# Patient Record
Sex: Male | Born: 1940
Health system: Southern US, Community
[De-identification: ages and names within clinical notes are randomized; demographics above are authoritative.]

## PROBLEM LIST (undated history)

## (undated) ENCOUNTER — Emergency Department (HOSPITAL_COMMUNITY)

## (undated) DIAGNOSIS — R42 Dizziness and giddiness: Secondary | ICD-10-CM

## (undated) DIAGNOSIS — N200 Calculus of kidney: Secondary | ICD-10-CM

## (undated) DIAGNOSIS — I442 Atrioventricular block, complete: Principal | ICD-10-CM

## (undated) DIAGNOSIS — E785 Hyperlipidemia, unspecified: Secondary | ICD-10-CM

## (undated) DIAGNOSIS — I10 Essential (primary) hypertension: Secondary | ICD-10-CM

## (undated) DIAGNOSIS — E119 Type 2 diabetes mellitus without complications: Secondary | ICD-10-CM

## (undated) DIAGNOSIS — Z950 Presence of cardiac pacemaker: Secondary | ICD-10-CM

## (undated) HISTORY — DX: Essential (primary) hypertension: I10

## (undated) HISTORY — PX: PACEMAKER PLACEMENT: SHX43

## (undated) HISTORY — DX: Type 2 diabetes mellitus without complications: E11.9

## (undated) HISTORY — DX: Hyperlipidemia, unspecified: E78.5

## (undated) HISTORY — PX: APPENDECTOMY: SHX54

## (undated) HISTORY — DX: Calculus of kidney: N20.0

## (undated) HISTORY — DX: Presence of cardiac pacemaker: Z95.0

## (undated) HISTORY — DX: Dizziness and giddiness: R42

## (undated) HISTORY — DX: Atrioventricular block, complete: I44.2

---

## 2001-04-18 ENCOUNTER — Ambulatory Visit (HOSPITAL_COMMUNITY): Admission: RE | Admit: 2001-04-18 | Discharge: 2001-04-18 | Payer: Self-pay | Admitting: Gastroenterology

## 2007-06-12 ENCOUNTER — Ambulatory Visit (HOSPITAL_BASED_OUTPATIENT_CLINIC_OR_DEPARTMENT_OTHER): Admission: RE | Admit: 2007-06-12 | Discharge: 2007-06-12 | Payer: Self-pay | Admitting: Orthopedic Surgery

## 2008-04-20 ENCOUNTER — Emergency Department (HOSPITAL_COMMUNITY): Admission: EM | Admit: 2008-04-20 | Discharge: 2008-04-20 | Payer: Self-pay | Admitting: Emergency Medicine

## 2008-08-24 ENCOUNTER — Ambulatory Visit: Payer: Self-pay | Admitting: Cardiology

## 2008-08-24 ENCOUNTER — Inpatient Hospital Stay (HOSPITAL_COMMUNITY): Admission: EM | Admit: 2008-08-24 | Discharge: 2008-08-27 | Payer: Self-pay | Admitting: Emergency Medicine

## 2008-08-25 ENCOUNTER — Encounter: Payer: Self-pay | Admitting: Cardiology

## 2008-09-04 ENCOUNTER — Ambulatory Visit: Payer: Self-pay

## 2008-09-10 ENCOUNTER — Ambulatory Visit: Payer: Self-pay

## 2008-09-10 ENCOUNTER — Encounter: Payer: Self-pay | Admitting: Internal Medicine

## 2008-09-16 ENCOUNTER — Ambulatory Visit: Payer: Self-pay | Admitting: Cardiology

## 2008-11-04 ENCOUNTER — Encounter: Payer: Self-pay | Admitting: Internal Medicine

## 2008-11-04 ENCOUNTER — Ambulatory Visit: Payer: Self-pay | Admitting: Internal Medicine

## 2009-02-16 ENCOUNTER — Encounter: Payer: Self-pay | Admitting: Internal Medicine

## 2009-03-30 ENCOUNTER — Ambulatory Visit: Payer: Self-pay | Admitting: Cardiology

## 2009-03-30 DIAGNOSIS — I442 Atrioventricular block, complete: Secondary | ICD-10-CM | POA: Insufficient documentation

## 2009-03-30 DIAGNOSIS — E663 Overweight: Secondary | ICD-10-CM | POA: Insufficient documentation

## 2009-03-30 HISTORY — DX: Atrioventricular block, complete: I44.2

## 2009-05-13 ENCOUNTER — Ambulatory Visit: Payer: Self-pay

## 2009-05-13 ENCOUNTER — Encounter: Payer: Self-pay | Admitting: Internal Medicine

## 2009-11-10 ENCOUNTER — Ambulatory Visit: Payer: Self-pay | Admitting: Internal Medicine

## 2010-02-16 ENCOUNTER — Encounter: Payer: Self-pay | Admitting: Internal Medicine

## 2010-09-02 NOTE — Letter (Signed)
Summary: Device-Delinquent Phone Journalist, newspaper, Main Office  [address]   [place], [place] [address]   Phone: [phone]  Fax: [phone]     February 16, 2010 MRN: [id number]   Nathan [name] [address] [place], [place]  [address]   Dear Nathan [name],  According to our records, you were scheduled for a device phone transmission on 02-11-2010.     We did not receive any results from this check.  If you transmitted on your scheduled day, please call us to help troubleshoot your system.  If you forgot to send your transmission, please send one upon receipt of this letter.  Thank you,   [facility]

## 2010-09-02 NOTE — Cardiovascular Report (Signed)
Summary: Office Visit   Office Visit   Imported By: Roderic Ovens 11/10/2009 15:15:12  _____________________________________________________________________  External Attachment:    Type:   Image     Comment:   External Document

## 2010-09-02 NOTE — Assessment & Plan Note (Signed)
Summary: pacer check/medtronic   Primary Provider:  Dr. Leslie Dales  CC:  pacer check/medtronic.  History of Present Illness:  [name] is seen in followup for pacemaker implantation for complete hear block undertaken in January 2010.   he has no complaints of lightheadedness or palpitations. The patient denies SOB, chest pain, edema or palpitations   He has a history of diabetes and hypertension  Current Medications (verified): 1)  Metformin Hcl 1000 Mg Tabs (Metformin Hcl) .... Two Times A Day 2)  Lipitor 40 Mg Tabs (Atorvastatin Calcium) .Marland Kitchen.. 1 Tab Once Daily 3)  Januvia 100 Mg Tabs (Sitagliptin Phosphate) .Marland Kitchen.. 1 Tab At Bedtime 4)  Adult Aspirin Ec Low Strength 81 Mg Tbec (Aspirin) .Marland Kitchen.. 1 Tablet Once Daily 5)  Benicar 20 Mg Tabs (Olmesartan Medoxomil) .... Take 1/2 By Mouth Once Daily 6)  Zetia 10 Mg Tabs (Ezetimibe) .... Every Pm 7)  Glimepiride 1 Mg Tabs (Glimepiride) .... Take One Tablet Daily  Allergies (verified): 1)  ! Sulfa 2)  ! * Niaspan  Past History:  Past Medical History: Last updated: 10/29/2008 Diabetes.  Hypertension.  Hyperlipidemia.  Remote history of nephrolithiasis.  History of vertigo.  Heart Block Medtronic ADAPTA dual-chamber  Past Surgical History: Last updated: 03/30/2009 Pacemaker Implantation--08/25/2008 Colonoscopy Appendectomy  Family History: Last updated: 10/29/2008 His mother is alive in her late 92s with no history of  heart disease and his father died in his 30s of cancer but also with no heart disease.  He is an only child.   Social History: Last updated: 10/29/2008 Retired --Semi retired/self-employed Married  Tobacco Use - No.  Alcohol Use - no  Vital Signs:  Patient profile:   70 year old male Height:      72 inches Weight:      215 pounds Pulse rate:   95 / minute Pulse rhythm:   regular BP sitting:   126 / 74  (left arm) Cuff size:   regular  Vitals Entered By: Judithe Modest CMA (November 10, 2009 9:54  AM)  Physical Exam  General:  The patient was alert and oriented in no acute distress. HEENT Normal.  Neck veins were flat, carotids were brisk.  Lungs were clear.  Heart sounds were regular without murmurs or gallops.  Abdomen was soft with active bowel sounds. There is no clubbing cyanosis or edema. Skin Warm and dry    PPM Specifications Following MD:  Sherryl Manges, MD     PPM Vendor:  Medtronic     PPM Model Number:  [id number]     PPM Serial Number:  [id number] PPM DOI:  08/25/2008     PPM Implanting MD:  Sherryl Manges, MD  Lead 1    Location: RA     DOI: 08/25/2008     Model #: [id number]     Serial #: [id number]     Status: active Lead 2    Location: RV     DOI: 08/25/2008     Model #: [id number]     Serial #: [id number]     Status: active  Magnet Response Rate:  BOL 85 ERI 65  Indications:  CHB   PPM Follow Up Remote Check?  No Battery Voltage:  2.79 V     Battery Est. Longevity:  14.5 years     Pacer Dependent:  No       PPM Device Measurements Atrium  Amplitude: 1.0 mV, Impedance: 611 ohms, Threshold: 0.5 V at 0.4 msec Right Ventricle  Amplitude: 16 mV, Impedance:  468 ohms, Threshold: 0.5 V at 0.4 msec Auto Capture ER/Polarity: 11  Episodes MS Episodes:  11     Percent Mode Switch:  <0.1%     Coumadin:  No Ventricular High Rate:  0     Atrial Pacing:  0.2%     Ventricular Pacing:  0.2%  Parameters Mode:  DDD     Lower Rate Limit:  60     Upper Rate Limit:  130 Paced AV Delay:  180     Sensed AV Delay:  150 Next Remote Date:  02/11/2010     Next Cardiology Appt Due:  10/31/2010 Tech Comments:  All mode switch episodes < 1 minute.  No parameter changes.  Device function normal.  Carelink transmissions every 3 months.  ROV 1 year with Dr. Graciela Husbands.  Checked by [employer]. Altha Harm, LPN  November 10, 2009 9:59 AM   Impression & Recommendations:  Problem # 1:  PACEMAKER, PERMANENT MDT (ICD-V45.01) Device parameters and data were reviewed and no changes were made  He is pacing  nearly 0 %   Problem # 2:  AV BLOCK, COMPLETE-INTERMITTENT (ICD-426.0) as noted above His updated medication list for this problem includes:    Adult Aspirin Ec Low Strength 81 Mg Tbec (Aspirin) .Marland Kitchen... 1 tablet once daily  Patient Instructions: 1)  Your physician wants you to follow-up in: 12 months with Dr Logan Bores will receive a reminder letter in the mail two months in advance. If you don't receive a letter, please call our office to schedule the follow-up appointment.

## 2010-11-15 LAB — BASIC METABOLIC PANEL
Calcium: 8.9 mg/dL (ref 8.4–10.5)
Creatinine, Ser: 0.77 mg/dL (ref 0.4–1.5)
GFR calc Af Amer: >60 mL/min (ref >60)

## 2010-11-15 LAB — COMPREHENSIVE METABOLIC PANEL
Albumin: 3.6 g/dL (ref 3.5–5.2)
Alkaline Phosphatase: 53 U/L (ref 39–117)
BUN: 42 mg/dL — ABNORMAL HIGH (ref 6–23)
Creatinine, Ser: 1.36 mg/dL (ref 0.4–1.5)
Glucose, Bld: 133 mg/dL — ABNORMAL HIGH (ref 70–99)
Potassium: 4.5 mEq/L (ref 3.5–5.1)
Total Protein: 5.8 g/dL — ABNORMAL LOW (ref 6.0–8.3)

## 2010-11-15 LAB — CARDIAC PANEL(CRET KIN+CKTOT+MB+TROPI)
CK, MB: 2 ng/mL (ref 0.3–4.0)
CK, MB: 2.6 ng/mL (ref 0.3–4.0)
Relative Index: INVALID (ref 0.0–2.5)
Total CK: 34 U/L (ref 7–232)
Total CK: 54 U/L (ref 7–232)
Total CK: 62 U/L (ref 7–232)
Troponin I: 0.05 ng/mL (ref 0.00–0.06)
Troponin I: 0.08 ng/mL — ABNORMAL HIGH (ref 0.00–0.06)
Troponin I: 0.1 ng/mL — ABNORMAL HIGH (ref 0.00–0.06)

## 2010-11-15 LAB — GLUCOSE, CAPILLARY
Glucose-Capillary: 119 mg/dL — ABNORMAL HIGH (ref 70–99)
Glucose-Capillary: 127 mg/dL — ABNORMAL HIGH (ref 70–99)
Glucose-Capillary: 130 mg/dL — ABNORMAL HIGH (ref 70–99)
Glucose-Capillary: 130 mg/dL — ABNORMAL HIGH (ref 70–99)
Glucose-Capillary: 134 mg/dL — ABNORMAL HIGH (ref 70–99)
Glucose-Capillary: 139 mg/dL — ABNORMAL HIGH (ref 70–99)
Glucose-Capillary: 142 mg/dL — ABNORMAL HIGH (ref 70–99)
Glucose-Capillary: 146 mg/dL — ABNORMAL HIGH (ref 70–99)
Glucose-Capillary: 183 mg/dL — ABNORMAL HIGH (ref 70–99)

## 2010-11-15 LAB — MAGNESIUM: Magnesium: 2.1 mg/dL (ref 1.5–2.5)

## 2010-11-15 LAB — HEMOGLOBIN A1C
Hgb A1c MFr Bld: 6.6 % — ABNORMAL HIGH (ref 4.6–6.1)
Mean Plasma Glucose: 143 mg/dL

## 2010-11-15 LAB — POCT I-STAT, CHEM 8
Calcium, Ion: 1 mmol/L — ABNORMAL LOW (ref 1.12–1.32)
Creatinine, Ser: 1.7 mg/dL — ABNORMAL HIGH (ref 0.4–1.5)
Glucose, Bld: 140 mg/dL — ABNORMAL HIGH (ref 70–99)
HCT: 42 % (ref 39.0–52.0)
Hemoglobin: 14.3 g/dL (ref 13.0–17.0)
Potassium: 4.8 mEq/L (ref 3.5–5.1)
TCO2: 18 mmol/L (ref 0–100)

## 2010-11-15 LAB — LIPID PANEL
HDL: 22 mg/dL — ABNORMAL LOW (ref >39)
Triglycerides: 163 mg/dL — ABNORMAL HIGH (ref <150)
VLDL: 33 mg/dL (ref 0–40)

## 2010-11-15 LAB — CBC
MCHC: 34.1 g/dL (ref 30.0–36.0)
RBC: 4.33 MIL/uL (ref 4.22–5.81)
WBC: 8 10*3/uL (ref 4.0–10.5)

## 2010-11-15 LAB — PROTIME-INR: INR: 1.1 (ref 0.00–1.49)

## 2010-12-14 NOTE — Assessment & Plan Note (Signed)
Nathan [name]                            CARDIOLOGY OFFICE NOTE   Nathan [name], Nathan [name]                      MRN:          [id number]  DATE:09/16/2008                            DOB:          [birth year]    PRIMARY:  Nathan [name], Nathan [name]   REASON FOR PRESENTATION:  Evaluate the patient with heart block.   HISTORY OF PRESENT ILLNESS:  The patient was admitted on August 24, 2008, with presyncope, dizziness, and hypotension.  He was found to have  heart block and required temporary venous pacemaker.  He ruled out for  myocardial infarction.  He subsequently had a permanent pacemaker placed  by Dr. Graciela [name].  This was a Medtronic dual-chamber ADAPTA device.  He did  have an echocardiogram, which demonstrated normal left ventricular  function with some mild diastolic dysfunction.  At the time of this  hospitalization, he was hypotensive and so his Benicar has been held.  He was initially going to start 20 mg a day but again, we held this  after a phone call with him.   He has otherwise been doing fine.  He has had no discomfort other than  some left shoulder discomfort related to an old bursitis.  He has had no  further dizziness.  He has had no presyncope or syncope.  He has had no  shortness of breath.  He has had no palpitations or chest pain.   Of note, the patient did have a followup stress perfusion study in our  office that demonstrated an EF that was normal, with no evidence of  ischemia or infarct.   PAST MEDICAL HISTORY:  Diabetes mellitus, hypertension, hyperlipidemia,  nephrolithiasis.   ALLERGIES:  SULFA.   MEDICATIONS:  Starlix 120 mg q.a.c., metformin 1000 mg b.i.d., Lipitor  40 mg daily, Januvia 100 mg nightly, aspirin 81 mg daily.   REVIEW OF SYSTEMS:  As stated in the HPI and otherwise negative for all  other systems.   PHYSICAL EXAMINATION:  GENERAL:  The patient is pleasant and in no  distress.  VITAL SIGNS:  Blood pressure  132/62, heart rate 68 and regular.  HEENT:  Eyelids unremarkable.  Pupils equal, round, and reactive to  light; fundi not visualized.  Oral mucosa unremarkable.  NECK:  No jugular venous distention at 45 degrees, carotid upstroke  brisk and symmetric, no bruits, no thyromegaly.  LYMPHATICS:  No cervical, axillary, or inguinal adenopathy.  LUNGS:  Clear to auscultation bilaterally.  BACK:  No costovertebral angle tenderness.  CHEST:  Well-healed, left-sided pacemaker pocket.  HEART:  PMI not displaced or sustained, S1 and S2 within normal limits,  no S3, no S4, no clicks, no rubs, no murmurs.  ABDOMEN:  Flat, positive bowel sounds, normal in frequency and pitch, no  bruits, no rebound, no guarding, no midline pulsatile mass.  No  hepatomegaly, no splenomegaly.  SKIN:  No rashes, no nodules.  EXTREMITIES:  Pulses 2+, no edema.   EKG:  Sinus rhythm, rate 86, short PR interval, axis within normal  limits, RSR prime in V1, no acute ST-T wave changes, normal  magnet  function.   ASSESSMENT AND PLAN:  1. The patient is doing well.  He has had no further presyncope or      syncope.  At this point, he will continue to follow up in our      Pacemaker Clinic.  He is due to see Dr. Graciela [name] in April for      followup.  Given the fact that he has had no further heart problems      identified, he could follow with Dr. Graciela [name] after this.  2. Hypotension.  His blood pressure is starting to creep back up.  I      have asked him to restart Benicar at 10 mg daily.  This can then be      followed by Dr. Leslie [name].  3. Dyslipidemia.  I did review this with him.  He had some questions      about the Zetia and we reviewed this.  He is not currently taking      the Zetia.  He does have an LDL within the hospital, was at an      acceptable target of 71.  However, he has a very low HDL of 22.      His triglycerides were slightly elevated at 163.  We discussed the      potential benefits of raising his HDL in  combination therapy.      However, he is followed by a good internist and so I will defer the      management.  He will go back and talk to Dr. Leslie [name] about this.  4. Diabetes.  Hemoglobin A1c was 6.6 in the hospital.  He will follow      up with Dr. Leslie [name].  5. Followup.  I have scheduled him for 6 months though would be happy      to defer his management to Dr. Graciela [name].  He will follow with Dr.      Leslie [name].     Nathan [name], Nathan [name], [name]  Electronically Signed    JH/MedQ  DD: 09/16/2008  DT: 09/17/2008  Job #: [id number]   cc:   Nathan [name], M.D.

## 2010-12-14 NOTE — Op Note (Signed)
NAMEGURTEJ, NOYOLA NO.:  [id number]   MEDICAL RECORD NO.:  [id number]          PATIENT TYPE:  INP   LOCATION:  2911                         FACILITY:  [facility]   PHYSICIAN:  Verne Carrow, MDDATE OF BIRTH:  1940-12-25   DATE OF PROCEDURE:  08/24/2008  DATE OF DISCHARGE:                               OPERATIVE REPORT   PRIMARY CARDIOLOGIST:  Rollene Rotunda, MD, [facility]   PROCEDURE PERFORMED:  Placement of a transvenous temporary pacemaker in  the right femoral vein.   OPERATOR:  Verne Carrow, MD   INDICATIONS:  A 70 year old male who presented to the emergency  department with complete heart block.   DETAILS OF PROCEDURE:  The patient was brought to the Inpatient Heart  Catheterization Laboratory after signing informed consent for the  procedure.  The right groin was prepped and draped in the sterile  fashion.  Lidocaine 1% was used for local anesthesia.  A 6-French sheath  was placed in the right femoral vein without difficulty.  A transvenous  temporary pacemaker was inserted through the sheath in the right femoral  vein and advanced into the right ventricle.  Capture was obtained easily  and the ventricular pacing rate was set at 70 beats per minute.  The  patient tolerated the procedure well and was taken to the CCU in stable  condition.   HEMODYNAMIC DATA:  Blood pressure 141/73, heart rate at time of starting  the procedure was 27 beats per minute.  At the end of the procedure, he  was pacing at 75 beats per minute.   IMPRESSION:  Successful placement of a temporary transvenous pacemaker  in the right ventricle.   RECOMMENDATIONS:  The patient will be admitted to the CCU and monitored  closely with continued transvenous pacing.  We will make plans for  placement of a permanent pacemaker tomorrow.  Further admission plans  per Dr. Antoine Poche.      Verne Carrow, MD  Electronically Signed     CM/MEDQ  D:  08/24/2008  T:   08/24/2008  Job:  [id number]

## 2010-12-14 NOTE — H&P (Signed)
NAMECASANOVA, [name] NO.:  [id number]   MEDICAL RECORD NO.:  [id number]          PATIENT TYPE:  INP   LOCATION:  2911                         FACILITY:  [facility]   PHYSICIAN:  Nathan [name], FACCDATE OF BIRTH:  Aug 22, 1940   DATE OF ADMISSION:  08/24/2008  DATE OF DISCHARGE:                              HISTORY & PHYSICAL   PRIMARY CARE PHYSICIAN:  Nathan Fells. [place], [name]   PRIMARY CARDIOLOGIST:  None.   CHIEF COMPLAINT:  Bradycardia.   HISTORY OF PRESENT ILLNESS:  Nathan [name] is a 70 year old male with no  previous history of coronary artery disease.  He has complained of some  dizziness over the last 2-3 days.  Today, he felt very weak and was  orthostatic.  He would get dizzy and short of breath when he tried to  sit up.  He also complained of chest pain he describes as a pressure  that has been intermittent for the last 2-3 days.  When he was unable to  sit up unaided, his wife insisted on calling 911.  His heart rate was  noted to be in the 20s.  He was transported urgently to [facility].  The EKG demonstrates complete heart block with a slow  ventricular response.  Currently, in the emergency room, he is on O2 and  receiving IV fluids.  Other than weakness, he has no complaints at this  time.   PAST MEDICAL HISTORY:  1. Diabetes.  2. Hypertension.  3. Hyperlipidemia.  4. Remote history of nephrolithiasis.  5. History of vertigo.   SURGICAL HISTORY:  He is status post left rotator cuff repair and  removal of adhesions.  He has had a colonoscopy and an appendectomy.   ALLERGIES:  SULFA.   CURRENT MEDICATIONS:  1. Aspirin 81 mg a day.  2. Zetia 10 mg a day.  3. Lipitor 80 mg a day.  4. Metformin 1000 mg b.i.d.  5. Starlix 120 mg t.i.d. a.c.  6. Meclizine p.r.n.  7. Benicar 40 mg a day.  8. Januvia 100 mg a day.   SOCIAL HISTORY:  He lives in [place] with his wife and semi-retired  but currently self-employed.  There is no  history of alcohol, tobacco or  drug abuse.   FAMILY HISTORY:  His mother is alive in her late 36s with no history of  heart disease and his father died in his 38s of cancer but also with no  heart disease.  He is an only child.   REVIEW OF SYSTEMS:  He has weakness.  He has had dyspnea on exertion as  well as chest pain.  There has been some presyncope.  He has not had  fevers, chills or sweats.  He has not had any melena.  Full 14 point  review of systems is otherwise negative.   PHYSICAL EXAMINATION:  VITAL SIGNS:  Blood pressure 109/62, pulse 22,  respiratory rate 18, O2 saturation 100% on a non-rebreather.  GENERAL:  He is well-developed, well-nourished white male in no acute  distress at rest on O2 and IV fluids.  HEENT:  Normal.  NECK:  There is no lymphadenopathy, thyromegaly, bruit or JVD noted.  CV:  His heart is slow but regular in rate and rhythm with an S1-S2 and  no significant murmur, rub or gallop is noted.  Distal pulses are intact  in all four extremities and no femoral bruits are appreciated.  LUNGS:  Clear to auscultation bilaterally.  SKIN:  No rashes or lesions are noted.  ABDOMEN:  Soft and nontender with active bowel sounds.  EXTREMITIES:  There is no cyanosis, clubbing or edema noted.  MUSCULOSKELETAL:  There is no joint deformity or effusions and no spine  or CVA tenderness.  NEURO:  He is alert and oriented with cranial nerves II-XII grossly  intact.   Chest x-ray shows low lung volumes but no acute process.   EKG is complete heart block, rate 22 with no acute ischemic changes.   LABORATORY VALUES:  Hemoglobin is 14.3, hematocrit 42.  Sodium 139,  potassium 4.8, chloride 114, BUN 54, creatinine 1.7, glucose 140.  Initial point-of-care markers are negative.   IMPRESSION:  [name] was seen today by Dr. Antoine Poche.  He has  presyncope with complete heart block.  He has multiple cardiac risk  factors including diabetes, hypertension and hyperlipidemia.   He is  unstable as he became hypotensive in the emergency room.  He needs a  temporary pacer.  Dr. Myrene Galas is aware and will do this urgently  in the Catheterization Laboratory.  He will go to cardiac care unit.  Of  note, his heart rate did not respond to atropine 1 mg.  He will need a  permanent pacer in a.m.  In regards to his diabetes,  he will be  continued on his current medications but we will hold metformin in case  he needs any study containing dye.  His Benicar will be held because of  his hypotension and restarted as his blood pressure will tolerate.  We  will check a lipid profile and an A1c.      Nathan Demark, PA-C      Nathan [name], [facility]  Electronically Signed    RB/MEDQ  D:  08/24/2008  T:  08/24/2008  Job:  [id number]

## 2010-12-14 NOTE — Op Note (Signed)
NAMEVERN, GUERETTE NO.:  [id number]   MEDICAL RECORD NO.:  [id number]          PATIENT TYPE:  AMB   LOCATION:  DSC                          FACILITY:  [facility]   PHYSICIAN:  Robert A. Thurston Hole, M.D. DATE OF BIRTH:  30-Apr-1941   DATE OF PROCEDURE:  06/12/2007  DATE OF DISCHARGE:  06/12/2007                               OPERATIVE REPORT   PREOPERATIVE DIAGNOSIS:  Left shoulder adhesive capsulitis five months  status post rotator cuff repair.   POSTOPERATIVE DIAGNOSIS:  Left shoulder adhesive capsulitis five months  status post rotator cuff repair.   PROCEDURE:  Left shoulder examination under anesthesia followed by  manipulation with arthroscopic lysis of adhesions.   SURGEON:  Elana Alm. Thurston Hole, M.D.   ASSISTANT:  Julien Girt, P.A.-C.   ANESTHESIA:  General.   OPERATIVE TIME:  45 minutes.   COMPLICATIONS:  None.   INDICATIONS FOR PROCEDURE:  Mr. Sibert is a 70 year old gentleman who  had undergone a previous rotator cuff repair approximately six months  previously.  He has had persistent pain with loss of motion despite  aggressive physical therapy and is now to undergo left shoulder EUA  manipulation and arthroscopic lysis of adhesions.   DESCRIPTION:  Mr. Bouillon was brought to the operating room on June 12, 2007, and placed on the operating table in a supine position.  After  being placed under general anesthesia, his left shoulder was examined.  Initial range of motion showed forward flexion to 150, abduction 140,  internal and external rotation of 50 degrees.  A gentle manipulation was  carried out breaking up the adhesions and improving forward flexion to  170, abduction to 170, internal and external rotation of 80 degrees.  The shoulder remained stable to ligamentous exam.  He was then placed in  a beach chair position and his shoulder and arm was prepped using  sterile DuraPrep and draped using sterile technique.  He received Ancef  1 gram IV preoperatively for prophylaxis.  Initially, the arthroscopy  was performed through a posterior arthroscopic portal and the  arthroscope with a pump attached was placed into an anterior portal and  an arthroscopic probe was placed.  On initial inspection, the articular  cartilage in the glenohumeral joint showed 25-30% grade 3 chondromalacia  which was debrided.  The anterior and superior labrum was intact.  There  was erythematous synovitis and adhesions intra-articularly which were  partially debrided and cauterized.  The anterior inferior labrum and  anterior inferior glenohumeral ligament complex was intact.  The biceps  tendon anchor and biceps tendon was intact.  The posterior labrum showed  moderate fraying which was debrided but it was, otherwise, intact.  The  previous rotator cuff repair was found to be satisfactorily repaired and  healed and no signs of a re-tear, the sutures were well visualized.  The  inferior capsular recess showed erythematous synovitis which was  partially debrided and cauterized, but no other pathology.  The  subacromial space was entered and a lateral arthroscopic portal was  made.  There was significant subacromial adhesions which was thoroughly  debrided  but the rotator cuff repair was intact and well visualized.  The previous subacromial decompression was found to be satisfactory as  was his previous distal clavicle excision.  After this was done, the  shoulder was brought through a full range of motion with no impingement  noted.  At this point, it was felt that all pathology had been  satisfactorily addressed.  The instruments were removed.  The portals  were closed with 3-0 nylon suture.  Sterile dressings and a sling were  applied.  The patient was awakened and taken to recovery in stable  condition.   FOLLOW UP CARE:  Mr. Krider will be followed as an outpatient on  Percocet and Robaxin.  He will be seen back in the office in a week  for  sutures out and follow up.      Robert A. Thurston Hole, M.D.  Electronically Signed     RAW/MEDQ  D:  10/11/2007  T:  10/11/2007  Job:  [id number]

## 2010-12-14 NOTE — Op Note (Signed)
NAME:  [name], [name] NO.:  [id number]   MEDICAL RECORD NO.:  [id number]          PATIENT TYPE:  INP   LOCATION:  2911                         FACILITY:  [facility]   PHYSICIAN:  Duke Salvia, MD, FACCDATE OF BIRTH:  17-Aug-1940   DATE OF PROCEDURE:  08/25/2008  DATE OF DISCHARGE:                               OPERATIVE REPORT   [name] is a 70 year old gentleman with diabetes and hypertension  who presented to the hospital with weakness and shortness of breath and  he was found to have a heart rate in the 30s and complete heart block.  He was admitted for temporary transvenous pacing and he is now brought  to the Electrophysiology Laboratory for pacemaker implantation.   PREOPERATIVE DIAGNOSIS:  Complete heart block with intermittent two to  one high-grade block.   POSTOPERATIVE DIAGNOSIS:  Complete heart block with intermittent two to  one high-grade block.   PROCEDURE:  Dual-chamber pacemaker implantation.   Following obtaining informed consent, the patient was brought to  Electrophysiologic Laboratory and placed on the fluoroscopic table in a  supine position.  After routine prep and drape of the left upper chest,  lidocaine was infiltrated into the prepectoral subclavicular region.  An  incision was made and carried down to the layer of the prepectoral  fascia with electrocautery.  With sharp dissection, a pocket was formed  similarly.  Hemostasis was obtained.   Thereafter, attention was turned gaining access to the extrathoracic  left subclavian vein which was accomplished without difficulty without  the aspiration of air or puncture of the artery.  Two separate  venipunctures were accomplished.  Guidewires were placed and retained.   Sequentially, 7-French sheaths were placed through which were passed a  Medtronic 5076 58-cm active fixation ventricular lead serial number  [id number] and the 5076 52-cm active fixation atrial lead serial number  [id number].  The ventricular lead was marked with a tie.  The leads were  then manipulated to the right ventricular septum and the right atrial  appendage respectively where the bipolar R-wave was 12 with a pace  impedance of 924 ohms, a threshold of 0.8 volts at 0.5 milliseconds.  Current threshold was 0.8 mA.  There was no diaphragmatic pacing at 10  volts and the current of injury was brisk.   The bipolar P-wave was 4 with the pace impedance of 1314 ohms, the  threshold of 0.6 volts at 0.5 milliseconds.  Shortly after this lead  deployment, current threshold is 1.5 mA.  Again there was no  diaphragmatic pacing at 10 volts and the current of injury was brisk.  These leads were then secured to the prepectoral fascia and attached to  a Medtronic Adapta ADDRL pulse generator, serial number [id number].  Ventricular pacing and a P-synchronous pacing were identified.  The  pocket was copiously irrigated with antibiotic containing saline  solution.  Hemostasis was assured.  The leads and the pulse generator  were placed in the pocket and secured to the prepectoral fascia.  The  wound was closed in three layers in a normal fashion.  The wound was  washed, dried and a benzoin and Steri-Strip dressing was applied.  Needle counts, sponge counts and instrument counts were correct at the  end of the procedure according to the staff.  The patient tolerated the  procedure without apparent complication.      Duke Salvia, MD, [facility]  Electronically Signed     SCK/MEDQ  D:  08/25/2008  T:  08/25/2008  Job:  [id number]

## 2010-12-14 NOTE — Assessment & Plan Note (Signed)
[facility]                         ELECTROPHYSIOLOGY OFFICE NOTE   MARLEE, TRENTMAN                      MRN:          [id number]  DATE:11/04/2008                            DOB:          1940/12/09    [name] is seen in followup for pacemaker implanted for intermittent  complete heart block in January.  He has had no significant symptoms.  His dizziness and weakness that he was having with that was improved  with down titration of his Benicar.   MEDICATIONS:  1. Starlix 20.  2. Metformin 1000 twice a day.  3. Lipitor 40.  4. Januvia 100.  5. Aspirin.  6. Benicar 10.   PHYSICAL EXAMINATION:  VITAL SIGNS:  His blood pressure is 112/72 with a  pulse of 88 and his weight was 203.  LUNGS:  Clear.  NECK:  Veins were flat.  Device pocket was well-healed.  HEART:  Sounds were regular.  ABDOMEN:  Soft.  EXTREMITIES:  Without edema.   Interrogation of his Medtronic pulse generator demonstrates a P-wave of  1.4, the impedance was 6.4, and the threshold was 0.25 at 0.4.  The R-  wave was 22.4, the impedance was 511, and the threshold 0.5 at 0.4.  The  device was reprogrammed to maximize longevity.   IMPRESSION:  1. Intermittent complete heart block.  2. Status post pacer for the above.  3. Diabetes.   [name] is doing fine at this point from arrhythmia point-of-view.  We will plan to see him again in 9 months' time at which time, he will  begin remote followup.     Duke Salvia, MD, [facility]  Electronically Signed    SCK/MedQ  DD: 11/04/2008  DT: 11/04/2008  Job #: [id number]   cc:   Veverly Fells. Altheimer, M.D.

## 2010-12-14 NOTE — Discharge Summary (Signed)
Nathan [name], Nathan [name] NO.:  [id number]   MEDICAL RECORD NO.:  [id number]          PATIENT TYPE:  INP   LOCATION:  2011                         FACILITY:  [facility]   PHYSICIAN:  Duke Salvia, MD, FACCDATE OF BIRTH:  Mar 09, 1941   DATE OF ADMISSION:  08/24/2008  DATE OF DISCHARGE:  08/27/2008                               DISCHARGE SUMMARY   ALLERGIES:  This patient has allergy to SULFA.   Time for this dictation is greater than 45 minutes including examination  as well.   FINAL DIAGNOSES:  1. Discharged on day #2, status post implant of a Medtronic dual      chamber pacemaker.  2. Admitted with 2-3 days history of weakness/dizziness/chest      pressure.      a.     Complete heart block on admission status post implant of       temporary pacemaker right femoral vein access, Dr. Clifton Bernhardt.      b.     The patient is now in sinus rhythm with native conduction.  3. Further workup includes a CT of the chest, which shows no evidence      of lymphadenopathy and no other acute findings.  Also, antinuclear      antibodies were negative.  ACE converting enzyme study is pending      at the time of discharge.   SECONDARY DIAGNOSES:  1. Diabetes.  2. Hypertension.  3. Dyslipidemia.  4. History of vertigo.  5. History of nephrolithiasis.   PROCEDURES THIS ADMISSION:  1. Temporary pacemaker with implantation, right femoral vein access      Dr. Clifton Darral for complete heart block.  This was done on August 24, 2008.  2. A 2-D echocardiogram on August 25, 2008, left ventricular function      is normal, moderate diastolic dysfunction, no mitral regurgitation,      no tricuspid regurgitation, right ventricular systolic function      normal.  3. On August 25, 2008, implant of a Medtronic ADAPTA dual-chamber      pacemaker Dr. Duke Salvia.   BRIEF HISTORY:  Nathan [name] is a 70 year old male.  He has no previous  history of coronary artery disease.  He has  complained of dizziness over  the last 2-3 days.  On the day of admission on August 24, 2008, he felt  weak and was orthostatic.  He would get dizzy and short of breath just  trying to sit up from the bed.  He also complained of chest pain which  he describes as a pressure.  It has been intermittent for the last 2-3  days.   When the patient was unable to sit up unaided, his wife insisted on  calling 911.  When emergency medical service arrived at the house, his  heart rate was in the 20s.  He was transported urgently to [facility].  Electrocardiogram shows complete heart block with slow  ventricular response.  In the emergency room up until the time of this  examination, he is receiving oxygen and  IV fluids.  He has no complaints  of weakness at this time.   He needs a temporary pacer.  Dr. Clifton Danyl will do this in the cath  laboratory and he will then go to the cardiac intensive care unit.  Of  note, his heart did not respond to atropine 1 mg.  The patient will need  a permanent pacemaker in 24 hours.   As regard to his diabetes, he will be continued on current medications,  although his metformin will be held in case he needs any study  containing dye.  Benicar will also be held because of hypotension.   HOSPITAL COURSE:  The patient presents by way of EMS on August 24, 2008, to the emergency room.  He was found to be weak, dizzy with chest  pressure.  His heart rate is in the 20s.  It is a ventricular escape.  The patient is in complete heart block.  At the time of his admission,  he received a temporary pacemaker from the right femoral vein access by  Dr. Clifton Sartaj the same day as his admission.  The next day he was  scheduled for permanent pacemaker.  This was implanted by Dr. Duke Salvia.  A Medtronic dual chamber ADAPTA device was implanted without  complication.  The patient has had no postprocedural hematoma.  His  chest x-ray shows that the leads are in  appropriate position.  Interrogational device shows that the valves are within normal limits.  In addition, the patient has had an echocardiogram which showed that his  left ventricular function was normal.  This was done on August 25, 2008.  He has moderate diastolic dysfunction.  No mitral regurgitation  and no tricuspid regurgitation.  He also had a CT of the chest to rule  out lymphadenopathy.  The CT study was negative for lymphadenopathy and  no other acute findings were discovered.  The patient's TSH was 1.065,  antinuclear antibody study came back negative.  His ACE converting  enzyme study is pending.  Hemoglobin A1c was 6.6.  His troponin I study  was 0.05, then 0.10, then 0.08.  The patient reports that his weakness,  fatigue, as well as dizziness have all abated since implantation of the  pacemaker.  In fact on the afternoon of pacemaker implantation on  August 25, 2008, at about 1848 hours, the patient did no longer require  A sensing and V pacing but actually reverted to sinus rhythm and he has  maintained sinus rhythm for the 36 hours that have preceded this  discharge through August 25, 2008, August 26, 2008, and now the  morning of August 27, 2008, all sinus rhythm.  The patient will have a  follow up stress test at [facility] as well as other  appointments pertinent to pacemaker implantation.  He was asked to keep  his incision dry for the next 6 days and to sponge bathe until Monday,  September 01, 2008.   MEDICATIONS:  1. Enteric-coated aspirin 81 mg daily.  2. Zetia 10 mg daily.  3. Lipitor 80 mg daily at bedtime.  4. Januvia 100 mg daily.  5. Starlix 120 mg 3 times daily.  6. Benicar 20 mg daily.  This is a new dose.  The patient presented      with hypotension.  7. Metformin 1000 mg twice daily.  He was asked to restart metformin      only on Thursday, August 28, 2008.  8.  For pain, Vicodin 5/500 1-2 times every 4-6 hours as needed.   He follows  up at the [facility], [address]  [place].  1. Stress test, Thursday, September 04, 2008, at 9 o'clock.  He was      asked to eat nothing after midnight Wednesday, September 03, 2008.  2. Pacer Clinic on Wednesday, September 10, 2008, at 9:20 to check the      incision.  3. To see Dr. Antoine Poche to follow up the stress test Tuesday, September 16, 2008, 3 o'clock.  4. To see Dr. Graciela Husbands.  His office will call with appointment in April.   LABORATORY STUDIES THIS ADMISSION:  Complete blood count on the day of  discharge, August 27, 2008, hemoglobin 13.8, hematocrit 40.5, white  cell 8, platelets of 169.  Serum electrolytes, on the day of discharge,  sodium 141, potassium 4.1, chloride 105, carbonate 25, BUN is 10,  creatinine 0.77, glucose 96.  Protime this  admission is 14.6, INR 1.1, alkaline phosphatase 53, SGOT 19, SGPT is  32.  Once again, troponin I study is 0.05, then 0.10, 0.08.  Magnesium  at this admission was 2.1.  Antinuclear antibody negative.  TSH 1.065,  hemoglobin A1c 6.6.      Maple Mirza, [place]      Duke Salvia, MD, [facility]  Electronically Signed    GM/MEDQ  D:  08/27/2008  T:  08/28/2008  Job:  [id number]   cc:   Veverly Fells. Altheimer, M.D.

## 2010-12-17 NOTE — Procedures (Signed)
[facility]  Patient:    Nathan [name], Nathan [name] Visit Number: [id number] MRN: [id number]          Service Type: END Location: ENDO Attending Physician:  Rich Brave Dictated by:   Florencia Reasons, M.D. Proc. Date: 04/18/01 Admit Date:  04/18/2001   CC:         Veverly Fells. Altheimer, M.D.   Procedure Report  PROCEDURE:  Colonoscopy.  ENDOSCOPIST:  Florencia Reasons, M.D.  INDICATION:  Family history of colon cancer in a 70 year old diabetic.  FINDINGS:  Normal exam to the terminal ileum.  DESCRIPTION OF PROCEDURE:  The nature, purpose and risks of the procedure were familiar to the patient from prior examination and he provided written consent.  Sedation was Fentanyl 62.5 mcg and Versed 8 mg IV, without arrhythmias or desaturation.  The Olympus standard pediatric video colonoscope was advanced to the terminal ileum.  There was quite a bit of angulation in the right colon and actually we thought we had reached the cecum initially, when indeed we had not, but since I did not see the appendiceal orifice or ileocecal valve and I kept looking behind a fold, we were able to find additional lumen and advance then into the cecum and, in fact, into the terminal ileum, which had a normal appearance.  Pullback was then performed. The quality of the prep was quite good and it was felt that all areas were adequately seen.  This was a normal examination.  No polyps, cancer, colitis, vascular malformations or diverticulosis were observed and retroflexion in the rectum was grossly normal.  Digital exam of the prostate at the start of the exam was normal although, as previously noted, the patient had some degree of anal stenosis, or at least hypertrophy, of the pelvic muscles such that it was a tight squeeze getting the finger or the scope inserted.  The patient tolerated this procedure well and there were no apparent complications.  No biopsies were  obtained.  IMPRESSION:  Normal colonoscopy.    PLAN:  Followup exam in five years because of the family history of colon cancer. Dictated by:   Florencia Reasons, M.D. Attending Physician:  Rich Brave DD:  04/18/01 TD:  04/18/01 Job: [id number]

## 2011-03-24 ENCOUNTER — Encounter: Payer: Self-pay | Admitting: Internal Medicine

## 2011-05-02 LAB — GLUCOSE, CAPILLARY

## 2011-05-09 ENCOUNTER — Encounter: Payer: Self-pay | Admitting: Internal Medicine

## 2011-05-09 ENCOUNTER — Encounter: Payer: Self-pay | Admitting: *Deleted

## 2011-05-10 ENCOUNTER — Ambulatory Visit (INDEPENDENT_AMBULATORY_CARE_PROVIDER_SITE_OTHER): Payer: Medicare Other | Admitting: Internal Medicine

## 2011-05-10 ENCOUNTER — Encounter: Payer: Self-pay | Admitting: Internal Medicine

## 2011-05-10 DIAGNOSIS — Z950 Presence of cardiac pacemaker: Secondary | ICD-10-CM

## 2011-05-10 DIAGNOSIS — I442 Atrioventricular block, complete: Secondary | ICD-10-CM

## 2011-05-10 HISTORY — DX: Presence of cardiac pacemaker: Z95.0

## 2011-05-10 LAB — PACEMAKER DEVICE OBSERVATION
AL IMPEDENCE PM: 573 Ohm
BATTERY VOLTAGE: 2.79 V
RV LEAD AMPLITUDE: 22.4 mv
VENTRICULAR PACING PM: 0

## 2011-05-10 LAB — BASIC METABOLIC PANEL
BUN: 12
Chloride: 103
Potassium: 4.1
Sodium: 136

## 2011-05-10 LAB — POCT HEMOGLOBIN-HEMACUE
Hemoglobin: 14.2
Operator id: 128471

## 2011-05-10 NOTE — Assessment & Plan Note (Signed)
Stable post pacing; he is paced he almost never

## 2011-05-10 NOTE — Assessment & Plan Note (Signed)
The patient's device was interrogated.  The information was reviewed. No changes were made in the programming.    

## 2011-05-10 NOTE — Progress Notes (Signed)
  HPI  Nathan [name] is a 70 y.o. male seen in followup for pacemaker implantation for intermittent complete hear block undertaken in January 2010. he has no complaints of lightheadedness or palpitations. The patient denies SOB, chest pain, edema or palpitations   Hemoglobin A1c is down to 5.8. (hooray  Past Medical History  Diagnosis Date  . DM (diabetes mellitus)   . HTN (hypertension)   . Hyperlipidemia   . Nephrolithiasis   . Vertigo   . Heart block     Past Surgical History  Procedure Date  . Pacemaker placement   . Appendectomy     Current Outpatient Prescriptions  Medication Sig Dispense Refill  . aspirin 81 MG tablet Take 81 mg by mouth daily.        Marland Kitchen atorvastatin (LIPITOR) 80 MG tablet Take 40 mg by mouth daily.       Marland Kitchen DIOVAN 160 MG tablet Take 160 mg by mouth daily.       Marland Kitchen glimepiride (AMARYL) 1 MG tablet Take 1 mg by mouth daily before breakfast.        . HYDROcodone-acetaminophen (NORCO) 5-325 MG per tablet Take 1 tablet by mouth every 8 (eight) hours as needed.       Marland Kitchen ibuprofen (ADVIL,MOTRIN) 400 MG tablet       . JANUVIA 100 MG tablet Take 100 mg by mouth daily.       . metFORMIN (GLUCOPHAGE) 1000 MG tablet Take 1,000 mg by mouth 2 (two) times daily with a meal.        . triamcinolone (KENALOG) 0.1 % cream Apply 1 application topically as needed.       Marland Kitchen ZETIA 10 MG tablet Take 10 mg by mouth daily.         Allergies  Allergen Reactions  . Niacin     REACTION: flushing  . Sulfonamide Derivatives     Review of Systems negative except from HPI and PMH  Physical Exam Well developed and well nourished in no acute distress HENT normal E scleral and icterus clear Neck Supplel Clear to ausculation Regular rate and rhythm, no murmurs gallops or rub Soft with active bowel sounds No clubbing cyanosis and edema Alert and oriented, grossly normal motor and sensory function Skin Warm and Dry    Assessment and  Plan

## 2011-05-10 NOTE — Patient Instructions (Signed)
Your physician wants you to follow-up in: 1 year with Dr. Klein. You will receive a reminder letter in the mail two months in advance. If you don't receive a letter, please call our office to schedule the follow-up appointment.  Your physician recommends that you continue on your current medications as directed. Please refer to the Current Medication list given to you today.  

## 2011-08-11 ENCOUNTER — Encounter: Payer: Medicare Other | Admitting: *Deleted

## 2011-08-12 DIAGNOSIS — M25559 Pain in unspecified hip: Secondary | ICD-10-CM | POA: Diagnosis not present

## 2011-08-12 DIAGNOSIS — S335XXA Sprain of ligaments of lumbar spine, initial encounter: Secondary | ICD-10-CM | POA: Diagnosis not present

## 2011-08-15 ENCOUNTER — Encounter: Payer: Self-pay | Admitting: *Deleted

## 2011-11-21 DIAGNOSIS — E1149 Type 2 diabetes mellitus with other diabetic neurological complication: Secondary | ICD-10-CM | POA: Diagnosis not present

## 2011-11-21 DIAGNOSIS — E785 Hyperlipidemia, unspecified: Secondary | ICD-10-CM | POA: Diagnosis not present

## 2011-11-21 DIAGNOSIS — H3500 Unspecified background retinopathy: Secondary | ICD-10-CM | POA: Diagnosis not present

## 2011-11-21 DIAGNOSIS — E1139 Type 2 diabetes mellitus with other diabetic ophthalmic complication: Secondary | ICD-10-CM | POA: Diagnosis not present

## 2011-11-21 DIAGNOSIS — E1142 Type 2 diabetes mellitus with diabetic polyneuropathy: Secondary | ICD-10-CM | POA: Diagnosis not present

## 2011-11-22 DIAGNOSIS — E1169 Type 2 diabetes mellitus with other specified complication: Secondary | ICD-10-CM | POA: Diagnosis not present

## 2011-11-22 DIAGNOSIS — E1149 Type 2 diabetes mellitus with other diabetic neurological complication: Secondary | ICD-10-CM | POA: Diagnosis not present

## 2011-11-22 DIAGNOSIS — I10 Essential (primary) hypertension: Secondary | ICD-10-CM | POA: Diagnosis not present

## 2011-11-22 DIAGNOSIS — E785 Hyperlipidemia, unspecified: Secondary | ICD-10-CM | POA: Diagnosis not present

## 2011-12-05 ENCOUNTER — Telehealth: Payer: Self-pay | Admitting: Internal Medicine

## 2011-12-05 NOTE — Telephone Encounter (Signed)
12-05-11 lmm for pt to send in remote pacer, missed appt 08-11-11/mt

## 2011-12-26 ENCOUNTER — Encounter: Payer: Self-pay | Admitting: *Deleted

## 2012-01-09 DIAGNOSIS — J029 Acute pharyngitis, unspecified: Secondary | ICD-10-CM | POA: Diagnosis not present

## 2012-03-27 ENCOUNTER — Encounter: Payer: Self-pay | Admitting: *Deleted

## 2012-04-11 DIAGNOSIS — R1310 Dysphagia, unspecified: Secondary | ICD-10-CM | POA: Diagnosis not present

## 2012-04-12 DIAGNOSIS — E1149 Type 2 diabetes mellitus with other diabetic neurological complication: Secondary | ICD-10-CM | POA: Diagnosis not present

## 2012-04-12 DIAGNOSIS — E785 Hyperlipidemia, unspecified: Secondary | ICD-10-CM | POA: Diagnosis not present

## 2012-04-16 DIAGNOSIS — E785 Hyperlipidemia, unspecified: Secondary | ICD-10-CM | POA: Diagnosis not present

## 2012-04-16 DIAGNOSIS — E1149 Type 2 diabetes mellitus with other diabetic neurological complication: Secondary | ICD-10-CM | POA: Diagnosis not present

## 2012-04-16 DIAGNOSIS — I10 Essential (primary) hypertension: Secondary | ICD-10-CM | POA: Diagnosis not present

## 2012-04-16 DIAGNOSIS — E1142 Type 2 diabetes mellitus with diabetic polyneuropathy: Secondary | ICD-10-CM | POA: Diagnosis not present

## 2012-04-16 DIAGNOSIS — Z23 Encounter for immunization: Secondary | ICD-10-CM | POA: Diagnosis not present

## 2012-05-24 ENCOUNTER — Encounter: Payer: Self-pay | Admitting: *Deleted

## 2012-05-31 DIAGNOSIS — E119 Type 2 diabetes mellitus without complications: Secondary | ICD-10-CM | POA: Diagnosis not present

## 2012-06-01 ENCOUNTER — Ambulatory Visit (INDEPENDENT_AMBULATORY_CARE_PROVIDER_SITE_OTHER): Payer: Medicare Other | Admitting: Internal Medicine

## 2012-06-01 ENCOUNTER — Encounter: Payer: Self-pay | Admitting: Internal Medicine

## 2012-06-01 VITALS — BP 126/78 | HR 80 | Ht 72.0 in | Wt 205.0 lb

## 2012-06-01 DIAGNOSIS — I442 Atrioventricular block, complete: Secondary | ICD-10-CM

## 2012-06-01 DIAGNOSIS — Z950 Presence of cardiac pacemaker: Secondary | ICD-10-CM

## 2012-06-01 LAB — PACEMAKER DEVICE OBSERVATION
AL AMPLITUDE: 2.8 mv
AL THRESHOLD: 0.5 V
BAMS-0001: 175 {beats}/min
RV LEAD AMPLITUDE: 22.4 mv
RV LEAD IMPEDENCE PM: 431 Ohm
RV LEAD THRESHOLD: 0.75 V
VENTRICULAR PACING PM: 0

## 2012-06-01 NOTE — Progress Notes (Signed)
  HPI  Nathan [name] is a 71 y.o. male seen in followup for pacemaker implantation for intermittent complete hear block undertaken in January 2010. he has no complaints of lightheadedness or palpitations. The patient denies SOB, chest pain, edema     He has just taken custody of 55 yo grandson     Past Medical History  Diagnosis Date  . DM (diabetes mellitus)   . HTN (hypertension)   . Hyperlipidemia   . Nephrolithiasis   . Vertigo   . Heart block     Past Surgical History  Procedure Date  . Pacemaker placement   . Appendectomy     Current Outpatient Prescriptions  Medication Sig Dispense Refill  . aspirin 81 MG tablet Take 81 mg by mouth daily.        Marland Kitchen DIOVAN 160 MG tablet Take 160 mg by mouth daily.       Marland Kitchen glimepiride (AMARYL) 1 MG tablet Take 1 mg by mouth 2 (two) times daily.       Marland Kitchen HYDROcodone-acetaminophen (NORCO) 5-325 MG per tablet Take 1 tablet by mouth every 8 (eight) hours as needed.       Marland Kitchen ibuprofen (ADVIL,MOTRIN) 400 MG tablet       . metFORMIN (GLUCOPHAGE) 1000 MG tablet Take 1,000 mg by mouth 2 (two) times daily with a meal.        . triamcinolone (KENALOG) 0.1 % cream Apply 1 application topically as needed.       Marland Kitchen ZETIA 10 MG tablet Take 10 mg by mouth daily.       Marland Kitchen atorvastatin (LIPITOR) 80 MG tablet Take 40 mg by mouth daily.         Allergies  Allergen Reactions  . Niacin     REACTION: flushing  . Sulfonamide Derivatives     Review of Systems negative except from HPI and PMH  Physical Exam BP 126/78  Pulse 80  Ht 6' (1.829 m)  Wt 205 lb (92.987 kg)  BMI 27.80 kg/m2  SpO2 98% Well developed and well nourished in no acute distress HENT normal E scleral and icterus clear Neck Supple Clear to ausculation Regular rate and rhythm, no murmurs gallops or rub Soft with active bowel sounds No clubbing cyanosis and edema Alert and oriented, grossly normal motor and sensory function Skin Warm and Dry  Electrocardiogram demonstrates sinus  rhythm at 80 intervals 07/11/37 axis LXXV  Assessment and  Plan

## 2012-06-01 NOTE — Assessment & Plan Note (Signed)
The patient's device was interrogated.  The information was reviewed. No changes were made in the programming.    

## 2012-06-01 NOTE — Patient Instructions (Signed)
Your physician wants you to follow-up in: 1 year with Dr Klein.  You will receive a reminder letter in the mail two months in advance. If you don't receive a letter, please call our office to schedule the follow-up appointment.  

## 2012-06-01 NOTE — Assessment & Plan Note (Signed)
Stable with infrequent pacing

## 2012-07-16 DIAGNOSIS — M65839 Other synovitis and tenosynovitis, unspecified forearm: Secondary | ICD-10-CM | POA: Diagnosis not present

## 2012-09-20 DIAGNOSIS — E1149 Type 2 diabetes mellitus with other diabetic neurological complication: Secondary | ICD-10-CM | POA: Diagnosis not present

## 2012-09-20 DIAGNOSIS — E785 Hyperlipidemia, unspecified: Secondary | ICD-10-CM | POA: Diagnosis not present

## 2012-09-25 DIAGNOSIS — E1142 Type 2 diabetes mellitus with diabetic polyneuropathy: Secondary | ICD-10-CM | POA: Diagnosis not present

## 2012-09-25 DIAGNOSIS — E785 Hyperlipidemia, unspecified: Secondary | ICD-10-CM | POA: Diagnosis not present

## 2012-09-25 DIAGNOSIS — E1149 Type 2 diabetes mellitus with other diabetic neurological complication: Secondary | ICD-10-CM | POA: Diagnosis not present

## 2012-10-17 DIAGNOSIS — M65849 Other synovitis and tenosynovitis, unspecified hand: Secondary | ICD-10-CM | POA: Diagnosis not present

## 2012-10-17 DIAGNOSIS — M65839 Other synovitis and tenosynovitis, unspecified forearm: Secondary | ICD-10-CM | POA: Diagnosis not present

## 2012-11-26 DIAGNOSIS — H9319 Tinnitus, unspecified ear: Secondary | ICD-10-CM | POA: Diagnosis not present

## 2012-11-26 DIAGNOSIS — H905 Unspecified sensorineural hearing loss: Secondary | ICD-10-CM | POA: Diagnosis not present

## 2012-11-28 ENCOUNTER — Encounter: Payer: Self-pay | Admitting: Internal Medicine

## 2012-11-28 ENCOUNTER — Other Ambulatory Visit: Payer: Self-pay

## 2012-11-28 ENCOUNTER — Ambulatory Visit (INDEPENDENT_AMBULATORY_CARE_PROVIDER_SITE_OTHER): Payer: Medicare Other | Admitting: *Deleted

## 2012-11-28 DIAGNOSIS — I442 Atrioventricular block, complete: Secondary | ICD-10-CM

## 2012-11-28 LAB — PACEMAKER DEVICE OBSERVATION
AL AMPLITUDE: 1.4 mv
ATRIAL PACING PM: 0
BATTERY VOLTAGE: 2.79 V
RV LEAD IMPEDENCE PM: 419 Ohm
VENTRICULAR PACING PM: 0

## 2012-11-28 NOTE — Progress Notes (Signed)
PPM check 

## 2012-12-17 DIAGNOSIS — M25549 Pain in joints of unspecified hand: Secondary | ICD-10-CM | POA: Diagnosis not present

## 2012-12-17 DIAGNOSIS — M545 Low back pain: Secondary | ICD-10-CM | POA: Diagnosis not present

## 2012-12-17 DIAGNOSIS — M6530 Trigger finger, unspecified finger: Secondary | ICD-10-CM | POA: Diagnosis not present

## 2012-12-17 DIAGNOSIS — E119 Type 2 diabetes mellitus without complications: Secondary | ICD-10-CM | POA: Diagnosis not present

## 2012-12-25 DIAGNOSIS — E1149 Type 2 diabetes mellitus with other diabetic neurological complication: Secondary | ICD-10-CM | POA: Diagnosis not present

## 2012-12-25 DIAGNOSIS — E785 Hyperlipidemia, unspecified: Secondary | ICD-10-CM | POA: Diagnosis not present

## 2012-12-26 DIAGNOSIS — M67919 Unspecified disorder of synovium and tendon, unspecified shoulder: Secondary | ICD-10-CM | POA: Diagnosis not present

## 2012-12-26 DIAGNOSIS — M719 Bursopathy, unspecified: Secondary | ICD-10-CM | POA: Diagnosis not present

## 2012-12-27 DIAGNOSIS — E785 Hyperlipidemia, unspecified: Secondary | ICD-10-CM | POA: Diagnosis not present

## 2012-12-27 DIAGNOSIS — I10 Essential (primary) hypertension: Secondary | ICD-10-CM | POA: Diagnosis not present

## 2012-12-27 DIAGNOSIS — E1142 Type 2 diabetes mellitus with diabetic polyneuropathy: Secondary | ICD-10-CM | POA: Diagnosis not present

## 2012-12-27 DIAGNOSIS — E1149 Type 2 diabetes mellitus with other diabetic neurological complication: Secondary | ICD-10-CM | POA: Diagnosis not present

## 2013-01-16 DIAGNOSIS — M949 Disorder of cartilage, unspecified: Secondary | ICD-10-CM | POA: Diagnosis not present

## 2013-01-16 DIAGNOSIS — M546 Pain in thoracic spine: Secondary | ICD-10-CM | POA: Diagnosis not present

## 2013-01-16 DIAGNOSIS — M899 Disorder of bone, unspecified: Secondary | ICD-10-CM | POA: Diagnosis not present

## 2013-01-17 ENCOUNTER — Other Ambulatory Visit (HOSPITAL_COMMUNITY): Payer: Self-pay | Admitting: Sports Medicine

## 2013-01-17 DIAGNOSIS — M545 Low back pain: Secondary | ICD-10-CM | POA: Diagnosis not present

## 2013-01-17 DIAGNOSIS — M810 Age-related osteoporosis without current pathological fracture: Secondary | ICD-10-CM

## 2013-01-17 DIAGNOSIS — M25549 Pain in joints of unspecified hand: Secondary | ICD-10-CM | POA: Diagnosis not present

## 2013-01-17 DIAGNOSIS — E119 Type 2 diabetes mellitus without complications: Secondary | ICD-10-CM | POA: Diagnosis not present

## 2013-01-22 ENCOUNTER — Ambulatory Visit (HOSPITAL_COMMUNITY): Payer: Medicare Other

## 2013-06-03 ENCOUNTER — Encounter: Payer: Self-pay | Admitting: Internal Medicine

## 2013-06-03 ENCOUNTER — Ambulatory Visit (INDEPENDENT_AMBULATORY_CARE_PROVIDER_SITE_OTHER): Payer: Medicare Other | Admitting: Internal Medicine

## 2013-06-03 VITALS — BP 123/58 | HR 84 | Ht 71.0 in | Wt 212.4 lb

## 2013-06-03 DIAGNOSIS — Z950 Presence of cardiac pacemaker: Secondary | ICD-10-CM | POA: Diagnosis not present

## 2013-06-03 DIAGNOSIS — I442 Atrioventricular block, complete: Secondary | ICD-10-CM

## 2013-06-03 LAB — PACEMAKER DEVICE OBSERVATION
AL AMPLITUDE: 1 mv
AL THRESHOLD: 0.5 V
ATRIAL PACING PM: 0
BAMS-0001: 175 {beats}/min
RV LEAD IMPEDENCE PM: 433 Ohm
RV LEAD THRESHOLD: 0.75 V
VENTRICULAR PACING PM: 0

## 2013-06-03 NOTE — Progress Notes (Signed)
      Patient Care Team: Junious Silk, MD as PCP - General (Endocrinology)   HPI  Nathan [name] is a 72 y.o. male  seen in followup for pacemaker implantation for intermittent complete hear block undertaken in January 2010.   The patient denies chest pain, shortness of breath, nocturnal dyspnea, orthopnea or peripheral edema.  There have been no palpitations, lightheadedness or syncope.   Still caring for his 66 yo grandson      Past Medical History  Diagnosis Date  . DM (diabetes mellitus)   . HTN (hypertension)   . Hyperlipidemia   . Nephrolithiasis   . Vertigo   . Atrioventricular block, 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    Qualifier: Diagnosis of  By: Antoine Poche, MD, Gerrit Heck    . Pacemaker-Medtronic 05/10/2011    Medtronic device implanted January 2010     Past Surgical History  Procedure Laterality Date  . Pacemaker placement    . Appendectomy      Current Outpatient Prescriptions  Medication Sig Dispense Refill  . aspirin 81 MG tablet Take 81 mg by mouth daily.        Marland Kitchen atorvastatin (LIPITOR) 80 MG tablet Take 40 mg by mouth daily.       Marland Kitchen DIOVAN 160 MG tablet Take 80 mg by mouth daily.       Marland Kitchen glimepiride (AMARYL) 1 MG tablet Take 1 mg by mouth 2 (two) times daily.       Marland Kitchen HYDROcodone-acetaminophen (NORCO) 5-325 MG per tablet Take 1 tablet by mouth every 8 (eight) hours as needed.       Marland Kitchen ibuprofen (ADVIL,MOTRIN) 400 MG tablet       . metFORMIN (GLUCOPHAGE) 1000 MG tablet Take 1,000 mg by mouth 2 (two) times daily with a meal.        . triamcinolone (KENALOG) 0.1 % cream Apply 1 application topically as needed.       Marland Kitchen ZETIA 10 MG tablet Take 10 mg by mouth daily.        No current facility-administered medications for this visit.    Allergies  Allergen Reactions  . Niacin     REACTION: flushing  . Sulfonamide Derivatives     Review of Systems negative except from HPI and PMH  Physical Exam BP 123/58  Pulse 84  Ht 5\' 11"  (1.803 m)  Wt  212 lb 6.4 oz (96.344 kg)  BMI 29.64 kg/m2 Well developed and nourished in no acute distress HENT normal Neck supple with JVP-flat Clear Device pocket well healed; without hematoma or erythema.  There is no tethering  Regular rate and rhythm, no murmurs or gallops Abd-soft with active BS No Clubbing cyanosis edema Skin-warm and dry A & Oriented  Grossly normal sensory and motor function  ECG SR  .11/.12.38  Assessment and  Plan

## 2013-06-03 NOTE — Assessment & Plan Note (Signed)
The patient's device was interrogated.  The information was reviewed. No changes were made in the programming.    

## 2013-06-03 NOTE — Assessment & Plan Note (Signed)
0.1% pacing

## 2013-06-03 NOTE — Patient Instructions (Signed)
Your physician recommends that you continue on your current medications as directed. Please refer to the Current Medication list given to you today.  Your physician wants you to follow-up in: 6 months with device clinic. You will receive a reminder letter in the mail two months in advance. If you don't receive a letter, please call our office to schedule the follow-up appointment.  Your physician wants you to follow-up in: one year with Rick Duff, PAC.  You will receive a reminder letter in the mail two months in advance. If you don't receive a letter, please call our office to schedule the follow-up appointment.

## 2013-06-04 ENCOUNTER — Encounter: Payer: Self-pay | Admitting: Internal Medicine

## 2013-07-09 DIAGNOSIS — E119 Type 2 diabetes mellitus without complications: Secondary | ICD-10-CM | POA: Diagnosis not present

## 2013-07-09 DIAGNOSIS — H905 Unspecified sensorineural hearing loss: Secondary | ICD-10-CM | POA: Diagnosis not present

## 2013-07-09 DIAGNOSIS — H833X9 Noise effects on inner ear, unspecified ear: Secondary | ICD-10-CM | POA: Diagnosis not present

## 2013-07-09 DIAGNOSIS — H9319 Tinnitus, unspecified ear: Secondary | ICD-10-CM | POA: Diagnosis not present

## 2013-07-31 DIAGNOSIS — M25549 Pain in joints of unspecified hand: Secondary | ICD-10-CM | POA: Diagnosis not present

## 2013-10-22 DIAGNOSIS — S8010XA Contusion of unspecified lower leg, initial encounter: Secondary | ICD-10-CM | POA: Diagnosis not present

## 2013-12-11 ENCOUNTER — Encounter: Payer: Self-pay | Admitting: *Deleted

## 2014-01-01 DIAGNOSIS — R1310 Dysphagia, unspecified: Secondary | ICD-10-CM | POA: Diagnosis not present

## 2014-01-01 DIAGNOSIS — J029 Acute pharyngitis, unspecified: Secondary | ICD-10-CM | POA: Diagnosis not present

## 2014-01-21 DIAGNOSIS — I10 Essential (primary) hypertension: Secondary | ICD-10-CM | POA: Diagnosis not present

## 2014-01-21 DIAGNOSIS — E1149 Type 2 diabetes mellitus with other diabetic neurological complication: Secondary | ICD-10-CM | POA: Diagnosis not present

## 2014-01-21 DIAGNOSIS — E1142 Type 2 diabetes mellitus with diabetic polyneuropathy: Secondary | ICD-10-CM | POA: Diagnosis not present

## 2014-01-21 DIAGNOSIS — E782 Mixed hyperlipidemia: Secondary | ICD-10-CM | POA: Diagnosis not present

## 2014-02-18 DIAGNOSIS — D216 Benign neoplasm of connective and other soft tissue of trunk, unspecified: Secondary | ICD-10-CM | POA: Diagnosis not present

## 2014-02-19 ENCOUNTER — Encounter: Payer: Self-pay | Admitting: *Deleted

## 2014-03-27 DIAGNOSIS — H2510 Age-related nuclear cataract, unspecified eye: Secondary | ICD-10-CM | POA: Diagnosis not present

## 2014-03-27 DIAGNOSIS — H35379 Puckering of macula, unspecified eye: Secondary | ICD-10-CM | POA: Diagnosis not present

## 2014-03-27 DIAGNOSIS — H01029 Squamous blepharitis unspecified eye, unspecified eyelid: Secondary | ICD-10-CM | POA: Diagnosis not present

## 2014-03-27 DIAGNOSIS — E119 Type 2 diabetes mellitus without complications: Secondary | ICD-10-CM | POA: Diagnosis not present

## 2014-03-27 DIAGNOSIS — H353190 Nonexudative age-related macular degeneration, unspecified eye, stage unspecified: Secondary | ICD-10-CM | POA: Diagnosis not present

## 2014-04-22 DIAGNOSIS — E1149 Type 2 diabetes mellitus with other diabetic neurological complication: Secondary | ICD-10-CM | POA: Diagnosis not present

## 2014-04-22 DIAGNOSIS — Z125 Encounter for screening for malignant neoplasm of prostate: Secondary | ICD-10-CM | POA: Diagnosis not present

## 2014-04-22 DIAGNOSIS — E782 Mixed hyperlipidemia: Secondary | ICD-10-CM | POA: Diagnosis not present

## 2014-04-25 DIAGNOSIS — E1149 Type 2 diabetes mellitus with other diabetic neurological complication: Secondary | ICD-10-CM | POA: Diagnosis not present

## 2014-04-25 DIAGNOSIS — E1142 Type 2 diabetes mellitus with diabetic polyneuropathy: Secondary | ICD-10-CM | POA: Diagnosis not present

## 2014-04-25 DIAGNOSIS — E782 Mixed hyperlipidemia: Secondary | ICD-10-CM | POA: Diagnosis not present

## 2014-04-25 DIAGNOSIS — I10 Essential (primary) hypertension: Secondary | ICD-10-CM | POA: Diagnosis not present

## 2014-05-07 DIAGNOSIS — Z23 Encounter for immunization: Secondary | ICD-10-CM | POA: Diagnosis not present

## 2014-06-03 ENCOUNTER — Encounter: Payer: Medicare Other | Admitting: Internal Medicine

## 2014-06-18 ENCOUNTER — Encounter: Payer: Self-pay | Admitting: Internal Medicine

## 2014-06-18 ENCOUNTER — Ambulatory Visit (INDEPENDENT_AMBULATORY_CARE_PROVIDER_SITE_OTHER): Payer: Medicare Other | Admitting: Internal Medicine

## 2014-06-18 VITALS — BP 120/68 | HR 65 | Ht 72.0 in | Wt 201.4 lb

## 2014-06-18 DIAGNOSIS — I442 Atrioventricular block, complete: Secondary | ICD-10-CM

## 2014-06-18 DIAGNOSIS — Z45018 Encounter for adjustment and management of other part of cardiac pacemaker: Secondary | ICD-10-CM | POA: Diagnosis not present

## 2014-06-18 LAB — MDC_IDC_ENUM_SESS_TYPE_INCLINIC
Battery Impedance: 271 Ohm
Battery Remaining Longevity: 110 mo
Battery Voltage: 2.79 V
Date Time Interrogation Session: 20151118093659
Lead Channel Impedance Value: 432 Ohm
Lead Channel Impedance Value: 591 Ohm
Lead Channel Pacing Threshold Amplitude: 0.5 V
Lead Channel Pacing Threshold Pulse Width: 0.4 ms
Lead Channel Setting Pacing Amplitude: 2.5 V
Lead Channel Setting Pacing Pulse Width: 0.4 ms
Lead Channel Setting Sensing Sensitivity: 5.6 mV
MDC IDC MSMT LEADCHNL RA PACING THRESHOLD AMPLITUDE: 0.5 V
MDC IDC MSMT LEADCHNL RA PACING THRESHOLD PULSEWIDTH: 0.4 ms
MDC IDC MSMT LEADCHNL RA SENSING INTR AMPL: 1.4 mV
MDC IDC MSMT LEADCHNL RV SENSING INTR AMPL: 15.67 mV
MDC IDC SET LEADCHNL RA PACING AMPLITUDE: 2 V
MDC IDC STAT BRADY AP VP PERCENT: 0 %
MDC IDC STAT BRADY AP VS PERCENT: 0 %
MDC IDC STAT BRADY AS VP PERCENT: 49 %
MDC IDC STAT BRADY AS VS PERCENT: 50 %

## 2014-06-18 NOTE — Patient Instructions (Signed)
Your physician recommends that you continue on your current medications as directed. Please refer to the Current Medication list given to you today.  Your physician wants you to follow-up in: 1 year with Dr. Klein.  You will receive a reminder letter in the mail two months in advance. If you don't receive a letter, please call our office to schedule the follow-up appointment.  

## 2014-06-18 NOTE — Progress Notes (Signed)
      Patient Care Team: Limmie Patricia, MD as PCP - General (Endocrinology)   HPI  Nathan [name] is a 73 y.o. male  seen in followup for pacemaker implantation for intermittent complete hear block undertaken in January 2010.   The patient denies chest pain, shortness of breath, nocturnal dyspnea, orthopnea or peripheral edema.  There have been no palpitations, lightheadedness or syncope.   Still caring for his 45 yo grandson who has been diagnosed with oppositional defiant disorder      Past Medical History  Diagnosis Date  . DM (diabetes mellitus)   . HTN (hypertension)   . Hyperlipidemia   . Nephrolithiasis   . Vertigo   . Atrioventricular block, 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    Qualifier: Diagnosis of  By: [name], MD, [name]    . Pacemaker-Medtronic 05/10/2011    Medtronic device implanted January 2010     Past Surgical History  Procedure Laterality Date  . Pacemaker placement    . Appendectomy      Current Outpatient Prescriptions  Medication Sig Dispense Refill  . aspirin 81 MG tablet Take 81 mg by mouth daily.      Marland Kitchen atorvastatin (LIPITOR) 40 MG tablet Take 40 mg by mouth 2 (two) times daily.  4  . carvedilol (COREG) 25 MG tablet Take 25 mg by mouth 2 (two) times daily with a meal.    . glimepiride (AMARYL) 1 MG tablet Take 1 mg by mouth 2 (two) times daily.     Marland Kitchen HYDROcodone-acetaminophen (NORCO) 5-325 MG per tablet Take 1 tablet by mouth every 8 (eight) hours as needed.     Marland Kitchen ibuprofen (ADVIL,MOTRIN) 400 MG tablet     . metFORMIN (GLUCOPHAGE) 1000 MG tablet Take 1,000 mg by mouth 2 (two) times daily with a meal.      . ZETIA 10 MG tablet Take 10 mg by mouth daily.      No current facility-administered medications for this visit.    Allergies  Allergen Reactions  . Niacin     REACTION: flushing  . Sulfonamide Derivatives     Review of Systems negative except from HPI and PMH  Physical Exam BP 120/68 mmHg  Pulse 65  Ht 6' (1.829  m)  Wt 91.354 kg (201 lb 6.4 oz)  BMI 27.31 kg/m2 Well developed and nourished in no acute distress HENT normal Neck supple with JVP-flat Clear Device pocket well healed; without hematoma or erythema.  There is no tethering  Regular rate and rhythm, no murmurs or gallops Abd-soft with active BS No Clubbing cyanosis edema Skin-warm and dry A & Oriented  Grossly normal sensory and motor function  ECG SR  .11/.12.38  Assessment and  Plan  Intermittent complete heart block  Pacemaker-Medtronic The patient's device was interrogated.  The information was reviewed. No changes were made in the programming.

## 2014-06-20 DIAGNOSIS — H35373 Puckering of macula, bilateral: Secondary | ICD-10-CM | POA: Diagnosis not present

## 2014-06-24 ENCOUNTER — Encounter: Payer: Self-pay | Admitting: Internal Medicine

## 2014-08-04 DIAGNOSIS — E1140 Type 2 diabetes mellitus with diabetic neuropathy, unspecified: Secondary | ICD-10-CM | POA: Diagnosis not present

## 2014-08-04 DIAGNOSIS — E782 Mixed hyperlipidemia: Secondary | ICD-10-CM | POA: Diagnosis not present

## 2014-08-11 DIAGNOSIS — I10 Essential (primary) hypertension: Secondary | ICD-10-CM | POA: Diagnosis not present

## 2014-08-11 DIAGNOSIS — E1142 Type 2 diabetes mellitus with diabetic polyneuropathy: Secondary | ICD-10-CM | POA: Diagnosis not present

## 2014-08-11 DIAGNOSIS — Z125 Encounter for screening for malignant neoplasm of prostate: Secondary | ICD-10-CM | POA: Diagnosis not present

## 2014-08-11 DIAGNOSIS — E782 Mixed hyperlipidemia: Secondary | ICD-10-CM | POA: Diagnosis not present

## 2014-09-18 ENCOUNTER — Telehealth: Payer: Self-pay | Admitting: Cardiology

## 2014-09-18 ENCOUNTER — Encounter: Payer: Medicare Other | Admitting: *Deleted

## 2014-09-18 NOTE — Telephone Encounter (Signed)
LMOVM reminding pt to send remote transmission.   

## 2014-09-19 ENCOUNTER — Encounter: Payer: Self-pay | Admitting: Cardiology

## 2014-10-08 DIAGNOSIS — J018 Other acute sinusitis: Secondary | ICD-10-CM | POA: Diagnosis not present

## 2014-11-10 DIAGNOSIS — E1142 Type 2 diabetes mellitus with diabetic polyneuropathy: Secondary | ICD-10-CM | POA: Diagnosis not present

## 2014-11-10 DIAGNOSIS — E782 Mixed hyperlipidemia: Secondary | ICD-10-CM | POA: Diagnosis not present

## 2014-11-14 DIAGNOSIS — E782 Mixed hyperlipidemia: Secondary | ICD-10-CM | POA: Diagnosis not present

## 2014-11-14 DIAGNOSIS — E1142 Type 2 diabetes mellitus with diabetic polyneuropathy: Secondary | ICD-10-CM | POA: Diagnosis not present

## 2014-11-14 DIAGNOSIS — Z125 Encounter for screening for malignant neoplasm of prostate: Secondary | ICD-10-CM | POA: Diagnosis not present

## 2014-11-14 DIAGNOSIS — I10 Essential (primary) hypertension: Secondary | ICD-10-CM | POA: Diagnosis not present

## 2015-03-09 DIAGNOSIS — S83242A Other tear of medial meniscus, current injury, left knee, initial encounter: Secondary | ICD-10-CM | POA: Diagnosis not present

## 2015-03-19 DIAGNOSIS — Z79899 Other long term (current) drug therapy: Secondary | ICD-10-CM | POA: Diagnosis not present

## 2015-03-19 DIAGNOSIS — E1142 Type 2 diabetes mellitus with diabetic polyneuropathy: Secondary | ICD-10-CM | POA: Diagnosis not present

## 2015-03-19 DIAGNOSIS — E782 Mixed hyperlipidemia: Secondary | ICD-10-CM | POA: Diagnosis not present

## 2015-03-24 DIAGNOSIS — I10 Essential (primary) hypertension: Secondary | ICD-10-CM | POA: Diagnosis not present

## 2015-03-24 DIAGNOSIS — E1142 Type 2 diabetes mellitus with diabetic polyneuropathy: Secondary | ICD-10-CM | POA: Diagnosis not present

## 2015-03-24 DIAGNOSIS — E782 Mixed hyperlipidemia: Secondary | ICD-10-CM | POA: Diagnosis not present

## 2015-03-24 DIAGNOSIS — Z79899 Other long term (current) drug therapy: Secondary | ICD-10-CM | POA: Diagnosis not present

## 2015-03-24 DIAGNOSIS — Z125 Encounter for screening for malignant neoplasm of prostate: Secondary | ICD-10-CM | POA: Diagnosis not present

## 2015-04-14 DIAGNOSIS — H35373 Puckering of macula, bilateral: Secondary | ICD-10-CM | POA: Diagnosis not present

## 2015-04-14 DIAGNOSIS — H3531 Nonexudative age-related macular degeneration: Secondary | ICD-10-CM | POA: Diagnosis not present

## 2015-05-19 DIAGNOSIS — R1314 Dysphagia, pharyngoesophageal phase: Secondary | ICD-10-CM | POA: Diagnosis not present

## 2015-06-23 ENCOUNTER — Encounter: Payer: Self-pay | Admitting: Internal Medicine

## 2015-06-23 ENCOUNTER — Ambulatory Visit (INDEPENDENT_AMBULATORY_CARE_PROVIDER_SITE_OTHER): Payer: Medicare Other | Admitting: Internal Medicine

## 2015-06-23 VITALS — BP 136/70 | HR 71 | Ht 72.0 in | Wt 204.0 lb

## 2015-06-23 DIAGNOSIS — I442 Atrioventricular block, complete: Secondary | ICD-10-CM

## 2015-06-23 DIAGNOSIS — Z950 Presence of cardiac pacemaker: Secondary | ICD-10-CM

## 2015-06-23 NOTE — Progress Notes (Signed)
      Patient Care Team: Lorne Skeens, MD as PCP - General (Endocrinology)   HPI  Nathan [name] is a 74 y.o. male seen in followup for pacemaker implantation for intermittent complete hear block undertaken in January 2010.   The patient denies chest pain, shortness of breath, nocturnal dyspnea, orthopnea or peripheral edema.  There have been no palpitations, lightheadedness or syncope.        Past Medical History  Diagnosis Date  . DM (diabetes mellitus)   . HTN (hypertension)   . Hyperlipidemia   . Nephrolithiasis   . Vertigo   . Atrioventricular block, 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    Qualifier: Diagnosis of  By: [name], MD, [name]    . Pacemaker-Medtronic 05/10/2011    Medtronic device implanted January 2010     Past Surgical History  Procedure Laterality Date  . Pacemaker placement    . Appendectomy      Current Outpatient Prescriptions  Medication Sig Dispense Refill  . aspirin 81 MG tablet Take 81 mg by mouth daily.      Marland Kitchen atorvastatin (LIPITOR) 40 MG tablet Take 40 mg by mouth 2 (two) times daily.  4  . carvedilol (COREG) 25 MG tablet Take 25 mg by mouth 2 (two) times daily with a meal.    . glimepiride (AMARYL) 1 MG tablet Take 1 mg by mouth 2 (two) times daily.     . metFORMIN (GLUCOPHAGE) 1000 MG tablet Take 1,000 mg by mouth 2 (two) times daily with a meal.      . ZETIA 10 MG tablet Take 10 mg by mouth daily.      No current facility-administered medications for this visit.    Allergies  Allergen Reactions  . Niacin     REACTION: flushing  . Sulfonamide Derivatives     Review of Systems negative except from HPI and PMH  Physical Exam BP 136/70 mmHg  Pulse 71  Ht 6' (1.829 m)  Wt 204 lb (92.534 kg)  BMI 27.66 kg/m2 Well developed and nourished in no acute distress HENT normal Neck supple with JVP-flat Clear Device pocket well healed; without hematoma or erythema.  There is no tethering  Regular rate and rhythm, no murmurs  or gallops Abd-soft with active BS No Clubbing cyanosis edema Skin-warm and dry A & Oriented  Grossly normal sensory and motor function  ECG SR  .11/.12.38  Assessment and  Plan  Complete heart block now persisting  Pacemaker-Medtronic The patient's device was interrogated.  The information was reviewed. No changes were made in the programming.

## 2015-06-23 NOTE — Patient Instructions (Signed)
Medication Instructions: - no changes  Labwork: - none  Procedures/Testing: - none  Follow-Up: - Remote monitoring is used to monitor your Pacemaker of ICD from home. This monitoring reduces the number of office visits required to check your device to one time per year. It allows [place] to keep an eye on the functioning of your device to ensure it is working properly. You are scheduled for a device check from home on 09/22/15. You may send your transmission at any time that day. If you have a wireless device, the transmission will be sent automatically. After your physician reviews your transmission, you will receive a postcard with your next transmission date.  - Your physician wants you to follow-up in: 1 year with Dr. Caryl Comes. You will receive a reminder letter in the mail two months in advance. If you don't receive a letter, please call our office to schedule the follow-up appointment.  Any Additional Special Instructions Will Be Listed Below (If Applicable).

## 2015-07-02 LAB — CUP PACEART INCLINIC DEVICE CHECK
Battery Remaining Longevity: 102 mo
Battery Voltage: 2.79 V
Brady Statistic AS VP Percent: 37 %
Brady Statistic AS VS Percent: 42 %
Implantable Lead Implant Date: 20100125
Implantable Lead Location: 753859
Implantable Lead Location: 753860
Implantable Lead Model: 5076
Lead Channel Impedance Value: 401 Ohm
Lead Channel Impedance Value: 556 Ohm
Lead Channel Pacing Threshold Amplitude: 0.75 V
Lead Channel Pacing Threshold Pulse Width: 0.4 ms
Lead Channel Setting Pacing Amplitude: 2 V
Lead Channel Setting Pacing Pulse Width: 0.4 ms
Lead Channel Setting Sensing Sensitivity: 5.6 mV
MDC IDC LEAD IMPLANT DT: 20100125
MDC IDC MSMT BATTERY IMPEDANCE: 318 Ohm
MDC IDC MSMT LEADCHNL RA PACING THRESHOLD AMPLITUDE: 0.5 V
MDC IDC MSMT LEADCHNL RA PACING THRESHOLD PULSEWIDTH: 0.4 ms
MDC IDC MSMT LEADCHNL RA SENSING INTR AMPL: 0.7 mV
MDC IDC SESS DTM: 20161122213317
MDC IDC SET LEADCHNL RV PACING AMPLITUDE: 2.5 V
MDC IDC STAT BRADY AP VP PERCENT: 11 %
MDC IDC STAT BRADY AP VS PERCENT: 10 %

## 2015-08-11 DIAGNOSIS — M47814 Spondylosis without myelopathy or radiculopathy, thoracic region: Secondary | ICD-10-CM | POA: Diagnosis not present

## 2015-08-25 DIAGNOSIS — E782 Mixed hyperlipidemia: Secondary | ICD-10-CM | POA: Diagnosis not present

## 2015-08-25 DIAGNOSIS — E1142 Type 2 diabetes mellitus with diabetic polyneuropathy: Secondary | ICD-10-CM | POA: Diagnosis not present

## 2015-08-25 DIAGNOSIS — Z125 Encounter for screening for malignant neoplasm of prostate: Secondary | ICD-10-CM | POA: Diagnosis not present

## 2015-08-28 DIAGNOSIS — Z125 Encounter for screening for malignant neoplasm of prostate: Secondary | ICD-10-CM | POA: Diagnosis not present

## 2015-08-28 DIAGNOSIS — E782 Mixed hyperlipidemia: Secondary | ICD-10-CM | POA: Diagnosis not present

## 2015-08-28 DIAGNOSIS — Z79899 Other long term (current) drug therapy: Secondary | ICD-10-CM | POA: Diagnosis not present

## 2015-08-28 DIAGNOSIS — E1142 Type 2 diabetes mellitus with diabetic polyneuropathy: Secondary | ICD-10-CM | POA: Diagnosis not present

## 2015-08-28 DIAGNOSIS — I10 Essential (primary) hypertension: Secondary | ICD-10-CM | POA: Diagnosis not present

## 2015-09-21 ENCOUNTER — Telehealth: Payer: Self-pay | Admitting: Internal Medicine

## 2015-09-21 NOTE — Telephone Encounter (Signed)
New Message  Pt cancelled remote check- stated he does not have home monitor- Please call back and discuss.

## 2015-09-21 NOTE — Telephone Encounter (Signed)
Spoke w/ pt and he said that he never received his home monitor. When I offered to order pt a new monitor but pt refused and said that he would rather come into the office. States that he has felt different since his 06-23-15 in office check. He did not elaborate on how he felt. Pt agreed to come into the office on 09-30-15 at 2:30 PM.

## 2015-09-30 ENCOUNTER — Ambulatory Visit (INDEPENDENT_AMBULATORY_CARE_PROVIDER_SITE_OTHER): Payer: Medicare Other | Admitting: *Deleted

## 2015-09-30 ENCOUNTER — Encounter: Payer: Self-pay | Admitting: Internal Medicine

## 2015-09-30 DIAGNOSIS — Z950 Presence of cardiac pacemaker: Secondary | ICD-10-CM | POA: Diagnosis not present

## 2015-09-30 DIAGNOSIS — I442 Atrioventricular block, complete: Secondary | ICD-10-CM | POA: Diagnosis not present

## 2015-09-30 DIAGNOSIS — M7582 Other shoulder lesions, left shoulder: Secondary | ICD-10-CM | POA: Diagnosis not present

## 2015-09-30 LAB — CUP PACEART INCLINIC DEVICE CHECK
Battery Impedance: 365 Ohm
Battery Voltage: 2.78 V
Brady Statistic AP VS Percent: 0 %
Brady Statistic AS VP Percent: 79 %
Date Time Interrogation Session: 20170301174107
Implantable Lead Implant Date: 20100125
Implantable Lead Location: 753860
Implantable Lead Model: 5076
Implantable Lead Model: 5076
Lead Channel Impedance Value: 407 Ohm
Lead Channel Impedance Value: 540 Ohm
Lead Channel Pacing Threshold Amplitude: 0.5 V
Lead Channel Pacing Threshold Pulse Width: 0.4 ms
Lead Channel Pacing Threshold Pulse Width: 0.4 ms
Lead Channel Sensing Intrinsic Amplitude: 1.4 mV
Lead Channel Setting Pacing Amplitude: 2 V
Lead Channel Setting Pacing Amplitude: 2.5 V
Lead Channel Setting Sensing Sensitivity: 5.6 mV
MDC IDC LEAD IMPLANT DT: 20100125
MDC IDC LEAD LOCATION: 753859
MDC IDC MSMT BATTERY REMAINING LONGEVITY: 88 mo
MDC IDC MSMT LEADCHNL RV PACING THRESHOLD AMPLITUDE: 0.5 V
MDC IDC SET LEADCHNL RV PACING PULSEWIDTH: 0.4 ms
MDC IDC STAT BRADY AP VP PERCENT: 21 %
MDC IDC STAT BRADY AS VS PERCENT: 0 %

## 2015-09-30 NOTE — Progress Notes (Signed)
Pacemaker check in clinic. Patient reports feeling "different" in recent months, denies SOB, LE edema, or unexplained weight gain. Normal device function. Thresholds, sensing, impedances consistent with previous measurements. Device programmed to maximize longevity. 1 mode switch (<0.1%), no EGM as <30 sec duration. No high ventricular rates noted. Device programmed at appropriate safety margins. Histogram distribution appropriate for patient activity level. Note that patient is now Vp 99.9%, reviewed with AS, reprogrammed mode from MVP to DDDR. AS also recommended obtaining an echo and patient is agreeable. Estimated longevity 7.5 years. Patient education completed. Patient declines remote follow-up. Plan for echo as scheduled and ROV with SK in 06/2016.

## 2015-09-30 NOTE — Patient Instructions (Signed)
Our office will call you to schedule an echocardiogram.

## 2015-10-02 ENCOUNTER — Other Ambulatory Visit: Payer: Self-pay | Admitting: Sports Medicine

## 2015-10-02 DIAGNOSIS — M5412 Radiculopathy, cervical region: Secondary | ICD-10-CM | POA: Diagnosis not present

## 2015-10-02 DIAGNOSIS — M5410 Radiculopathy, site unspecified: Secondary | ICD-10-CM

## 2015-10-02 DIAGNOSIS — R52 Pain, unspecified: Secondary | ICD-10-CM

## 2015-10-05 ENCOUNTER — Other Ambulatory Visit: Payer: Self-pay

## 2015-10-05 ENCOUNTER — Ambulatory Visit (HOSPITAL_COMMUNITY): Payer: Medicare Other | Attending: Cardiovascular Disease

## 2015-10-05 DIAGNOSIS — Z950 Presence of cardiac pacemaker: Secondary | ICD-10-CM | POA: Insufficient documentation

## 2015-10-05 DIAGNOSIS — I351 Nonrheumatic aortic (valve) insufficiency: Secondary | ICD-10-CM | POA: Diagnosis not present

## 2015-10-05 DIAGNOSIS — E785 Hyperlipidemia, unspecified: Secondary | ICD-10-CM | POA: Diagnosis not present

## 2015-10-05 DIAGNOSIS — I119 Hypertensive heart disease without heart failure: Secondary | ICD-10-CM | POA: Diagnosis not present

## 2015-10-05 DIAGNOSIS — E119 Type 2 diabetes mellitus without complications: Secondary | ICD-10-CM | POA: Diagnosis not present

## 2015-10-05 DIAGNOSIS — I071 Rheumatic tricuspid insufficiency: Secondary | ICD-10-CM | POA: Insufficient documentation

## 2015-10-05 DIAGNOSIS — I371 Nonrheumatic pulmonary valve insufficiency: Secondary | ICD-10-CM | POA: Diagnosis not present

## 2015-10-05 DIAGNOSIS — I442 Atrioventricular block, complete: Secondary | ICD-10-CM | POA: Insufficient documentation

## 2015-10-05 DIAGNOSIS — I340 Nonrheumatic mitral (valve) insufficiency: Secondary | ICD-10-CM | POA: Diagnosis not present

## 2015-10-06 ENCOUNTER — Telehealth: Payer: Self-pay | Admitting: Internal Medicine

## 2015-10-06 NOTE — Telephone Encounter (Signed)
New message ° ° ° ° ° °Calling to get echo results °

## 2015-10-07 NOTE — Telephone Encounter (Signed)
Spoke with pt, echo results given to pt

## 2015-10-08 ENCOUNTER — Telehealth: Payer: Self-pay | Admitting: *Deleted

## 2015-10-08 ENCOUNTER — Ambulatory Visit
Admission: RE | Admit: 2015-10-08 | Discharge: 2015-10-08 | Disposition: A | Discharge status: Home / Self Care | Payer: Medicare Other | Source: Ambulatory Visit | Attending: Sports Medicine | Admitting: Sports Medicine

## 2015-10-08 DIAGNOSIS — M5410 Radiculopathy, site unspecified: Secondary | ICD-10-CM

## 2015-10-08 DIAGNOSIS — R52 Pain, unspecified: Secondary | ICD-10-CM

## 2015-10-08 DIAGNOSIS — M4722 Other spondylosis with radiculopathy, cervical region: Secondary | ICD-10-CM | POA: Diagnosis not present

## 2015-10-08 NOTE — Telephone Encounter (Signed)
-----   Message from Patsey Berthold, NP sent at 10/05/2015  7:10 PM EST ----- Please notify patient of normal heart pumping function.

## 2015-10-12 ENCOUNTER — Telehealth: Payer: Self-pay | Admitting: *Deleted

## 2015-10-12 NOTE — Telephone Encounter (Signed)
-----   Message from Nathan Berthold, NP sent at 10/05/2015  7:10 PM EST ----- Please notify patient of normal heart pumping function.

## 2015-11-20 DIAGNOSIS — E782 Mixed hyperlipidemia: Secondary | ICD-10-CM | POA: Diagnosis not present

## 2015-11-20 DIAGNOSIS — E1142 Type 2 diabetes mellitus with diabetic polyneuropathy: Secondary | ICD-10-CM | POA: Diagnosis not present

## 2015-11-24 DIAGNOSIS — I10 Essential (primary) hypertension: Secondary | ICD-10-CM | POA: Diagnosis not present

## 2015-11-24 DIAGNOSIS — Z79899 Other long term (current) drug therapy: Secondary | ICD-10-CM | POA: Diagnosis not present

## 2015-11-24 DIAGNOSIS — Z125 Encounter for screening for malignant neoplasm of prostate: Secondary | ICD-10-CM | POA: Diagnosis not present

## 2015-11-24 DIAGNOSIS — E782 Mixed hyperlipidemia: Secondary | ICD-10-CM | POA: Diagnosis not present

## 2015-11-24 DIAGNOSIS — E1142 Type 2 diabetes mellitus with diabetic polyneuropathy: Secondary | ICD-10-CM | POA: Diagnosis not present

## 2016-02-03 DIAGNOSIS — M25511 Pain in right shoulder: Secondary | ICD-10-CM | POA: Diagnosis not present

## 2016-02-16 DIAGNOSIS — M542 Cervicalgia: Secondary | ICD-10-CM | POA: Diagnosis not present

## 2016-02-19 DIAGNOSIS — E1142 Type 2 diabetes mellitus with diabetic polyneuropathy: Secondary | ICD-10-CM | POA: Diagnosis not present

## 2016-02-19 DIAGNOSIS — E782 Mixed hyperlipidemia: Secondary | ICD-10-CM | POA: Diagnosis not present

## 2016-02-23 DIAGNOSIS — I10 Essential (primary) hypertension: Secondary | ICD-10-CM | POA: Diagnosis not present

## 2016-02-23 DIAGNOSIS — Z79899 Other long term (current) drug therapy: Secondary | ICD-10-CM | POA: Diagnosis not present

## 2016-02-23 DIAGNOSIS — E1142 Type 2 diabetes mellitus with diabetic polyneuropathy: Secondary | ICD-10-CM | POA: Diagnosis not present

## 2016-02-23 DIAGNOSIS — E782 Mixed hyperlipidemia: Secondary | ICD-10-CM | POA: Diagnosis not present

## 2016-02-23 DIAGNOSIS — Z125 Encounter for screening for malignant neoplasm of prostate: Secondary | ICD-10-CM | POA: Diagnosis not present

## 2016-03-28 ENCOUNTER — Ambulatory Visit (INDEPENDENT_AMBULATORY_CARE_PROVIDER_SITE_OTHER): Payer: Medicare Other | Admitting: *Deleted

## 2016-03-28 DIAGNOSIS — I442 Atrioventricular block, complete: Secondary | ICD-10-CM

## 2016-03-28 DIAGNOSIS — Z950 Presence of cardiac pacemaker: Secondary | ICD-10-CM | POA: Diagnosis not present

## 2016-03-28 LAB — CUP PACEART INCLINIC DEVICE CHECK
Battery Impedance: 485 Ohm
Brady Statistic AP VP Percent: 14 %
Brady Statistic AP VS Percent: 0 %
Brady Statistic AS VP Percent: 86 %
Brady Statistic AS VS Percent: 0 %
Implantable Lead Implant Date: 20100125
Implantable Lead Location: 753859
Lead Channel Pacing Threshold Amplitude: 0.375 V
Lead Channel Pacing Threshold Amplitude: 0.75 V
Lead Channel Pacing Threshold Pulse Width: 0.4 ms
Lead Channel Pacing Threshold Pulse Width: 0.4 ms
Lead Channel Setting Pacing Amplitude: 2 V
Lead Channel Setting Pacing Pulse Width: 0.4 ms
MDC IDC LEAD IMPLANT DT: 20100125
MDC IDC LEAD LOCATION: 753860
MDC IDC MSMT BATTERY REMAINING LONGEVITY: 79 mo
MDC IDC MSMT BATTERY VOLTAGE: 2.78 V
MDC IDC MSMT LEADCHNL RA IMPEDANCE VALUE: 556 Ohm
MDC IDC MSMT LEADCHNL RA PACING THRESHOLD AMPLITUDE: 0.5 V
MDC IDC MSMT LEADCHNL RA PACING THRESHOLD PULSEWIDTH: 0.4 ms
MDC IDC MSMT LEADCHNL RA SENSING INTR AMPL: 0.7 mV
MDC IDC MSMT LEADCHNL RV IMPEDANCE VALUE: 403 Ohm
MDC IDC MSMT LEADCHNL RV PACING THRESHOLD AMPLITUDE: 0.625 V
MDC IDC MSMT LEADCHNL RV PACING THRESHOLD PULSEWIDTH: 0.4 ms
MDC IDC SESS DTM: 20170828093954
MDC IDC SET LEADCHNL RV PACING AMPLITUDE: 2.5 V
MDC IDC SET LEADCHNL RV SENSING SENSITIVITY: 5.6 mV

## 2016-03-28 NOTE — Progress Notes (Signed)
Pacemaker check in clinic. Normal device function. Thresholds, sensing, impedances consistent with previous measurements. Device programmed to maximize longevity. 2 mode switch- both < 1 min, no EGMs. No high ventricular rates noted. Device programmed at appropriate safety margins. Histogram distribution appropriate for patient activity level. Device programmed to optimize intrinsic conduction. Estimated longevity 5-8 years. ROV with SK 06/30/16.

## 2016-04-19 DIAGNOSIS — H2513 Age-related nuclear cataract, bilateral: Secondary | ICD-10-CM | POA: Diagnosis not present

## 2016-04-19 DIAGNOSIS — E119 Type 2 diabetes mellitus without complications: Secondary | ICD-10-CM | POA: Diagnosis not present

## 2016-04-19 DIAGNOSIS — H353131 Nonexudative age-related macular degeneration, bilateral, early dry stage: Secondary | ICD-10-CM | POA: Diagnosis not present

## 2016-05-23 DIAGNOSIS — E782 Mixed hyperlipidemia: Secondary | ICD-10-CM | POA: Diagnosis not present

## 2016-05-23 DIAGNOSIS — E1142 Type 2 diabetes mellitus with diabetic polyneuropathy: Secondary | ICD-10-CM | POA: Diagnosis not present

## 2016-05-24 DIAGNOSIS — E1142 Type 2 diabetes mellitus with diabetic polyneuropathy: Secondary | ICD-10-CM | POA: Diagnosis not present

## 2016-05-24 DIAGNOSIS — Z125 Encounter for screening for malignant neoplasm of prostate: Secondary | ICD-10-CM | POA: Diagnosis not present

## 2016-05-24 DIAGNOSIS — Z79899 Other long term (current) drug therapy: Secondary | ICD-10-CM | POA: Diagnosis not present

## 2016-05-24 DIAGNOSIS — I10 Essential (primary) hypertension: Secondary | ICD-10-CM | POA: Diagnosis not present

## 2016-05-24 DIAGNOSIS — E782 Mixed hyperlipidemia: Secondary | ICD-10-CM | POA: Diagnosis not present

## 2016-06-30 ENCOUNTER — Ambulatory Visit (INDEPENDENT_AMBULATORY_CARE_PROVIDER_SITE_OTHER): Payer: Medicare Other | Admitting: Internal Medicine

## 2016-06-30 ENCOUNTER — Encounter: Payer: Self-pay | Admitting: Internal Medicine

## 2016-06-30 VITALS — BP 142/68 | HR 69 | Ht 72.0 in | Wt 202.2 lb

## 2016-06-30 DIAGNOSIS — I442 Atrioventricular block, complete: Secondary | ICD-10-CM | POA: Diagnosis not present

## 2016-06-30 DIAGNOSIS — R030 Elevated blood-pressure reading, without diagnosis of hypertension: Secondary | ICD-10-CM | POA: Diagnosis not present

## 2016-06-30 DIAGNOSIS — Z950 Presence of cardiac pacemaker: Secondary | ICD-10-CM

## 2016-06-30 NOTE — Progress Notes (Signed)
      Patient Care Team: Lorne Skeens, MD as PCP - General (Endocrinology)   HPI  Nathan [name] is a 75 y.o. male seen in followup for pacemaker implantation for intermittent complete hear block undertaken in January 2010.   The patient denies chest pain, shortness of breath, nocturnal dyspnea, orthopnea or peripheral edema.  There have been no palpitations, lightheadedness or syncope.   3/17 Echo EF 65%    Past Medical History:  Diagnosis Date  . Atrioventricular block, 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   Qualifier: Diagnosis of  By: [name], MD, [name]    . DM (diabetes mellitus) ([place])   . HTN (hypertension)   . Hyperlipidemia   . Nephrolithiasis   . Pacemaker-Medtronic 05/10/2011   Medtronic device implanted January 2010   . Vertigo     Past Surgical History:  Procedure Laterality Date  . APPENDECTOMY    . PACEMAKER PLACEMENT      Current Outpatient Prescriptions  Medication Sig Dispense Refill  . aspirin 81 MG tablet Take 81 mg by mouth daily.      Marland Kitchen atorvastatin (LIPITOR) 40 MG tablet Take 40 mg by mouth 2 (two) times daily.  4  . carvedilol (COREG) 25 MG tablet Take 25 mg by mouth 2 (two) times daily with a meal.    . glimepiride (AMARYL) 1 MG tablet Take 1 mg by mouth 2 (two) times daily.     . metFORMIN (GLUCOPHAGE) 1000 MG tablet Take 1,000 mg by mouth 2 (two) times daily with a meal.      . ZETIA 10 MG tablet Take 10 mg by mouth daily.      No current facility-administered medications for this visit.     Allergies  Allergen Reactions  . Niacin     REACTION: flushing  . Sulfonamide Derivatives Other (See Comments)    Unknown reaction to medication    Review of Systems negative except from HPI and PMH  Physical Exam BP (!) 142/68   Pulse 69   Ht 6' (1.829 m)   Wt 202 lb 3.2 oz (91.7 kg)   SpO2 97%   BMI 27.42 kg/m  Well developed and nourished in no acute distress HENT normal Neck supple with JVP-flat Clear Device pocket well  healed; without hematoma or erythema.  There is no tethering Regular rate and rhythm, no murmurs or gallops Abd-soft with active BS No Clubbing cyanosis edema Skin-warm and dry A & Oriented  Grossly normal sensory and motor function  ECG  AV pacing   Assessment and  Plan  Complete heart block now persisting  Pacemaker-Medtronic The patient's device was interrogated.  The information was reviewed. No changes were made in the programming.   The lower Elevated blood pressure  Diabetes   Device function is normal.  Device dependent LV function normal 3/17  Blood pressure is elevated today. He is on carvedilol. I'm not sure why he is not on an ACE inhibitor for renal protection in the context of his diabetes. I've asked him to address this with Dr. [name].

## 2016-06-30 NOTE — Patient Instructions (Addendum)
Medication Instructions:  Your physician recommends that you continue on your current medications as directed. Please refer to the Current Medication list given to you today.   Labwork: none  Testing/Procedures: none  Follow-Up: Your physician wants you to follow-up in: 9 months with [facility]. You will receive a reminder letter in the mail two months in advance. If you don't receive a letter, please call our office to schedule the follow-up appointment.  Your physician recommends that you schedule a follow-up appointment in: 3 months in [place] check    Any Other Special Instructions Will Be Listed Below (If Applicable).     If you need a refill on your cardiac medications before your next appointment, please call your pharmacy.

## 2016-07-01 LAB — CUP PACEART INCLINIC DEVICE CHECK
Battery Impedance: 534 Ohm
Battery Remaining Longevity: 76 mo
Brady Statistic AP VP Percent: 15 %
Brady Statistic AS VP Percent: 85 %
Brady Statistic AS VS Percent: 0 %
Implantable Lead Implant Date: 20100125
Implantable Lead Location: 753859
Implantable Lead Location: 753860
Implantable Lead Model: 5076
Implantable Lead Model: 5076
Implantable Pulse Generator Implant Date: 20100125
Lead Channel Impedance Value: 411 Ohm
Lead Channel Impedance Value: 556 Ohm
Lead Channel Pacing Threshold Amplitude: 0.5 V
Lead Channel Pacing Threshold Amplitude: 0.75 V
Lead Channel Sensing Intrinsic Amplitude: 1 mV
MDC IDC LEAD IMPLANT DT: 20100125
MDC IDC MSMT BATTERY VOLTAGE: 2.78 V
MDC IDC MSMT LEADCHNL RA PACING THRESHOLD PULSEWIDTH: 0.4 ms
MDC IDC MSMT LEADCHNL RV PACING THRESHOLD PULSEWIDTH: 0.4 ms
MDC IDC SESS DTM: 20171130205928
MDC IDC SET LEADCHNL RA PACING AMPLITUDE: 2 V
MDC IDC SET LEADCHNL RV PACING AMPLITUDE: 2.5 V
MDC IDC SET LEADCHNL RV PACING PULSEWIDTH: 0.4 ms
MDC IDC SET LEADCHNL RV SENSING SENSITIVITY: 5.6 mV
MDC IDC STAT BRADY AP VS PERCENT: 0 %

## 2016-07-04 ENCOUNTER — Telehealth: Payer: Self-pay | Admitting: Internal Medicine

## 2016-07-04 NOTE — Telephone Encounter (Signed)
Pt gave a message to Dr. [name] from Dr. Caryl Comes asking if he can add an ACI or ARB to pt's medication regimen -pt has problems with the meds in the past and That's why he is not on them now-any questions pls call [phone]

## 2016-07-05 NOTE — Telephone Encounter (Signed)
To Dr. Klein to review. 

## 2016-07-07 NOTE — Telephone Encounter (Signed)
Per Dr. Caryl Comes- information noted.

## 2016-08-10 DIAGNOSIS — M545 Low back pain: Secondary | ICD-10-CM | POA: Diagnosis not present

## 2016-08-26 DIAGNOSIS — Z125 Encounter for screening for malignant neoplasm of prostate: Secondary | ICD-10-CM | POA: Diagnosis not present

## 2016-08-26 DIAGNOSIS — E1142 Type 2 diabetes mellitus with diabetic polyneuropathy: Secondary | ICD-10-CM | POA: Diagnosis not present

## 2016-08-26 DIAGNOSIS — E782 Mixed hyperlipidemia: Secondary | ICD-10-CM | POA: Diagnosis not present

## 2016-08-29 DIAGNOSIS — I10 Essential (primary) hypertension: Secondary | ICD-10-CM | POA: Diagnosis not present

## 2016-08-29 DIAGNOSIS — Z79899 Other long term (current) drug therapy: Secondary | ICD-10-CM | POA: Diagnosis not present

## 2016-08-29 DIAGNOSIS — E1142 Type 2 diabetes mellitus with diabetic polyneuropathy: Secondary | ICD-10-CM | POA: Diagnosis not present

## 2016-08-29 DIAGNOSIS — Z125 Encounter for screening for malignant neoplasm of prostate: Secondary | ICD-10-CM | POA: Diagnosis not present

## 2016-08-29 DIAGNOSIS — E782 Mixed hyperlipidemia: Secondary | ICD-10-CM | POA: Diagnosis not present

## 2016-09-29 ENCOUNTER — Ambulatory Visit (INDEPENDENT_AMBULATORY_CARE_PROVIDER_SITE_OTHER): Payer: Medicare Other | Admitting: *Deleted

## 2016-09-29 DIAGNOSIS — I442 Atrioventricular block, complete: Secondary | ICD-10-CM | POA: Diagnosis not present

## 2016-09-29 LAB — CUP PACEART INCLINIC DEVICE CHECK
Battery Voltage: 2.78 V
Brady Statistic AP VS Percent: 0 %
Brady Statistic AS VP Percent: 87 %
Brady Statistic AS VS Percent: 0 %
Implantable Lead Implant Date: 20100125
Implantable Lead Location: 753860
Implantable Pulse Generator Implant Date: 20100125
Lead Channel Pacing Threshold Amplitude: 0.5 V
Lead Channel Pacing Threshold Amplitude: 0.5 V
Lead Channel Pacing Threshold Pulse Width: 0.4 ms
Lead Channel Pacing Threshold Pulse Width: 0.4 ms
Lead Channel Setting Pacing Amplitude: 2 V
Lead Channel Setting Pacing Amplitude: 2.5 V
Lead Channel Setting Pacing Pulse Width: 0.4 ms
Lead Channel Setting Sensing Sensitivity: 5.6 mV
MDC IDC LEAD IMPLANT DT: 20100125
MDC IDC LEAD LOCATION: 753859
MDC IDC MSMT BATTERY IMPEDANCE: 606 Ohm
MDC IDC MSMT BATTERY REMAINING LONGEVITY: 72 mo
MDC IDC MSMT LEADCHNL RA IMPEDANCE VALUE: 549 Ohm
MDC IDC MSMT LEADCHNL RA SENSING INTR AMPL: 0.5 mV
MDC IDC MSMT LEADCHNL RV IMPEDANCE VALUE: 406 Ohm
MDC IDC SESS DTM: 20180301092148
MDC IDC STAT BRADY AP VP PERCENT: 13 %

## 2016-09-29 NOTE — Progress Notes (Signed)
  Pacemaker check in clinic. Normal device function. Thresholds, sensing, impedances consistent with previous measurements. Device programmed to maximize longevity. No mode switch or high ventricular rates noted. Device programmed at appropriate safety margins. Histogram distribution appropriate for patient activity level. Device programmed to optimize intrinsic conduction. Estimated longevity 6 years. Patient education completed. ROV with AS 03/2017.

## 2017-01-26 DIAGNOSIS — E1142 Type 2 diabetes mellitus with diabetic polyneuropathy: Secondary | ICD-10-CM | POA: Diagnosis not present

## 2017-01-26 DIAGNOSIS — E782 Mixed hyperlipidemia: Secondary | ICD-10-CM | POA: Diagnosis not present

## 2017-01-27 DIAGNOSIS — Z79899 Other long term (current) drug therapy: Secondary | ICD-10-CM | POA: Diagnosis not present

## 2017-01-27 DIAGNOSIS — Z125 Encounter for screening for malignant neoplasm of prostate: Secondary | ICD-10-CM | POA: Diagnosis not present

## 2017-01-27 DIAGNOSIS — I10 Essential (primary) hypertension: Secondary | ICD-10-CM | POA: Diagnosis not present

## 2017-01-27 DIAGNOSIS — E1142 Type 2 diabetes mellitus with diabetic polyneuropathy: Secondary | ICD-10-CM | POA: Diagnosis not present

## 2017-01-27 DIAGNOSIS — E782 Mixed hyperlipidemia: Secondary | ICD-10-CM | POA: Diagnosis not present

## 2017-02-28 NOTE — Progress Notes (Signed)
Electrophysiology Office Note Date: 03/02/2017  ID:  Nathan [name], DOB [birth year], MRN [id number]  PCP: Lorne Skeens, MD Electrophysiologist: Caryl Comes  CC: Pacemaker follow-up  Nathan [name] is a 76 y.o. male seen today for Dr Caryl Comes.  He presents today for routine electrophysiology followup.  Since last being seen in our clinic, the patient reports doing very well.  He denies chest pain, palpitations, dyspnea, PND, orthopnea, nausea, vomiting, dizziness, syncope, edema, weight gain, or early satiety.  Device History: MDT dual chamber PPM implanted 2010 for complete heart block   Past Medical History:  Diagnosis Date  . Atrioventricular block, 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   Qualifier: Diagnosis of  By: [name], MD, [name]    . DM (diabetes mellitus) ([place])   . HTN (hypertension)   . Hyperlipidemia   . Nephrolithiasis   . Pacemaker-Medtronic 05/10/2011   Medtronic device implanted January 2010   . Vertigo    Past Surgical History:  Procedure Laterality Date  . APPENDECTOMY    . PACEMAKER PLACEMENT      Current Outpatient Prescriptions  Medication Sig Dispense Refill  . aspirin 81 MG tablet Take 81 mg by mouth daily.      Marland Kitchen atorvastatin (LIPITOR) 40 MG tablet Take 40 mg by mouth 2 (two) times daily.  4  . carvedilol (COREG) 25 MG tablet Take 25 mg by mouth 2 (two) times daily with a meal.    . metFORMIN (GLUCOPHAGE) 1000 MG tablet Take 1,000 mg by mouth 2 (two) times daily with a meal.      . nateglinide (STARLIX) 120 MG tablet Take 120 mg by mouth 2 (two) times daily as needed.    Marland Kitchen ZETIA 10 MG tablet Take 10 mg by mouth daily.      No current facility-administered medications for this visit.     Allergies:   Niacin and Sulfonamide derivatives   Social History: Social History   Social History  . Marital status: Married    Spouse name: N/A  . Number of children: N/A  . Years of education: N/A   Occupational History  . Not on file.   Social  History Main Topics  . Smoking status: Never Smoker  . Smokeless tobacco: Never Used  . Alcohol use Yes     Comment: occasional  . Drug use: No  . Sexual activity: Not on file   Other Topics Concern  . Not on file   Social History Narrative  . No narrative on file    Family History: Family History  Problem Relation Age of Onset  . Cancer - Colon Father      Review of Systems: All other systems reviewed and are otherwise negative except as noted above.   Physical Exam: VS:  BP 120/62   Pulse 60   Ht 6' (1.829 m)   Wt 200 lb 12.8 oz (91.1 kg)   SpO2 98%   BMI 27.23 kg/m  , BMI Body mass index is 27.23 kg/m.  GEN- The patient is elderly appearing, alert and oriented x 3 today.   HEENT: normocephalic, atraumatic; sclera clear, conjunctiva pink; hearing intact; oropharynx clear; neck supple  Lungs- Clear to ausculation bilaterally, normal work of breathing.  No wheezes, rales, rhonchi Heart- Regular rate and rhythm (paced) GI- soft, non-tender, non-distended, bowel sounds present  Extremities- no clubbing, cyanosis, or edema  MS- no significant deformity or atrophy Skin- warm and dry, no rash or lesion; PPM pocket well healed Psych- euthymic mood, full affect Neuro-  strength and sensation are intact  PPM Interrogation- reviewed in detail today,  See PACEART report  EKG:  EKG is not ordered today.  Recent Labs: No results found for requested labs within last 8760 hours.   Wt Readings from Last 3 Encounters:  03/02/17 200 lb 12.8 oz (91.1 kg)  06/30/16 202 lb 3.2 oz (91.7 kg)  06/23/15 204 lb (92.5 kg)     Other studies Reviewed: Additional studies/ records that were reviewed today include:Dr Klein's office notes  Assessment and Plan:  1.  Complete heart block Normal PPM function - pacemaker dependent  See Pace Art report No changes today No symptoms to suggest RV pacing induced cardiomyopathy He politely declined remote monitoring   2.   HTN Stable No change required today    Current medicines are reviewed at length with the patient today.   The patient does not have concerns regarding his medicines.  The following changes were made today:  none  Labs/ tests ordered today include: none No orders of the defined types were placed in this encounter.    Disposition:   Follow up with device clinic 6 months,  Dr Caryl Comes 1 year    Signed, Chanetta Marshall, NP 03/02/2017 10:59 AM  [facility] [address] [place] [address] [phone] (office) [phone] (fax)

## 2017-03-02 ENCOUNTER — Encounter: Payer: Self-pay | Admitting: Nurse Practitioner

## 2017-03-02 ENCOUNTER — Ambulatory Visit (INDEPENDENT_AMBULATORY_CARE_PROVIDER_SITE_OTHER): Payer: Medicare Other | Admitting: Nurse Practitioner

## 2017-03-02 VITALS — BP 120/62 | HR 60 | Ht 72.0 in | Wt 200.8 lb

## 2017-03-02 DIAGNOSIS — I442 Atrioventricular block, complete: Secondary | ICD-10-CM | POA: Diagnosis not present

## 2017-03-02 DIAGNOSIS — I10 Essential (primary) hypertension: Secondary | ICD-10-CM | POA: Diagnosis not present

## 2017-03-02 LAB — CUP PACEART INCLINIC DEVICE CHECK
Implantable Lead Implant Date: 20100125
Implantable Lead Implant Date: 20100125
Implantable Lead Location: 753860
Implantable Lead Model: 5076
Implantable Lead Model: 5076
Implantable Pulse Generator Implant Date: 20100125
MDC IDC LEAD LOCATION: 753859
MDC IDC SESS DTM: 20180802110114

## 2017-03-02 NOTE — Patient Instructions (Signed)
Medication Instructions:  Your physician recommends that you continue on your current medications as directed. Please refer to the Current Medication list given to you today.   Labwork: None Ordered   Testing/Procedures: None Ordered   Follow-Up: Your physician recommends that you schedule a follow-up appointment in: 6 months with Device clinic  Your physician wants you to follow-up in: 1 year with Dr. Caryl Comes. You will receive a reminder letter in the mail two months in advance. If you don't receive a letter, please call our office to schedule the follow-up appointment.   Any Other Special Instructions Will Be Listed Below (If Applicable).     If you need a refill on your cardiac medications before your next appointment, please call your pharmacy.

## 2017-03-31 DIAGNOSIS — L218 Other seborrheic dermatitis: Secondary | ICD-10-CM | POA: Diagnosis not present

## 2017-04-09 ENCOUNTER — Encounter (HOSPITAL_COMMUNITY): Payer: Self-pay | Admitting: Emergency Medicine

## 2017-04-09 ENCOUNTER — Emergency Department (HOSPITAL_COMMUNITY)
Admission: EM | Admit: 2017-04-09 | Discharge: 2017-04-10 | Disposition: A | Discharge status: Home / Self Care | Payer: Medicare Other | Attending: Emergency Medicine | Admitting: Emergency Medicine

## 2017-04-09 DIAGNOSIS — Z79899 Other long term (current) drug therapy: Secondary | ICD-10-CM | POA: Diagnosis not present

## 2017-04-09 DIAGNOSIS — Z950 Presence of cardiac pacemaker: Secondary | ICD-10-CM | POA: Insufficient documentation

## 2017-04-09 DIAGNOSIS — I10 Essential (primary) hypertension: Secondary | ICD-10-CM | POA: Insufficient documentation

## 2017-04-09 DIAGNOSIS — Z7984 Long term (current) use of oral hypoglycemic drugs: Secondary | ICD-10-CM | POA: Insufficient documentation

## 2017-04-09 DIAGNOSIS — R002 Palpitations: Secondary | ICD-10-CM

## 2017-04-09 DIAGNOSIS — Z7982 Long term (current) use of aspirin: Secondary | ICD-10-CM | POA: Insufficient documentation

## 2017-04-09 DIAGNOSIS — E119 Type 2 diabetes mellitus without complications: Secondary | ICD-10-CM | POA: Diagnosis not present

## 2017-04-09 LAB — BASIC METABOLIC PANEL
ANION GAP: 7 (ref 5–15)
BUN: 15 mg/dL (ref 6–20)
CHLORIDE: 104 mmol/L (ref 101–111)
CO2: 28 mmol/L (ref 22–32)
Calcium: 9.2 mg/dL (ref 8.9–10.3)
Creatinine, Ser: 0.77 mg/dL (ref 0.61–1.24)
GFR calc Af Amer: >60 mL/min (ref >60)
Glucose, Bld: 133 mg/dL — ABNORMAL HIGH (ref 65–99)
POTASSIUM: 4.3 mmol/L (ref 3.5–5.1)
SODIUM: 139 mmol/L (ref 135–145)

## 2017-04-09 LAB — CBC
HEMATOCRIT: 42.1 % (ref 39.0–52.0)
HEMOGLOBIN: 14.6 g/dL (ref 13.0–17.0)
MCH: 32.6 pg (ref 26.0–34.0)
MCHC: 34.7 g/dL (ref 30.0–36.0)
MCV: 94 fL (ref 78.0–100.0)
Platelets: 208 10*3/uL (ref 150–400)
RBC: 4.48 MIL/uL (ref 4.22–5.81)
RDW: 13 % (ref 11.5–15.5)
WBC: 7.5 10*3/uL (ref 4.0–10.5)

## 2017-04-09 LAB — I-STAT TROPONIN, ED: Troponin i, poc: 0.17 ng/mL (ref 0.00–0.08)

## 2017-04-09 LAB — TROPONIN I

## 2017-04-09 NOTE — ED Triage Notes (Signed)
Pt comes in with complaints of HTN. Has hx of HTN, DM, and has a pacemaker.  States this afternoon he didn't feel normal so he took his blood pressure and it was elevated compared to normal. Denies chest pain, dizziness, or N&V.

## 2017-04-09 NOTE — ED Notes (Signed)
Pacemaker interrogation completed 

## 2017-04-10 ENCOUNTER — Telehealth: Payer: Self-pay | Admitting: Internal Medicine

## 2017-04-10 DIAGNOSIS — I10 Essential (primary) hypertension: Secondary | ICD-10-CM | POA: Diagnosis not present

## 2017-04-10 MED ORDER — LISINOPRIL 10 MG PO TABS
10.0000 mg | ORAL_TABLET | Freq: Once | ORAL | Status: AC
  Administered 2017-04-10: 10 mg via ORAL
  Filled 2017-04-10: qty 1

## 2017-04-10 MED ORDER — LISINOPRIL 10 MG PO TABS: 10.0000 mg | ORAL_TABLET | Freq: Every day | ORAL | 0 refills | Status: DC

## 2017-04-10 NOTE — Telephone Encounter (Signed)
Patient calling and states that he was seen in the ER last night. He states that his BP was elevated yesterday at 194/104. Patient states that he was discharged and was told to start lisinopril 10 mg QD and to get an appointment for close follow up with Dr. Caryl Comes. Patient denies having any symptoms today and states that his BP is 121/69. Advised for the patient to continue taking the lisinopril and to let us know if he develops any symptoms or if his BP increases. Made patient aware that a message would be sent to the scheduler to get him an appointment. Patient verbalized understanding and thanked me for the call.

## 2017-04-10 NOTE — Telephone Encounter (Signed)
Pt c/o BP issue: STAT if pt c/o blurred vision, one-sided weakness or slurred speech  1. What are your last 5 BP readings? 194/104  2. Are you having any other symptoms (ex. Dizziness, headache, blurred vision, passed out)? no  3. What is your BP issue? High, pt was at ED for 4 hours, he said that it said on discharge notes pt needs to be rechecked for symptoms

## 2017-04-10 NOTE — ED Provider Notes (Signed)
Nathan [name]   CSN: [id number] Arrival date & time: 04/09/17  2121     History   Chief Complaint Chief Complaint  Patient presents with  . Hypertension    HPI Nathan [name] is a 76 y.o. male.  Patient with a history of T2DM, HTN, HLD, pacemaker secondary to complete heart block (2010) presents with a feeling he was skipping heart beats. He denies pain at any time. No SOB, diaphoresis, nausea or weakness. He reports he recently had a routine office visit for his pacemaker which found no issues.    The history is provided by the patient. No language interpreter was used.  Hypertension  Pertinent negatives include no chest pain and no shortness of breath.    Past Medical History:  Diagnosis Date  . Atrioventricular block, 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   Qualifier: Diagnosis of  By: [name], MD, [name]    . DM (diabetes mellitus) ([place])   . HTN (hypertension)   . Hyperlipidemia   . Nephrolithiasis   . Pacemaker-Medtronic 05/10/2011   Medtronic device implanted January 2010   . Vertigo     Patient Active Problem List   Diagnosis Date Noted  . Pacemaker-Medtronic 05/10/2011  . OVERWEIGHT 03/30/2009  . Atrioventricular block, 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    Past Surgical History:  Procedure Laterality Date  . APPENDECTOMY    . PACEMAKER PLACEMENT         Home Medications    Prior to Admission medications   Medication Sig Start Date End Date Taking? Authorizing Provider  aspirin 81 MG tablet Take 81 mg by mouth daily.      [provider]  atorvastatin (LIPITOR) 40 MG tablet Take 40 mg by mouth 2 (two) times daily. 05/24/14   [provider]  carvedilol (COREG) 25 MG tablet Take 25 mg by mouth 2 (two) times daily with a meal.    [provider]  metFORMIN (GLUCOPHAGE) 1000 MG tablet Take 1,000 mg by mouth 2 (two) times daily with a meal.      [provider]  nateglinide (STARLIX) 120 MG tablet  Take 120 mg by mouth 2 (two) times daily as needed.    [provider]  ZETIA 10 MG tablet Take 10 mg by mouth daily.  05/06/11   [provider]    Family History Family History  Problem Relation Age of Onset  . Cancer - Colon Father     Social History Social History  Substance Use Topics  . Smoking status: Never Smoker  . Smokeless tobacco: Never Used  . Alcohol use Yes     Comment: occasional     Allergies   Niacin and Sulfonamide derivatives   Review of Systems Review of Systems  Constitutional: Negative for chills, diaphoresis and fever.  Respiratory: Negative.  Negative for shortness of breath.   Cardiovascular: Positive for palpitations. Negative for chest pain and leg swelling.  Gastrointestinal: Negative.  Negative for nausea.  Musculoskeletal: Negative.   Neurological: Negative.      Physical Exam Updated Vital Signs BP (!) 170/72 (BP Location: Right Arm)   Pulse 66   Temp 98 F (36.7 C) (Oral)   Resp 13   Ht 6' (1.829 m)   Wt 88.5 kg (195 lb)   SpO2 98%   BMI 26.45 kg/m   Physical Exam  Constitutional: He is oriented to person, place, and time. He appears well-developed and well-nourished.  HENT:  Head: Normocephalic.  Neck: Normal range of motion. Neck supple.  Cardiovascular: Normal rate and regular rhythm.   No murmur heard. Pulmonary/Chest: Effort normal and breath sounds normal. He has no wheezes. He has no rales.  Pacer in upper left chest.   Abdominal: Soft. Bowel sounds are normal. There is no tenderness. There is no rebound and no guarding.  Musculoskeletal: Normal range of motion. He exhibits no edema.  Neurological: He is alert and oriented to person, place, and time.  Skin: Skin is warm and dry. No rash noted.  Psychiatric: He has a normal mood and affect.     ED Treatments / Results  Labs (all labs ordered are listed, but only abnormal results are displayed) Labs Reviewed  BASIC METABOLIC PANEL - Abnormal;  Notable for the following:       Result Value   Glucose, Bld 133 (*)    All other components within normal limits  I-STAT TROPONIN, ED - Abnormal; Notable for the following:    Troponin i, poc 0.17 (*)    All other components within normal limits  CBC  TROPONIN I   Results for orders placed or performed during the hospital encounter of 76/16/07  Basic metabolic panel  Result Value Ref Range   Sodium 139 135 - 145 mmol/L   Potassium 4.3 3.5 - 5.1 mmol/L   Chloride 104 101 - 111 mmol/L   CO2 28 22 - 32 mmol/L   Glucose, Bld 133 (H) 65 - 99 mg/dL   BUN 15 6 - 20 mg/dL   Creatinine, Ser 0.77 0.61 - 1.24 mg/dL   Calcium 9.2 8.9 - 10.3 mg/dL   GFR calc non Af Amer >60 >60 mL/min   GFR calc Af Amer >60 >60 mL/min   Anion gap 7 5 - 15  CBC  Result Value Ref Range   WBC 7.5 4.0 - 10.5 K/uL   RBC 4.48 4.22 - 5.81 MIL/uL   Hemoglobin 14.6 13.0 - 17.0 g/dL   HCT 42.1 39.0 - 52.0 %   MCV 94.0 78.0 - 100.0 fL   MCH 32.6 26.0 - 34.0 pg   MCHC 34.7 30.0 - 36.0 g/dL   RDW 13.0 11.5 - 15.5 %   Platelets 208 150 - 400 K/uL  Troponin I  Result Value Ref Range   Troponin I <0.03 <0.03 ng/mL  I-stat troponin, ED  Result Value Ref Range   Troponin i, poc 0.17 (HH) 0.00 - 0.08 ng/mL   Comment NOTIFIED PHYSICIAN    Comment 3            EKG  EKG Interpretation  Date/Time:  Sunday April 09 2017 22:21:32 EDT Ventricular Rate:  61 PR Interval:    QRS Duration: 122 QT Interval:  415 QTC Calculation: 418 R Axis:   0 Text Interpretation:  A-V dual-paced rhythm with some inhibition No further analysis attempted due to paced rhythm Baseline wander in lead(s) V3 Confirmed by Nat Christen [phone]) on 04/09/2017 10:35:00 PM       Radiology No results found.  Procedures Procedures (including critical care time)  Medications Ordered in ED Medications - No data to display   Initial Impression / Assessment and Plan / ED Course  I have reviewed the triage vital signs and the nursing  notes.  Pertinent labs & imaging results that were available during my care of the patient were reviewed by me and considered in my medical decision making (see chart for details).     Patient presents with c/o palpitations with h/o pacer. No pain, SOB, diaphoresis, nausea.  No history of ischemic heart disease.   Initial troponin (I-stat) was elevated to 0.19. Lab troponin performed and found to be normal (0.03). EKG shows paced rhythm only. The patient has been asymptomatic while here.  Discussed with cardiology, Dr. Eula Fried, who advises he is stable for discharge home and close office follow up with Dr. Caryl Comes. He also advises starting on 10 mg Lisinopril - blood pressure 170/72.   Final Clinical Impressions(s) / ED Diagnoses   Final diagnoses:  None   1. Palpitations 2. Hypertension  New Prescriptions New Prescriptions   No medications on file     Charlann Lange, Hershal Coria 04/10/17 1103    Nat Christen, MD 04/15/17 1102

## 2017-04-11 NOTE — Telephone Encounter (Signed)
New Message   Pt is very concerned with bp, would like call back asap, how is he suppose to be taking medication , together or separate ?  Pt c/o BP issue: STAT if pt c/o blurred vision, one-sided weakness or slurred speech  1. What are your last 5 BP readings?  196/110 last night  110/60 this morning (these spikes and drops in bp have him concerned)   2. Are you having any other symptoms (ex. Dizziness, headache, blurred vision, passed out)?  Lightheaded when it is low , feels anxious when it is high  3. What is your BP issue? Not controlled   carvedilol (COREG) 25 MG tablet Take 25 mg by mouth 2 (two) times daily with a meal.    lisinopril (PRINIVIL,ZESTRIL) 10 MG tablet Take 1 tablet (10 mg total) by mouth daily.

## 2017-04-11 NOTE — Telephone Encounter (Signed)
I left a message for the patient to call.  Tentatively scheduled follow up with Dr. Caryl Comes for 04/12/17 at 1:45 pm.  Will await patient call back to confirm.

## 2017-04-11 NOTE — Telephone Encounter (Signed)
Melissa,  Would get him in on Amber/ Renee's schedule next available. - they can decide if BP follow up can be managed by the patient's PCP.   Thanks!

## 2017-04-11 NOTE — Telephone Encounter (Signed)
Pt called back to confirm appt

## 2017-04-11 NOTE — Telephone Encounter (Signed)
Noted  

## 2017-04-12 ENCOUNTER — Ambulatory Visit (INDEPENDENT_AMBULATORY_CARE_PROVIDER_SITE_OTHER): Payer: Medicare Other | Admitting: Internal Medicine

## 2017-04-12 ENCOUNTER — Encounter: Payer: Self-pay | Admitting: Internal Medicine

## 2017-04-12 VITALS — BP 152/68 | HR 81 | Ht 72.0 in | Wt 195.0 lb

## 2017-04-12 DIAGNOSIS — Z950 Presence of cardiac pacemaker: Secondary | ICD-10-CM | POA: Diagnosis not present

## 2017-04-12 DIAGNOSIS — I442 Atrioventricular block, complete: Secondary | ICD-10-CM | POA: Diagnosis not present

## 2017-04-12 DIAGNOSIS — I10 Essential (primary) hypertension: Secondary | ICD-10-CM

## 2017-04-12 MED ORDER — LISINOPRIL 5 MG PO TABS: 5.0000 mg | ORAL_TABLET | Freq: Every day | ORAL | 3 refills | Status: DC

## 2017-04-12 MED ORDER — CARVEDILOL 12.5 MG PO TABS: ORAL_TABLET | ORAL | 6 refills | Status: DC

## 2017-04-12 NOTE — Patient Instructions (Addendum)
Medication Instructions: - Your physician has recommended you make the following change in your medication:  1) Decrease lisinopril to 5 mg- take 1 tablet by mouth at bedtime 2) Coreg (carvedilol) 25 mg - if morning blood pressure is >140 - take 25 mg in the morning - if morning blood pressure is 110-140 - take 1/2 tablet (12.5 mg) in the morning - if morning blood pressure is < 110 - take none in the morning - take 1 tablet (25 mg) at night  Labwork: - none ordered  Procedures/Testing: - none ordered  Follow-Up: - Your physician recommends that you follow-up as scheduled with the [facility] on: Monday 09/04/17  - Your physician wants you to follow-up in: August 2019 with Dr. Caryl Comes. You will receive a reminder letter in the mail two months in advance. If you don't receive a letter, please call our office to schedule the follow-up appointment.   Any Additional Special Instructions Will Be Listed Below (If Applicable).     If you need a refill on your cardiac medications before your next appointment, please call your pharmacy.

## 2017-04-12 NOTE — Progress Notes (Signed)
Patient Care Team: Altheimer, Legrand Como, MD as PCP - General (Endocrinology)   HPI  Nathan [name] is a 76 y.o. male seen in followup for pacemaker implantation for intermittent complete hear block undertaken in January 2010.   Was seen in the ER w palps, and noted to have BP sys 194- He comes in with records from home blood pressures varying from 90-170. He is also noted prior to his going to the emergency room that he is having palpitations i.e. a skipped beat about once a minute or so.  At the emergency room they gave him lisinopril to add to his carvedilol. The next morning his blood pressures were in the 80s. He has been trying to adjust his medications since.  He has not had lightheadedness or syncope.  He has been extremely anxious about having it for a coronary pick UP WITH all this.  3/17 Echo EF 65%      Past Medical History:  Diagnosis Date  . Atrioventricular block, 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   Qualifier: Diagnosis of  By: [name], MD, [name]    . DM (diabetes mellitus) ([place])   . HTN (hypertension)   . Hyperlipidemia   . Nephrolithiasis   . Pacemaker-Medtronic 05/10/2011   Medtronic device implanted January 2010   . Vertigo     Past Surgical History:  Procedure Laterality Date  . APPENDECTOMY    . PACEMAKER PLACEMENT      Current Outpatient Prescriptions  Medication Sig Dispense Refill  . aspirin 81 MG tablet Take 81 mg by mouth daily.      Marland Kitchen atorvastatin (LIPITOR) 40 MG tablet Take 40 mg by mouth 2 (two) times daily.  4  . carvedilol (COREG) 25 MG tablet Take 1 tablet (25 mg) by mouth twice daily as directed    . metFORMIN (GLUCOPHAGE) 1000 MG tablet Take 1,000 mg by mouth 2 (two) times daily with a meal.      . nateglinide (STARLIX) 120 MG tablet Take 120 mg by mouth 2 (two) times daily as needed.    Marland Kitchen ZETIA 10 MG tablet Take 10 mg by mouth daily.     . carvedilol (COREG) 12.5 MG tablet Take 1 tablet (12.5 mg) by mouth in the  morning for a blood pressure of 110-140 30 tablet 6  . lisinopril (PRINIVIL,ZESTRIL) 5 MG tablet Take 1 tablet (5 mg total) by mouth daily. 90 tablet 3   No current facility-administered medications for this visit.     Allergies  Allergen Reactions  . Niacin     REACTION: flushing  . Sulfonamide Derivatives Other (See Comments)    Unknown reaction to medication    Review of Systems negative except from HPI and PMH  Physical Exam BP (!) 152/68   Pulse 81   Ht 6' (1.829 m)   Wt 195 lb (88.5 kg)   SpO2 98%   BMI 26.45 kg/m  Well developed and nourished in no acute distress HENT normal Neck supple with JVP-flat Clear Device pocket well healed; without hematoma or erythema.  There is no tethering Regular rate and rhythm, no murmurs or gallops Abd-soft with active BS No Clubbing cyanosis edema Skin-warm and dry A & Oriented  Grossly normal sensory and motor function  ECG  AV pacing   Assessment and  Plan  Complete heart block now persisting  Pacemaker-Medtronic The patient's device was interrogated.  The information was reviewed. No changes were made in the programming.    Blood pressure -elevated  and labile  Diabetes   Device function is normal. He is having labile blood pressure  he was started on an ACE inhibitor in the emergency room. This is something I asked him to consider with Dr. Eden Emms for renal protection. The resulted in hypotension.  We have come up with the following plan. We will decrease his lisinopril and have him take 5 mg at night. This is at least consistent with a Hope trial.  We will have the measures and blood pressure is above 140 he will take 25 mg of carvedilol. If it is 110--140 he will take 12.5  less than 110 he will not take any. He'll take 25 mg at night.  More than 50% of 40 min was spent in counseling related to the above

## 2017-04-17 DIAGNOSIS — M545 Low back pain: Secondary | ICD-10-CM | POA: Diagnosis not present

## 2017-04-17 DIAGNOSIS — M542 Cervicalgia: Secondary | ICD-10-CM | POA: Diagnosis not present

## 2017-04-18 ENCOUNTER — Ambulatory Visit: Payer: Medicare Other | Admitting: Physician Assistant

## 2017-04-19 DIAGNOSIS — H353131 Nonexudative age-related macular degeneration, bilateral, early dry stage: Secondary | ICD-10-CM | POA: Diagnosis not present

## 2017-04-19 DIAGNOSIS — H2513 Age-related nuclear cataract, bilateral: Secondary | ICD-10-CM | POA: Diagnosis not present

## 2017-04-19 DIAGNOSIS — H43393 Other vitreous opacities, bilateral: Secondary | ICD-10-CM | POA: Diagnosis not present

## 2017-04-19 DIAGNOSIS — E119 Type 2 diabetes mellitus without complications: Secondary | ICD-10-CM | POA: Diagnosis not present

## 2017-04-20 DIAGNOSIS — M5033 Other cervical disc degeneration, cervicothoracic region: Secondary | ICD-10-CM | POA: Diagnosis not present

## 2017-04-20 DIAGNOSIS — M47896 Other spondylosis, lumbar region: Secondary | ICD-10-CM | POA: Diagnosis not present

## 2017-04-20 DIAGNOSIS — M542 Cervicalgia: Secondary | ICD-10-CM | POA: Diagnosis not present

## 2017-04-20 DIAGNOSIS — M545 Low back pain: Secondary | ICD-10-CM | POA: Diagnosis not present

## 2017-04-24 DIAGNOSIS — M47896 Other spondylosis, lumbar region: Secondary | ICD-10-CM | POA: Diagnosis not present

## 2017-04-24 DIAGNOSIS — M5033 Other cervical disc degeneration, cervicothoracic region: Secondary | ICD-10-CM | POA: Diagnosis not present

## 2017-04-24 DIAGNOSIS — M542 Cervicalgia: Secondary | ICD-10-CM | POA: Diagnosis not present

## 2017-04-24 DIAGNOSIS — M545 Low back pain: Secondary | ICD-10-CM | POA: Diagnosis not present

## 2017-04-25 LAB — CUP PACEART INCLINIC DEVICE CHECK
Brady Statistic AP VP Percent: 20 %
Brady Statistic AP VS Percent: 0 %
Brady Statistic AS VP Percent: 80 %
Brady Statistic AS VS Percent: 0 %
Date Time Interrogation Session: 20180912180246
Implantable Lead Implant Date: 20100125
Implantable Lead Location: 753859
Implantable Lead Model: 5076
Implantable Pulse Generator Implant Date: 20100125
Lead Channel Impedance Value: 424 Ohm
Lead Channel Pacing Threshold Amplitude: 0.5 V
Lead Channel Pacing Threshold Amplitude: 0.625 V
Lead Channel Pacing Threshold Pulse Width: 0.4 ms
Lead Channel Pacing Threshold Pulse Width: 0.4 ms
Lead Channel Setting Pacing Amplitude: 2 V
Lead Channel Setting Pacing Amplitude: 2.5 V
Lead Channel Setting Pacing Pulse Width: 0.4 ms
Lead Channel Setting Sensing Sensitivity: 5.6 mV
MDC IDC LEAD IMPLANT DT: 20100125
MDC IDC LEAD LOCATION: 753860
MDC IDC MSMT BATTERY IMPEDANCE: 732 Ohm
MDC IDC MSMT BATTERY REMAINING LONGEVITY: 65 mo
MDC IDC MSMT BATTERY VOLTAGE: 2.78 V
MDC IDC MSMT LEADCHNL RA IMPEDANCE VALUE: 602 Ohm
MDC IDC MSMT LEADCHNL RA PACING THRESHOLD AMPLITUDE: 0.375 V
MDC IDC MSMT LEADCHNL RA PACING THRESHOLD AMPLITUDE: 0.5 V
MDC IDC MSMT LEADCHNL RA PACING THRESHOLD PULSEWIDTH: 0.4 ms
MDC IDC MSMT LEADCHNL RA SENSING INTR AMPL: 1.4 mV
MDC IDC MSMT LEADCHNL RV PACING THRESHOLD PULSEWIDTH: 0.4 ms

## 2017-04-27 DIAGNOSIS — E782 Mixed hyperlipidemia: Secondary | ICD-10-CM | POA: Diagnosis not present

## 2017-04-27 DIAGNOSIS — E1142 Type 2 diabetes mellitus with diabetic polyneuropathy: Secondary | ICD-10-CM | POA: Diagnosis not present

## 2017-04-28 ENCOUNTER — Other Ambulatory Visit: Payer: Self-pay | Admitting: Sports Medicine

## 2017-04-28 DIAGNOSIS — M5030 Other cervical disc degeneration, unspecified cervical region: Secondary | ICD-10-CM | POA: Diagnosis not present

## 2017-04-28 DIAGNOSIS — M542 Cervicalgia: Secondary | ICD-10-CM

## 2017-05-01 DIAGNOSIS — E1142 Type 2 diabetes mellitus with diabetic polyneuropathy: Secondary | ICD-10-CM | POA: Diagnosis not present

## 2017-05-01 DIAGNOSIS — Z79899 Other long term (current) drug therapy: Secondary | ICD-10-CM | POA: Diagnosis not present

## 2017-05-01 DIAGNOSIS — I442 Atrioventricular block, complete: Secondary | ICD-10-CM | POA: Diagnosis not present

## 2017-05-01 DIAGNOSIS — E782 Mixed hyperlipidemia: Secondary | ICD-10-CM | POA: Diagnosis not present

## 2017-05-01 DIAGNOSIS — Z125 Encounter for screening for malignant neoplasm of prostate: Secondary | ICD-10-CM | POA: Diagnosis not present

## 2017-05-01 DIAGNOSIS — I10 Essential (primary) hypertension: Secondary | ICD-10-CM | POA: Diagnosis not present

## 2017-05-01 DIAGNOSIS — Z950 Presence of cardiac pacemaker: Secondary | ICD-10-CM | POA: Diagnosis not present

## 2017-05-02 ENCOUNTER — Ambulatory Visit
Admission: RE | Admit: 2017-05-02 | Discharge: 2017-05-02 | Disposition: A | Discharge status: Home / Self Care | Payer: Medicare Other | Source: Ambulatory Visit | Attending: Sports Medicine | Admitting: Sports Medicine

## 2017-05-02 DIAGNOSIS — M542 Cervicalgia: Secondary | ICD-10-CM

## 2017-05-02 DIAGNOSIS — M5030 Other cervical disc degeneration, unspecified cervical region: Secondary | ICD-10-CM

## 2017-05-02 DIAGNOSIS — M47812 Spondylosis without myelopathy or radiculopathy, cervical region: Secondary | ICD-10-CM | POA: Diagnosis not present

## 2017-05-08 DIAGNOSIS — M542 Cervicalgia: Secondary | ICD-10-CM | POA: Diagnosis not present

## 2017-05-08 DIAGNOSIS — M5030 Other cervical disc degeneration, unspecified cervical region: Secondary | ICD-10-CM | POA: Diagnosis not present

## 2017-05-08 DIAGNOSIS — M47812 Spondylosis without myelopathy or radiculopathy, cervical region: Secondary | ICD-10-CM | POA: Diagnosis not present

## 2017-05-09 DIAGNOSIS — M47896 Other spondylosis, lumbar region: Secondary | ICD-10-CM | POA: Diagnosis not present

## 2017-05-09 DIAGNOSIS — M542 Cervicalgia: Secondary | ICD-10-CM | POA: Diagnosis not present

## 2017-05-09 DIAGNOSIS — M5033 Other cervical disc degeneration, cervicothoracic region: Secondary | ICD-10-CM | POA: Diagnosis not present

## 2017-05-09 DIAGNOSIS — M545 Low back pain: Secondary | ICD-10-CM | POA: Diagnosis not present

## 2017-05-16 DIAGNOSIS — M47896 Other spondylosis, lumbar region: Secondary | ICD-10-CM | POA: Diagnosis not present

## 2017-05-16 DIAGNOSIS — M545 Low back pain: Secondary | ICD-10-CM | POA: Diagnosis not present

## 2017-05-16 DIAGNOSIS — M542 Cervicalgia: Secondary | ICD-10-CM | POA: Diagnosis not present

## 2017-05-16 DIAGNOSIS — M5033 Other cervical disc degeneration, cervicothoracic region: Secondary | ICD-10-CM | POA: Diagnosis not present

## 2017-05-18 ENCOUNTER — Emergency Department (HOSPITAL_COMMUNITY): Payer: Medicare Other

## 2017-05-18 ENCOUNTER — Emergency Department (HOSPITAL_COMMUNITY)
Admission: EM | Admit: 2017-05-18 | Discharge: 2017-05-18 | Disposition: A | Discharge status: Home / Self Care | Payer: Medicare Other | Attending: Emergency Medicine | Admitting: Emergency Medicine

## 2017-05-18 DIAGNOSIS — R42 Dizziness and giddiness: Secondary | ICD-10-CM | POA: Insufficient documentation

## 2017-05-18 DIAGNOSIS — I10 Essential (primary) hypertension: Secondary | ICD-10-CM | POA: Diagnosis not present

## 2017-05-18 DIAGNOSIS — E785 Hyperlipidemia, unspecified: Secondary | ICD-10-CM | POA: Insufficient documentation

## 2017-05-18 DIAGNOSIS — Z950 Presence of cardiac pacemaker: Secondary | ICD-10-CM | POA: Insufficient documentation

## 2017-05-18 DIAGNOSIS — I951 Orthostatic hypotension: Secondary | ICD-10-CM | POA: Diagnosis not present

## 2017-05-18 DIAGNOSIS — R404 Transient alteration of awareness: Secondary | ICD-10-CM | POA: Diagnosis not present

## 2017-05-18 DIAGNOSIS — Z7982 Long term (current) use of aspirin: Secondary | ICD-10-CM | POA: Insufficient documentation

## 2017-05-18 DIAGNOSIS — E119 Type 2 diabetes mellitus without complications: Secondary | ICD-10-CM | POA: Insufficient documentation

## 2017-05-18 DIAGNOSIS — Z7984 Long term (current) use of oral hypoglycemic drugs: Secondary | ICD-10-CM | POA: Diagnosis not present

## 2017-05-18 LAB — BASIC METABOLIC PANEL
Anion gap: 11 (ref 5–15)
BUN: 13 mg/dL (ref 6–20)
CO2: 25 mmol/L (ref 22–32)
Calcium: 9.2 mg/dL (ref 8.9–10.3)
Chloride: 102 mmol/L (ref 101–111)
Creatinine, Ser: 0.7 mg/dL (ref 0.61–1.24)
GFR calc Af Amer: >60 mL/min (ref >60)
GFR calc non Af Amer: >60 mL/min (ref >60)
Glucose, Bld: 173 mg/dL — ABNORMAL HIGH (ref 65–99)
Potassium: 4.1 mmol/L (ref 3.5–5.1)
Sodium: 138 mmol/L (ref 135–145)

## 2017-05-18 LAB — CBC WITH DIFFERENTIAL/PLATELET
Basophils Absolute: 0 10*3/uL (ref 0.0–0.1)
Basophils Relative: 0 %
Eosinophils Absolute: 0.1 10*3/uL (ref 0.0–0.7)
Eosinophils Relative: 1 %
HCT: 40.7 % (ref 39.0–52.0)
Hemoglobin: 14.2 g/dL (ref 13.0–17.0)
Lymphocytes Relative: 14 %
Lymphs Abs: 1.4 10*3/uL (ref 0.7–4.0)
MCH: 32.3 pg (ref 26.0–34.0)
MCHC: 34.9 g/dL (ref 30.0–36.0)
MCV: 92.7 fL (ref 78.0–100.0)
Monocytes Absolute: 0.8 10*3/uL (ref 0.1–1.0)
Monocytes Relative: 8 %
Neutro Abs: 7.8 10*3/uL — ABNORMAL HIGH (ref 1.7–7.7)
Neutrophils Relative %: 77 %
Platelets: 199 10*3/uL (ref 150–400)
RBC: 4.39 MIL/uL (ref 4.22–5.81)
RDW: 12.4 % (ref 11.5–15.5)
WBC: 10 10*3/uL (ref 4.0–10.5)

## 2017-05-18 MED ORDER — SODIUM CHLORIDE 0.9 % IV BOLUS (SEPSIS)
1000.0000 mL | Freq: Once | INTRAVENOUS | Status: AC
  Administered 2017-05-18: 1000 mL via INTRAVENOUS

## 2017-05-18 NOTE — ED Notes (Signed)
Patient transported to CT 

## 2017-05-18 NOTE — ED Provider Notes (Signed)
[facility] DEPT Provider Note   CSN: [id number] Arrival date & time: 05/18/17  1338     History   Chief Complaint Chief Complaint  Patient presents with  . Dizziness  . Hypertension    HPI Nathan [name] is a 76 y.o. male with history of hypertension, diabetes, complete AV block with pacemaker times 8 years who presents with a 2-day history of dizziness and reported hypertension.  Patient reports he does not have room spinning dizziness, but is feeling off balance and like he is on a boat.  He describes his symptoms as only present when he stands up from a lying down position.  He denies any headache or vision changes.  He also denies any numbness or tingling.  Denies any chest pain, shortness of breath, abdominal pain.  He has had some associated nausea, but no vomiting.  He reports a new medication, lisinopril, for the past 1 month.  He has been taking as prescribed.  He reports he took 10 mg earlier today without reduction in his blood pressure.  He reports checking 4 minutes after he took the medication.  He does have tinnitus, however this is chronic for him.  HPI  Past Medical History:  Diagnosis Date  . Atrioventricular block, 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   Qualifier: Diagnosis of  By: [name], MD, [name]    . DM (diabetes mellitus) ([place])   . HTN (hypertension)   . Hyperlipidemia   . Nephrolithiasis   . Pacemaker-Medtronic 05/10/2011   Medtronic device implanted January 2010   . Vertigo     Patient Active Problem List   Diagnosis Date Noted  . Pacemaker-Medtronic 05/10/2011  . OVERWEIGHT 03/30/2009  . Atrioventricular block, 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    Past Surgical History:  Procedure Laterality Date  . APPENDECTOMY    . PACEMAKER PLACEMENT         Home Medications    Prior to Admission medications   Medication Sig Start Date End Date Taking? Authorizing Provider  aspirin 81 MG tablet Take 81 mg by mouth  daily.     Yes [provider]  atorvastatin (LIPITOR) 40 MG tablet Take 40 mg by mouth 2 (two) times daily. 05/24/14  Yes [provider]  carvedilol (COREG) 12.5 MG tablet Take 1 tablet (12.5 mg) by mouth in the morning for a blood pressure of 110-140 04/12/17  Yes Deboraha Sprang, MD  carvedilol (COREG) 25 MG tablet Take 1 tablet (25 mg) by mouth twice daily as directed   Yes [provider]  HYDROcodone-acetaminophen (NORCO/VICODIN) 5-325 MG tablet Take 1 tablet by mouth daily as needed for moderate pain.  05/17/17  Yes [provider]  lisinopril (PRINIVIL,ZESTRIL) 10 MG tablet Take 10 mg by mouth daily. 04/10/17  Yes [provider]  lisinopril (PRINIVIL,ZESTRIL) 5 MG tablet Take 1 tablet (5 mg total) by mouth daily. 04/12/17 07/11/17 Yes Deboraha Sprang, MD  metFORMIN (GLUCOPHAGE) 1000 MG tablet Take 1,000 mg by mouth 2 (two) times daily with a meal.     Yes [provider]  nateglinide (STARLIX) 120 MG tablet Take 120 mg by mouth 2 (two) times daily as needed.   Yes [provider]  ZETIA 10 MG tablet Take 10 mg by mouth daily.  05/06/11  Yes [provider]    Family History Family History  Problem Relation Age of Onset  . Cancer - Colon Father     Social History Social History  Substance Use Topics  .  Smoking status: Never Smoker  . Smokeless tobacco: Never Used  . Alcohol use Yes     Comment: occasional     Allergies   Niacin and Sulfonamide derivatives   Review of Systems Review of Systems  Constitutional: Negative for chills and fever.  HENT: Negative for facial swelling and sore throat.   Respiratory: Negative for shortness of breath.   Cardiovascular: Negative for chest pain.  Gastrointestinal: Positive for nausea. Negative for abdominal pain and vomiting.  Genitourinary: Negative for dysuria.  Musculoskeletal: Negative for back pain.  Skin: Negative for rash and wound.  Neurological: Positive  for dizziness ("off balance"). Negative for headaches.  Psychiatric/Behavioral: The patient is not nervous/anxious.      Physical Exam Updated Vital Signs BP (!) 147/79   Pulse 71   Temp 98.2 F (36.8 C) (Oral)   Resp 15   Ht 6' (1.829 m)   Wt 85.3 kg (188 lb)   SpO2 96%   BMI 25.50 kg/m   Physical Exam  Constitutional: He appears well-developed and well-nourished. No distress.  HENT:  Head: Normocephalic and atraumatic.  Mouth/Throat: Oropharynx is clear and moist. No oropharyngeal exudate.  Eyes: Pupils are equal, round, and reactive to light. Conjunctivae are normal. Right eye exhibits no discharge. Left eye exhibits no discharge. No scleral icterus.  Neck: Normal range of motion. Neck supple. No thyromegaly present.  Cardiovascular: Normal rate, regular rhythm, normal heart sounds and intact distal pulses.  Exam reveals no gallop and no friction rub.   No murmur heard. Pulmonary/Chest: Effort normal and breath sounds normal. No stridor. No respiratory distress. He has no wheezes. He has no rales.  Abdominal: Soft. Bowel sounds are normal. He exhibits no distension. There is no tenderness. There is no rebound and no guarding.  Musculoskeletal: He exhibits no edema.  Lymphadenopathy:    He has no cervical adenopathy.  Neurological: He is alert. Coordination normal.  CN 3-12 intact; normal sensation throughout; 5/5 strength in all 4 extremities; equal bilateral grip strength; no ataxia on finger to nose; normal heel to shin test  Skin: Skin is warm and dry. No rash noted. He is not diaphoretic. No pallor.  Psychiatric: He has a normal mood and affect.  Nursing note and vitals reviewed.    ED Treatments / Results  Labs (all labs ordered are listed, but only abnormal results are displayed) Labs Reviewed  BASIC METABOLIC PANEL - Abnormal; Notable for the following:       Result Value   Glucose, Bld 173 (*)    All other components within normal limits  CBC WITH  DIFFERENTIAL/PLATELET - Abnormal; Notable for the following:    Neutro Abs 7.8 (*)    All other components within normal limits    EKG  EKG Interpretation  Date/Time:  Thursday May 18 2017 15:20:45 EDT Ventricular Rate:  72 PR Interval:    QRS Duration: 126 QT Interval:  368 QTC Calculation: 403 R Axis:   -24 Text Interpretation:  Atrial-sensed ventricular-paced rhythm No further analysis attempted due to paced rhythm No significant change since last tracing Confirmed by Orlie Dakin [phone]) on 05/18/2017 3:48:31 PM       Radiology Ct Head Wo Contrast  Result Date: 05/18/2017 CLINICAL DATA:  Dizziness EXAM: CT HEAD WITHOUT CONTRAST TECHNIQUE: Contiguous axial images were obtained from the base of the skull through the vertex without intravenous contrast. COMPARISON:  None. FINDINGS: Brain: No evidence of acute infarction, hemorrhage, hydrocephalus, extra-axial collection or mass lesion/mass effect. Mild atrophy  and minimal small vessel ischemic changes of the white matter Vascular: No hyperdense vessels.  Carotid artery calcification. Skull: Normal. Negative for fracture or focal lesion. Sinuses/Orbits: No acute finding. Other: None IMPRESSION: No CT evidence for acute intracranial abnormality. Electronically Signed   By: Donavan Foil M.D.   On: 05/18/2017 15:45    Procedures Procedures (including critical care time)  Medications Ordered in ED Medications  sodium chloride 0.9 % bolus 1,000 mL (0 mLs Intravenous Stopped 05/18/17 1809)     Initial Impression / Assessment and Plan / ED Course  I have reviewed the triage vital signs and the nursing notes.  Pertinent labs & imaging results that were available during my care of the patient were reviewed by me and considered in my medical decision making (see chart for details).     Patient with orthostatic hypotension in the ED.  He is asymptomatic in the ED.  He has a normal  neuro exam without focal deficits.  He was  ambulated without symptoms.  1 L of fluids given.  CT head and labs unremarkable.,  Except glucose 173.  Patient's blood pressure may be elevated lately due to increase in pain in neck.  He admits to not drinking very much fluid (mostly coffee and not very much water), advised increasing oral fluid intake.  Patient also advised to follow-up with cardiology for further evaluation and treatment of blood pressure and management of medications.  Advised no change at this time to control the prior spikes of blood pressure.  I also advised patient to get up from a lying position little more slowly.  Strict return precautions given.  Patient understands and agrees with plan.  Patient vitals stable and discharged in satisfactory condition. I discussed patient case with Dr. Billy Fischer who guided the patient's management and agrees with plan.    Final Clinical Impressions(s) / ED Diagnoses   Final diagnoses:  Dizziness  Orthostatic hypotension    New Prescriptions New Prescriptions   No medications on file     Caryl Ada 05/18/17 Lesly Rubenstein, MD 05/18/17 2115

## 2017-05-18 NOTE — Discharge Instructions (Signed)
Make sure to drink plenty of water.  Please see your cardiologist as soon as possible for further evaluation and treatment of your symptoms.  You were having orthostatic hypotension, which we believe is causing you to have symptoms of feeling off balance when you stand up.  Please return to the emergency department if you develop any new or worsening symptoms.

## 2017-05-18 NOTE — ED Notes (Signed)
Stephan with [employer] called stating that readings at NSR at 73 ventricular pacing rate, with nothing abnormal.

## 2017-05-18 NOTE — ED Notes (Signed)
Spoke with Nicki Reaper at [employer] who confirmed did receive interigation of patient's Psychologist, forensic.

## 2017-05-18 NOTE — ED Notes (Signed)
Bed: GB84 Expected date:  Expected time:  Means of arrival:  Comments: EMS- dizziness/hypertension/pacemaker

## 2017-05-18 NOTE — ED Triage Notes (Addendum)
Pt arrived via [facility] c/o dizziness. Pt stood up from lying down at around 1100 and felt very dizzy and checked blood pressure. Pt has been on lisinopril since the beginning of October. Pt given 10mg  of lisinopril before calling EMS. Pt reports nausea but no vomiting or chest pain. Pt has had pacemaker for 8 years.    Pt quit taking NSAIDS yesterday and reports he is scheduled for a spinal injection next week.

## 2017-05-19 ENCOUNTER — Telehealth: Payer: Self-pay | Admitting: Internal Medicine

## 2017-05-19 NOTE — Telephone Encounter (Signed)
Patient is having an injection next week and had to stop his Celebrex for a week. Patient stated after stopping his Celebrex, his BP went up and he was having dizzy spells. Offer patient an appointment to evaluate his BP, patient declined, stating that he would be back on his Celebrex by the time he would get an appointment. Patient wanted to know if he could get something for anxiety. Informed patient he would need to call his PCP. Referred to instructions from the ED doctor.   "Patient with orthostatic hypotension in the ED.  He is asymptomatic in the ED.  He has a normal  neuro exam without focal deficits.  He was ambulated without symptoms.  1 L of fluids given.  CT head and labs unremarkable.,  Except glucose 173.  Patient's blood pressure may be elevated lately due to increase in pain in neck.  He admits to not drinking very much fluid (mostly coffee and not very much water), advised increasing oral fluid intake.  Patient also advised to follow-up with cardiology for further evaluation and treatment of blood pressure and management of medications.  Advised no change at this time to control the prior spikes of blood pressure.  I also advised patient to get up from a lying position little more slowly.  Strict return precautions given.  Patient understands and agrees with plan.  Patient vitals stable and discharged in satisfactory condition. I discussed patient case with Dr. Billy Fischer who guided the patient's management and agrees with plan."  Patient will call his PCP about anxiety medication, and if he changes his mind about f/u with our office please call.

## 2017-05-19 NOTE — Telephone Encounter (Signed)
Nathan [name] is calling because he is not responding to the medication regiment that he was put on 04/18/17 and he ended  up in the ER last night . Please call

## 2017-05-25 DIAGNOSIS — M542 Cervicalgia: Secondary | ICD-10-CM | POA: Diagnosis not present

## 2017-05-25 DIAGNOSIS — M5033 Other cervical disc degeneration, cervicothoracic region: Secondary | ICD-10-CM | POA: Diagnosis not present

## 2017-06-09 DIAGNOSIS — M47812 Spondylosis without myelopathy or radiculopathy, cervical region: Secondary | ICD-10-CM | POA: Diagnosis not present

## 2017-06-20 DIAGNOSIS — M542 Cervicalgia: Secondary | ICD-10-CM | POA: Diagnosis not present

## 2017-06-20 DIAGNOSIS — M47812 Spondylosis without myelopathy or radiculopathy, cervical region: Secondary | ICD-10-CM | POA: Diagnosis not present

## 2017-07-06 DIAGNOSIS — M542 Cervicalgia: Secondary | ICD-10-CM | POA: Diagnosis not present

## 2017-07-06 DIAGNOSIS — M47812 Spondylosis without myelopathy or radiculopathy, cervical region: Secondary | ICD-10-CM | POA: Diagnosis not present

## 2017-07-13 DIAGNOSIS — M542 Cervicalgia: Secondary | ICD-10-CM | POA: Diagnosis not present

## 2017-07-13 DIAGNOSIS — M4802 Spinal stenosis, cervical region: Secondary | ICD-10-CM | POA: Diagnosis not present

## 2017-07-13 DIAGNOSIS — I10 Essential (primary) hypertension: Secondary | ICD-10-CM | POA: Diagnosis not present

## 2017-07-13 DIAGNOSIS — M47812 Spondylosis without myelopathy or radiculopathy, cervical region: Secondary | ICD-10-CM | POA: Diagnosis not present

## 2017-07-13 DIAGNOSIS — Z6827 Body mass index (BMI) 27.0-27.9, adult: Secondary | ICD-10-CM | POA: Diagnosis not present

## 2017-08-03 DIAGNOSIS — M47812 Spondylosis without myelopathy or radiculopathy, cervical region: Secondary | ICD-10-CM | POA: Diagnosis not present

## 2017-08-14 DIAGNOSIS — M47812 Spondylosis without myelopathy or radiculopathy, cervical region: Secondary | ICD-10-CM | POA: Diagnosis not present

## 2017-08-28 DIAGNOSIS — M47812 Spondylosis without myelopathy or radiculopathy, cervical region: Secondary | ICD-10-CM | POA: Diagnosis not present

## 2017-08-28 DIAGNOSIS — I10 Essential (primary) hypertension: Secondary | ICD-10-CM | POA: Diagnosis not present

## 2017-08-28 DIAGNOSIS — Z6826 Body mass index (BMI) 26.0-26.9, adult: Secondary | ICD-10-CM | POA: Diagnosis not present

## 2017-08-28 DIAGNOSIS — M542 Cervicalgia: Secondary | ICD-10-CM | POA: Diagnosis not present

## 2017-08-28 DIAGNOSIS — M4802 Spinal stenosis, cervical region: Secondary | ICD-10-CM | POA: Diagnosis not present

## 2017-09-04 DIAGNOSIS — M47812 Spondylosis without myelopathy or radiculopathy, cervical region: Secondary | ICD-10-CM | POA: Diagnosis not present

## 2017-09-12 DIAGNOSIS — R293 Abnormal posture: Secondary | ICD-10-CM | POA: Diagnosis not present

## 2017-09-12 DIAGNOSIS — M2560 Stiffness of unspecified joint, not elsewhere classified: Secondary | ICD-10-CM | POA: Diagnosis not present

## 2017-09-12 DIAGNOSIS — M542 Cervicalgia: Secondary | ICD-10-CM | POA: Diagnosis not present

## 2017-09-12 DIAGNOSIS — M6281 Muscle weakness (generalized): Secondary | ICD-10-CM | POA: Diagnosis not present

## 2017-09-13 ENCOUNTER — Ambulatory Visit (INDEPENDENT_AMBULATORY_CARE_PROVIDER_SITE_OTHER): Payer: Medicare Other | Admitting: *Deleted

## 2017-09-13 DIAGNOSIS — I442 Atrioventricular block, complete: Secondary | ICD-10-CM | POA: Diagnosis not present

## 2017-09-14 LAB — CUP PACEART INCLINIC DEVICE CHECK
Battery Impedance: 829 Ohm
Battery Voltage: 2.77 V
Brady Statistic AP VS Percent: 0 %
Implantable Lead Implant Date: 20100125
Implantable Lead Implant Date: 20100125
Implantable Lead Location: 753860
Implantable Lead Model: 5076
Lead Channel Impedance Value: 612 Ohm
Lead Channel Pacing Threshold Amplitude: 0.375 V
Lead Channel Pacing Threshold Amplitude: 0.5 V
Lead Channel Pacing Threshold Amplitude: 0.5 V
Lead Channel Pacing Threshold Amplitude: 0.5 V
Lead Channel Pacing Threshold Pulse Width: 0.4 ms
Lead Channel Pacing Threshold Pulse Width: 0.4 ms
Lead Channel Sensing Intrinsic Amplitude: 1.4 mV
Lead Channel Setting Pacing Amplitude: 2 V
Lead Channel Setting Pacing Amplitude: 2.5 V
Lead Channel Setting Sensing Sensitivity: 5.6 mV
MDC IDC LEAD LOCATION: 753859
MDC IDC MSMT BATTERY REMAINING LONGEVITY: 62 mo
MDC IDC MSMT LEADCHNL RA PACING THRESHOLD PULSEWIDTH: 0.4 ms
MDC IDC MSMT LEADCHNL RV IMPEDANCE VALUE: 431 Ohm
MDC IDC MSMT LEADCHNL RV PACING THRESHOLD PULSEWIDTH: 0.4 ms
MDC IDC PG IMPLANT DT: 20100125
MDC IDC SESS DTM: 20190213190350
MDC IDC SET LEADCHNL RV PACING PULSEWIDTH: 0.4 ms
MDC IDC STAT BRADY AP VP PERCENT: 16 %
MDC IDC STAT BRADY AS VP PERCENT: 84 %
MDC IDC STAT BRADY AS VS PERCENT: 0 %

## 2017-09-14 NOTE — Progress Notes (Signed)
Pacemaker check in clinic. Normal device function. Thresholds, sensing, impedances consistent with previous measurements. Device programmed to maximize longevity. No mode switch or high ventricular rates noted. Device programmed at appropriate safety margins. Histogram distribution appropriate for patient activity level. Device programmed to optimize intrinsic conduction. Estimated longevity 3.5-6.86yrs. Patient declines remotes. ROV with SK recall for 13mo.

## 2017-09-22 DIAGNOSIS — M6281 Muscle weakness (generalized): Secondary | ICD-10-CM | POA: Diagnosis not present

## 2017-09-22 DIAGNOSIS — M2560 Stiffness of unspecified joint, not elsewhere classified: Secondary | ICD-10-CM | POA: Diagnosis not present

## 2017-09-22 DIAGNOSIS — M542 Cervicalgia: Secondary | ICD-10-CM | POA: Diagnosis not present

## 2017-09-22 DIAGNOSIS — R293 Abnormal posture: Secondary | ICD-10-CM | POA: Diagnosis not present

## 2017-10-05 DIAGNOSIS — M47812 Spondylosis without myelopathy or radiculopathy, cervical region: Secondary | ICD-10-CM | POA: Diagnosis not present

## 2017-10-25 ENCOUNTER — Other Ambulatory Visit: Payer: Self-pay | Admitting: Sports Medicine

## 2017-10-25 DIAGNOSIS — M47812 Spondylosis without myelopathy or radiculopathy, cervical region: Secondary | ICD-10-CM | POA: Diagnosis not present

## 2017-10-25 DIAGNOSIS — M542 Cervicalgia: Secondary | ICD-10-CM

## 2017-10-25 DIAGNOSIS — M7582 Other shoulder lesions, left shoulder: Secondary | ICD-10-CM | POA: Diagnosis not present

## 2017-10-27 ENCOUNTER — Other Ambulatory Visit: Payer: Medicare Other

## 2017-10-27 ENCOUNTER — Ambulatory Visit
Admission: RE | Admit: 2017-10-27 | Discharge: 2017-10-27 | Disposition: A | Discharge status: Home / Self Care | Payer: Medicare Other | Source: Ambulatory Visit | Attending: Sports Medicine | Admitting: Sports Medicine

## 2017-10-27 DIAGNOSIS — M47812 Spondylosis without myelopathy or radiculopathy, cervical region: Secondary | ICD-10-CM

## 2017-10-27 DIAGNOSIS — M542 Cervicalgia: Secondary | ICD-10-CM

## 2017-10-27 DIAGNOSIS — M4802 Spinal stenosis, cervical region: Secondary | ICD-10-CM | POA: Diagnosis not present

## 2017-11-07 DIAGNOSIS — M542 Cervicalgia: Secondary | ICD-10-CM | POA: Diagnosis not present

## 2017-11-07 DIAGNOSIS — M6281 Muscle weakness (generalized): Secondary | ICD-10-CM | POA: Diagnosis not present

## 2017-11-07 DIAGNOSIS — M2560 Stiffness of unspecified joint, not elsewhere classified: Secondary | ICD-10-CM | POA: Diagnosis not present

## 2017-11-07 DIAGNOSIS — R293 Abnormal posture: Secondary | ICD-10-CM | POA: Diagnosis not present

## 2017-11-15 DIAGNOSIS — M47812 Spondylosis without myelopathy or radiculopathy, cervical region: Secondary | ICD-10-CM | POA: Diagnosis not present

## 2017-12-20 DIAGNOSIS — M542 Cervicalgia: Secondary | ICD-10-CM | POA: Diagnosis not present

## 2017-12-20 DIAGNOSIS — M4802 Spinal stenosis, cervical region: Secondary | ICD-10-CM | POA: Diagnosis not present

## 2017-12-20 DIAGNOSIS — M47812 Spondylosis without myelopathy or radiculopathy, cervical region: Secondary | ICD-10-CM | POA: Diagnosis not present

## 2017-12-20 DIAGNOSIS — I10 Essential (primary) hypertension: Secondary | ICD-10-CM | POA: Diagnosis not present

## 2017-12-20 DIAGNOSIS — Z6826 Body mass index (BMI) 26.0-26.9, adult: Secondary | ICD-10-CM | POA: Diagnosis not present

## 2017-12-27 DIAGNOSIS — M542 Cervicalgia: Secondary | ICD-10-CM | POA: Diagnosis not present

## 2017-12-27 DIAGNOSIS — M47892 Other spondylosis, cervical region: Secondary | ICD-10-CM | POA: Diagnosis not present

## 2018-01-01 DIAGNOSIS — M47892 Other spondylosis, cervical region: Secondary | ICD-10-CM | POA: Diagnosis not present

## 2018-01-01 DIAGNOSIS — M542 Cervicalgia: Secondary | ICD-10-CM | POA: Diagnosis not present

## 2018-01-01 DIAGNOSIS — E1142 Type 2 diabetes mellitus with diabetic polyneuropathy: Secondary | ICD-10-CM | POA: Diagnosis not present

## 2018-01-01 DIAGNOSIS — E782 Mixed hyperlipidemia: Secondary | ICD-10-CM | POA: Diagnosis not present

## 2018-01-02 DIAGNOSIS — M47892 Other spondylosis, cervical region: Secondary | ICD-10-CM | POA: Diagnosis not present

## 2018-01-02 DIAGNOSIS — Z87891 Personal history of nicotine dependence: Secondary | ICD-10-CM | POA: Diagnosis not present

## 2018-01-02 DIAGNOSIS — I10 Essential (primary) hypertension: Secondary | ICD-10-CM | POA: Diagnosis not present

## 2018-01-02 DIAGNOSIS — I442 Atrioventricular block, complete: Secondary | ICD-10-CM | POA: Diagnosis not present

## 2018-01-02 DIAGNOSIS — Z7984 Long term (current) use of oral hypoglycemic drugs: Secondary | ICD-10-CM | POA: Diagnosis not present

## 2018-01-02 DIAGNOSIS — E1142 Type 2 diabetes mellitus with diabetic polyneuropathy: Secondary | ICD-10-CM | POA: Diagnosis not present

## 2018-01-02 DIAGNOSIS — Z950 Presence of cardiac pacemaker: Secondary | ICD-10-CM | POA: Diagnosis not present

## 2018-01-02 DIAGNOSIS — N529 Male erectile dysfunction, unspecified: Secondary | ICD-10-CM | POA: Diagnosis not present

## 2018-01-02 DIAGNOSIS — E11319 Type 2 diabetes mellitus with unspecified diabetic retinopathy without macular edema: Secondary | ICD-10-CM | POA: Diagnosis not present

## 2018-01-02 DIAGNOSIS — Z79899 Other long term (current) drug therapy: Secondary | ICD-10-CM | POA: Diagnosis not present

## 2018-01-02 DIAGNOSIS — Z7982 Long term (current) use of aspirin: Secondary | ICD-10-CM | POA: Diagnosis not present

## 2018-01-02 DIAGNOSIS — E782 Mixed hyperlipidemia: Secondary | ICD-10-CM | POA: Diagnosis not present

## 2018-01-02 DIAGNOSIS — M542 Cervicalgia: Secondary | ICD-10-CM | POA: Diagnosis not present

## 2018-01-09 DIAGNOSIS — M542 Cervicalgia: Secondary | ICD-10-CM | POA: Diagnosis not present

## 2018-01-09 DIAGNOSIS — M47892 Other spondylosis, cervical region: Secondary | ICD-10-CM | POA: Diagnosis not present

## 2018-01-11 DIAGNOSIS — M47892 Other spondylosis, cervical region: Secondary | ICD-10-CM | POA: Diagnosis not present

## 2018-01-11 DIAGNOSIS — M542 Cervicalgia: Secondary | ICD-10-CM | POA: Diagnosis not present

## 2018-01-18 DIAGNOSIS — M4802 Spinal stenosis, cervical region: Secondary | ICD-10-CM | POA: Diagnosis not present

## 2018-03-20 ENCOUNTER — Encounter: Payer: Self-pay | Admitting: Internal Medicine

## 2018-03-20 ENCOUNTER — Ambulatory Visit (INDEPENDENT_AMBULATORY_CARE_PROVIDER_SITE_OTHER): Payer: Medicare Other | Admitting: Internal Medicine

## 2018-03-20 VITALS — BP 120/62 | HR 73 | Ht 72.0 in | Wt 199.8 lb

## 2018-03-20 DIAGNOSIS — I10 Essential (primary) hypertension: Secondary | ICD-10-CM

## 2018-03-20 DIAGNOSIS — I442 Atrioventricular block, complete: Secondary | ICD-10-CM | POA: Diagnosis not present

## 2018-03-20 DIAGNOSIS — Z950 Presence of cardiac pacemaker: Secondary | ICD-10-CM | POA: Diagnosis not present

## 2018-03-20 NOTE — Progress Notes (Signed)
      Patient Care Team: Altheimer, Legrand Como, MD as PCP - General (Endocrinology)   HPI  Nathan [name] is a 77 y.o. male seen in followup for pacemaker implantation for intermittent complete hear block undertaken in January 2010.   He has a history of labile blood pressure.   The patient denies chest pain, shortness of breath, nocturnal dyspnea, orthopnea or peripheral edema.  There have been no palpitations, lightheadedness or syncope.   3/17 Echo EF 65%  Date Cr K  10/18 0.7 4.1  6/19 0.73 4.7       Past Medical History:  Diagnosis Date  . Atrioventricular block, 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   Qualifier: Diagnosis of  By: [name], MD, [name]    . DM (diabetes mellitus) ([place])   . HTN (hypertension)   . Hyperlipidemia   . Nephrolithiasis   . Pacemaker-Medtronic 05/10/2011   Medtronic device implanted January 2010   . Vertigo     Past Surgical History:  Procedure Laterality Date  . APPENDECTOMY    . PACEMAKER PLACEMENT      Current Outpatient Medications  Medication Sig Dispense Refill  . aspirin 81 MG tablet Take 81 mg by mouth daily.      Marland Kitchen atorvastatin (LIPITOR) 40 MG tablet Take 1 tablet by mouth daily.    . carvedilol (COREG) 12.5 MG tablet Take 1 tablet (12.5 mg) by mouth in the morning for a blood pressure of 110-140 30 tablet 6  . carvedilol (COREG) 25 MG tablet Take 1 tablet (25 mg) by mouth twice daily as directed    . celecoxib (CELEBREX) 200 MG capsule Take 200 mg by mouth 2 (two) times daily.    Marland Kitchen lisinopril (PRINIVIL,ZESTRIL) 5 MG tablet Take 1 tablet by mouth daily.    . metFORMIN (GLUCOPHAGE) 1000 MG tablet Take 1,000 mg by mouth 2 (two) times daily with a meal.      . nateglinide (STARLIX) 120 MG tablet Take 120 mg by mouth 2 (two) times daily as needed.    Marland Kitchen ZETIA 10 MG tablet Take 10 mg by mouth daily.      No current facility-administered medications for this visit.     Allergies  Allergen Reactions  . Niacin     REACTION:  flushing  . Sulfonamide Derivatives Other (See Comments)    Unknown reaction to medication    Review of Systems negative except from HPI and PMH  Physical Exam BP 120/62   Pulse 73   Ht 6' (1.829 m)   Wt 199 lb 12.8 oz (90.6 kg)   SpO2 98%   BMI 27.10 kg/m  Well developed and nourished in no acute distress HENT normal Neck supple with JVP-flat Clear Regular rate and rhythm, no murmurs or gallops Abd-soft with active BS No Clubbing cyanosis edema Skin-warm and dry  A & Oriented  Grossly normal sensory and motor function    ECG  P-synchronous/ AV  pacing   Assessment and  Plan  Complete heart block now persisting  Pacemaker-[employer]tronic The patient's device was interrogated.  The information was reviewed. No changes were made in the programming.     Hypertension  Labile     Device function normal  BP better

## 2018-03-20 NOTE — Patient Instructions (Signed)
Medication Instructions:  Your physician recommends that you continue on your current medications as directed. Please refer to the Current Medication list given to you today.  Labwork: None ordered.  Testing/Procedures: None ordered.  Follow-Up: Your physician wants you to follow-up in: One Year with Dr Caryl Comes. You will receive a reminder letter in the mail two months in advance. If you don't receive a letter, please call our office to schedule the follow-up appointment.  Please make an appointment with the device clinic in 6 months.     Any Other Special Instructions Will Be Listed Below (If Applicable).     If you need a refill on your cardiac medications before your next appointment, please call your pharmacy.

## 2018-03-21 LAB — CUP PACEART INCLINIC DEVICE CHECK
Battery Impedance: 956 Ohm
Battery Remaining Longevity: 56 mo
Brady Statistic AP VP Percent: 9 %
Brady Statistic AS VP Percent: 91 %
Date Time Interrogation Session: 20190820182358
Implantable Lead Implant Date: 20100125
Implantable Lead Location: 753859
Implantable Lead Model: 5076
Implantable Lead Model: 5076
Implantable Pulse Generator Implant Date: 20100125
Lead Channel Impedance Value: 404 Ohm
Lead Channel Pacing Threshold Amplitude: 0.375 V
Lead Channel Pacing Threshold Amplitude: 0.5 V
Lead Channel Pacing Threshold Pulse Width: 0.4 ms
Lead Channel Pacing Threshold Pulse Width: 0.4 ms
Lead Channel Sensing Intrinsic Amplitude: 2 mV
Lead Channel Setting Pacing Pulse Width: 0.4 ms
Lead Channel Setting Sensing Sensitivity: 5.6 mV
MDC IDC LEAD IMPLANT DT: 20100125
MDC IDC LEAD LOCATION: 753860
MDC IDC MSMT BATTERY VOLTAGE: 2.77 V
MDC IDC MSMT LEADCHNL RA IMPEDANCE VALUE: 549 Ohm
MDC IDC MSMT LEADCHNL RA PACING THRESHOLD PULSEWIDTH: 0.4 ms
MDC IDC MSMT LEADCHNL RV PACING THRESHOLD AMPLITUDE: 0.625 V
MDC IDC MSMT LEADCHNL RV PACING THRESHOLD AMPLITUDE: 0.75 V
MDC IDC MSMT LEADCHNL RV PACING THRESHOLD PULSEWIDTH: 0.4 ms
MDC IDC SET LEADCHNL RA PACING AMPLITUDE: 2 V
MDC IDC SET LEADCHNL RV PACING AMPLITUDE: 2.5 V
MDC IDC STAT BRADY AP VS PERCENT: 0 %
MDC IDC STAT BRADY AS VS PERCENT: 0 %

## 2018-03-25 ENCOUNTER — Other Ambulatory Visit: Payer: Self-pay | Admitting: Internal Medicine

## 2018-03-29 DIAGNOSIS — Z125 Encounter for screening for malignant neoplasm of prostate: Secondary | ICD-10-CM | POA: Diagnosis not present

## 2018-03-29 DIAGNOSIS — E782 Mixed hyperlipidemia: Secondary | ICD-10-CM | POA: Diagnosis not present

## 2018-03-29 DIAGNOSIS — E1142 Type 2 diabetes mellitus with diabetic polyneuropathy: Secondary | ICD-10-CM | POA: Diagnosis not present

## 2018-04-10 DIAGNOSIS — E11319 Type 2 diabetes mellitus with unspecified diabetic retinopathy without macular edema: Secondary | ICD-10-CM | POA: Diagnosis not present

## 2018-04-10 DIAGNOSIS — E1142 Type 2 diabetes mellitus with diabetic polyneuropathy: Secondary | ICD-10-CM | POA: Diagnosis not present

## 2018-04-10 DIAGNOSIS — R1314 Dysphagia, pharyngoesophageal phase: Secondary | ICD-10-CM | POA: Diagnosis not present

## 2018-04-10 DIAGNOSIS — Z7984 Long term (current) use of oral hypoglycemic drugs: Secondary | ICD-10-CM | POA: Diagnosis not present

## 2018-04-10 DIAGNOSIS — I442 Atrioventricular block, complete: Secondary | ICD-10-CM | POA: Diagnosis not present

## 2018-04-10 DIAGNOSIS — I10 Essential (primary) hypertension: Secondary | ICD-10-CM | POA: Diagnosis not present

## 2018-04-10 DIAGNOSIS — E782 Mixed hyperlipidemia: Secondary | ICD-10-CM | POA: Diagnosis not present

## 2018-04-10 DIAGNOSIS — Z125 Encounter for screening for malignant neoplasm of prostate: Secondary | ICD-10-CM | POA: Diagnosis not present

## 2018-04-10 DIAGNOSIS — Z950 Presence of cardiac pacemaker: Secondary | ICD-10-CM | POA: Diagnosis not present

## 2018-04-11 ENCOUNTER — Other Ambulatory Visit (HOSPITAL_COMMUNITY): Payer: Self-pay | Admitting: Otolaryngology

## 2018-04-11 DIAGNOSIS — R1313 Dysphagia, pharyngeal phase: Secondary | ICD-10-CM

## 2018-04-17 ENCOUNTER — Ambulatory Visit (HOSPITAL_COMMUNITY)
Admission: RE | Admit: 2018-04-17 | Discharge: 2018-04-17 | Disposition: A | Discharge status: Home / Self Care | Payer: Medicare Other | Source: Ambulatory Visit | Attending: Otolaryngology | Admitting: Otolaryngology

## 2018-04-17 DIAGNOSIS — K219 Gastro-esophageal reflux disease without esophagitis: Secondary | ICD-10-CM | POA: Insufficient documentation

## 2018-04-17 DIAGNOSIS — K225 Diverticulum of esophagus, acquired: Secondary | ICD-10-CM | POA: Diagnosis not present

## 2018-04-17 DIAGNOSIS — K224 Dyskinesia of esophagus: Secondary | ICD-10-CM | POA: Insufficient documentation

## 2018-04-17 DIAGNOSIS — K449 Diaphragmatic hernia without obstruction or gangrene: Secondary | ICD-10-CM | POA: Diagnosis not present

## 2018-04-17 DIAGNOSIS — R1314 Dysphagia, pharyngoesophageal phase: Secondary | ICD-10-CM | POA: Insufficient documentation

## 2018-04-17 DIAGNOSIS — T17308A Unspecified foreign body in larynx causing other injury, initial encounter: Secondary | ICD-10-CM | POA: Diagnosis not present

## 2018-04-17 DIAGNOSIS — R1313 Dysphagia, pharyngeal phase: Secondary | ICD-10-CM | POA: Diagnosis present

## 2018-04-18 DIAGNOSIS — M4802 Spinal stenosis, cervical region: Secondary | ICD-10-CM | POA: Diagnosis not present

## 2018-04-18 DIAGNOSIS — I10 Essential (primary) hypertension: Secondary | ICD-10-CM | POA: Diagnosis not present

## 2018-04-18 DIAGNOSIS — Z6827 Body mass index (BMI) 27.0-27.9, adult: Secondary | ICD-10-CM | POA: Diagnosis not present

## 2018-04-18 DIAGNOSIS — M542 Cervicalgia: Secondary | ICD-10-CM | POA: Diagnosis not present

## 2018-04-29 IMAGING — CT CT HEAD W/O CM
3 of 4 series · 15 of 47 positions shown, 18 images · non-contrast
Comparison: None.

CLINICAL DATA: Dizziness

EXAM:
CT HEAD WITHOUT CONTRAST
TECHNIQUE: Contiguous axial images were obtained from the base of the skull
through the vertex without intravenous contrast.

[Series 2: head w/o · axial · non-contrast · 0.48mm/px · z∈[-124,+11]mm · 9 of 33 slices shown, 12 images]
[im 3/33  brain]
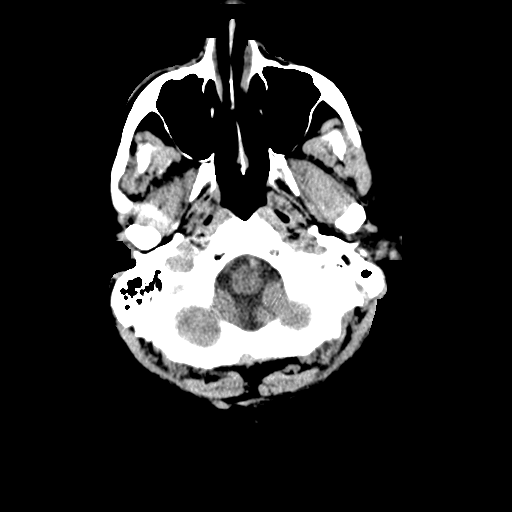
[im 3/33  bone]
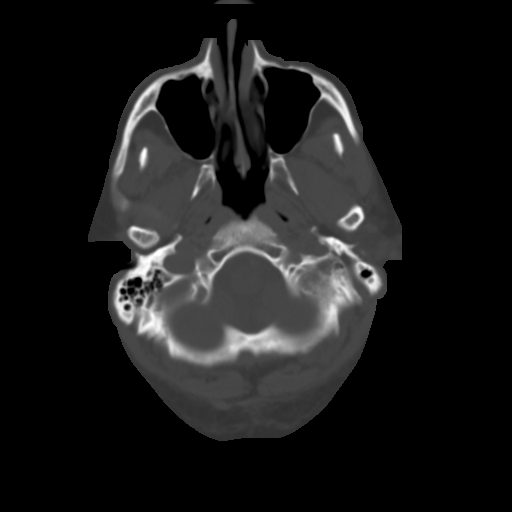
[im 7/33  brain]
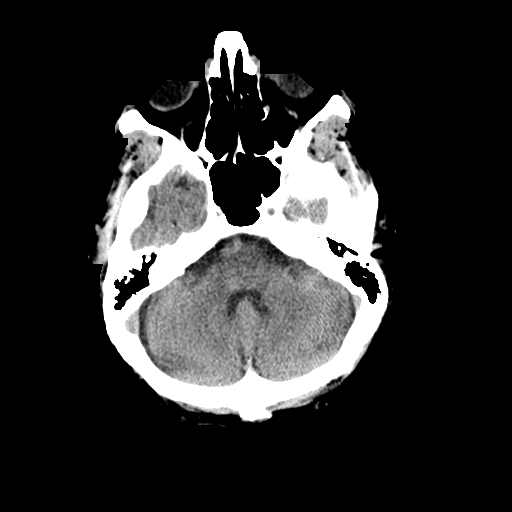
[im 10/33  brain]
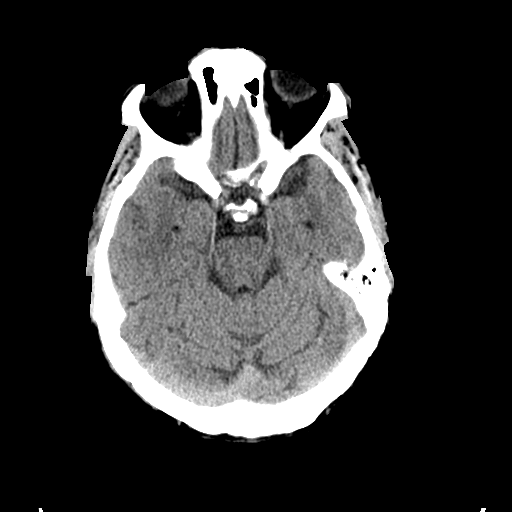
[im 14/33  brain]
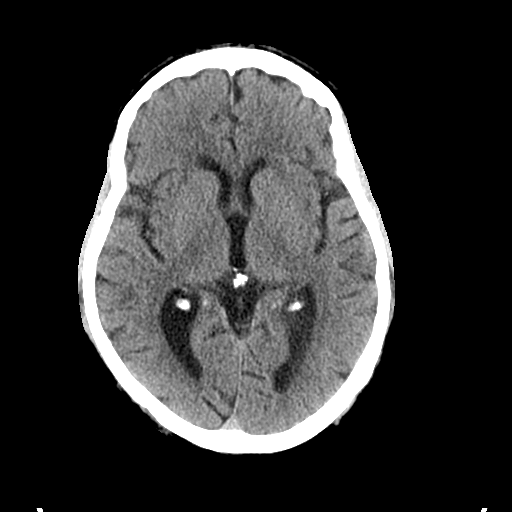
[im 17/33  brain]
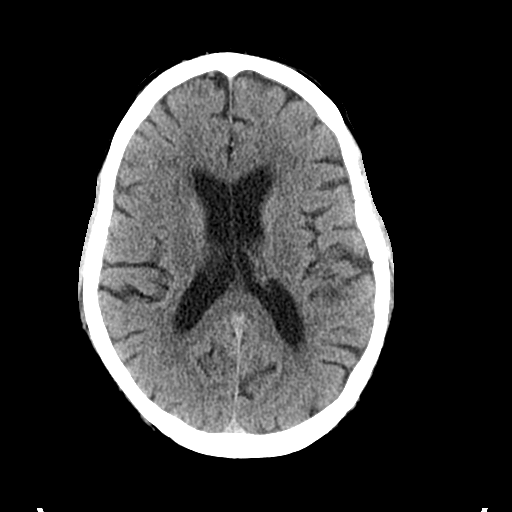
[im 17/33  bone]
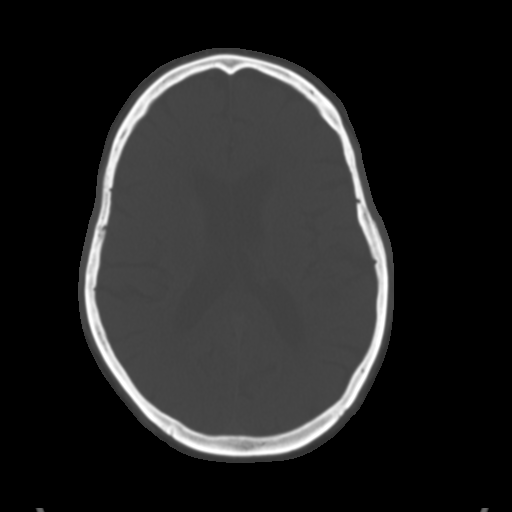
[im 19/33  brain]
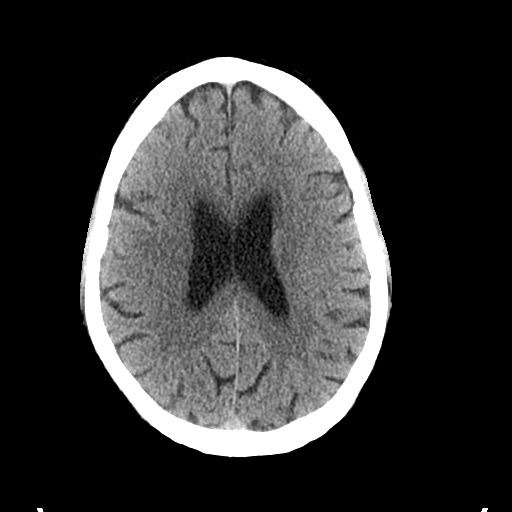
[im 23/33  brain]
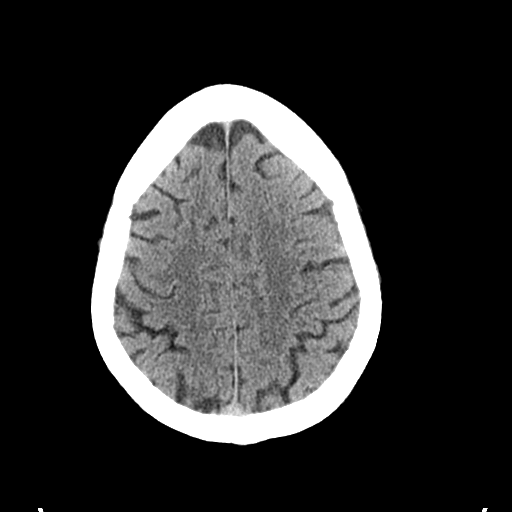
[im 26/33  brain]
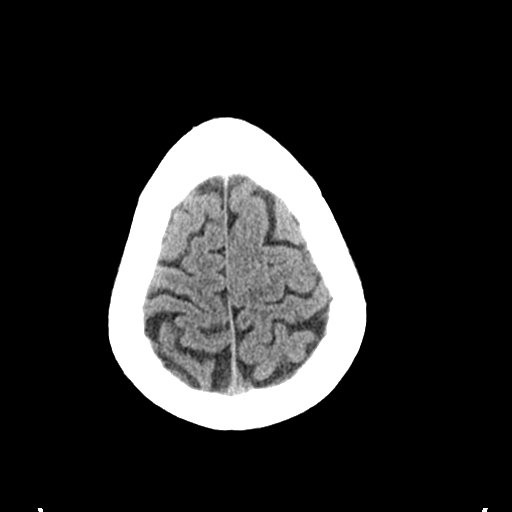
[im 30/33  brain]
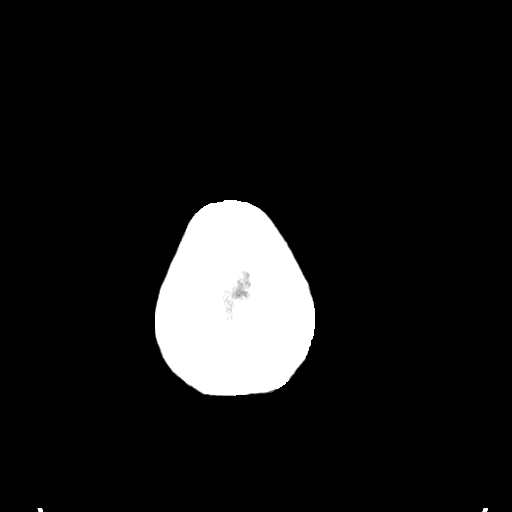
[im 30/33  bone]
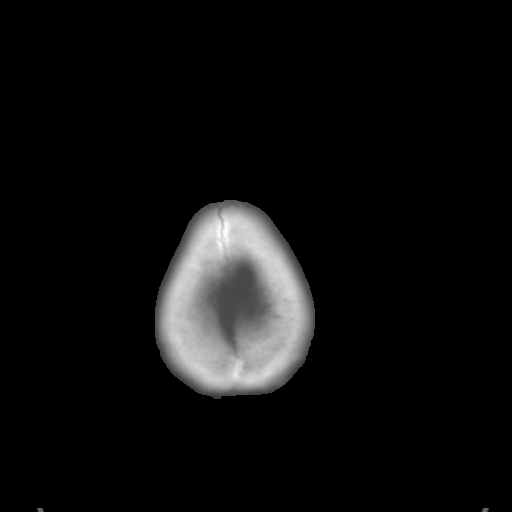

[Series 4: coronal · coronal · 0.38mm/px · 3 of 67 slices shown]
[im 23/67  brain]
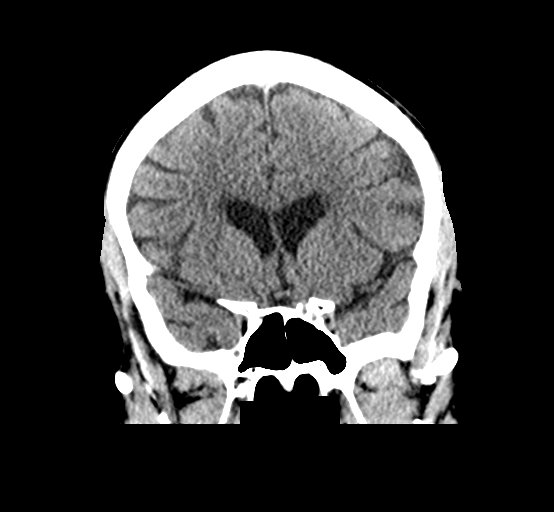
[im 30/67  brain]
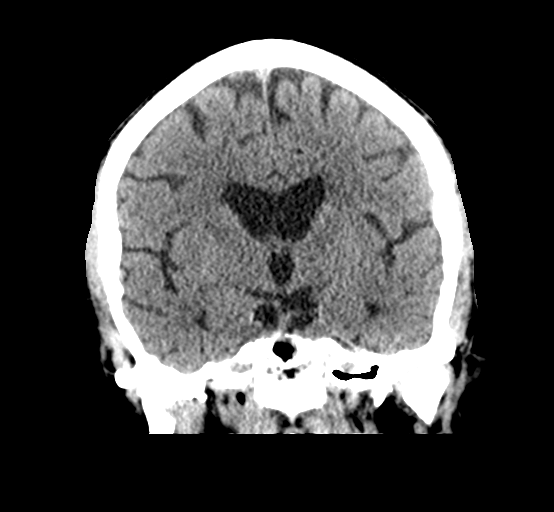
[im 37/67  brain]
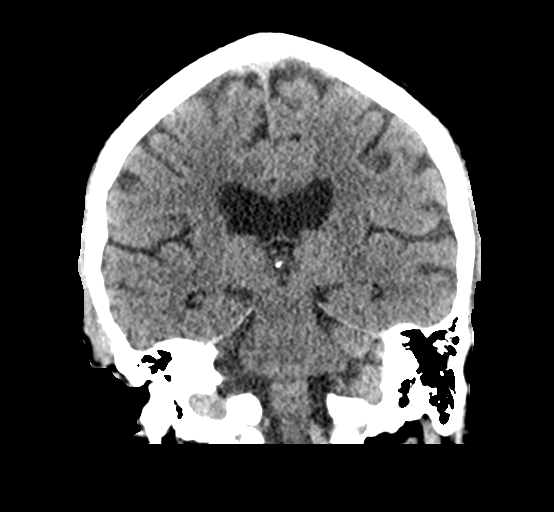

[Series 5: sagittal · sagittal · 0.31mm/px · 3 of 54 slices shown]
[im 18/54  brain]
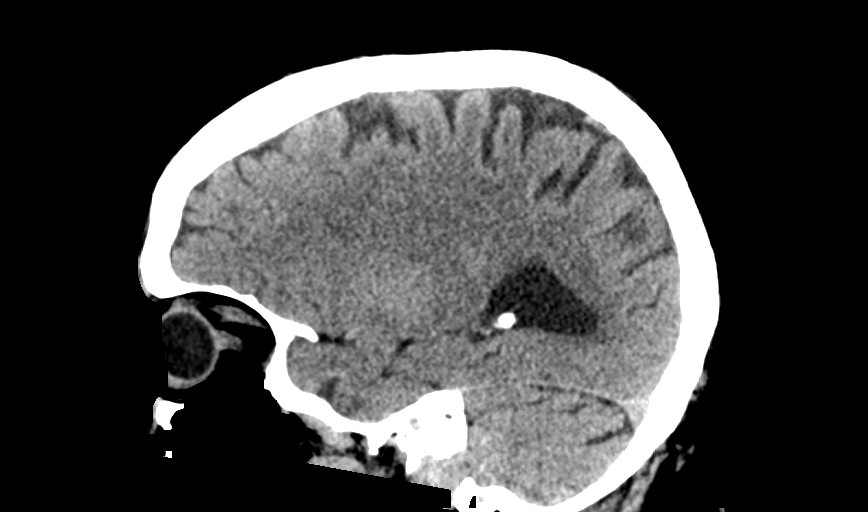
[im 27/54  brain]
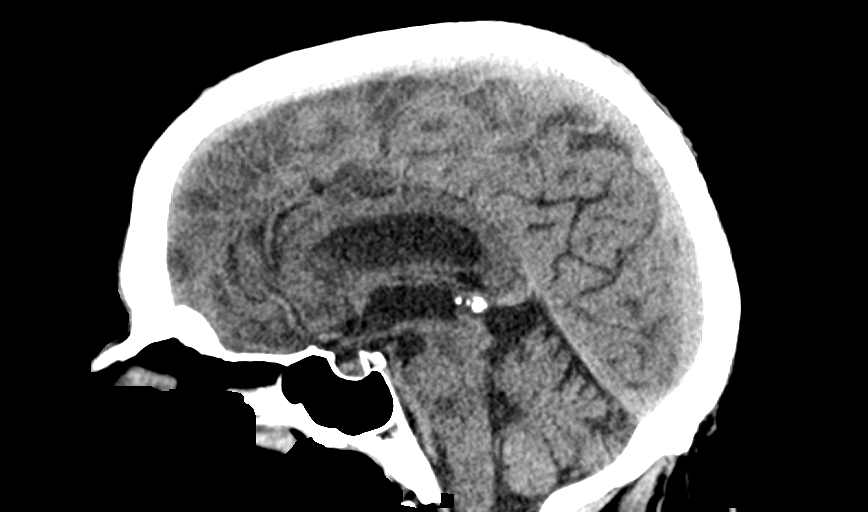
[im 36/54  brain]
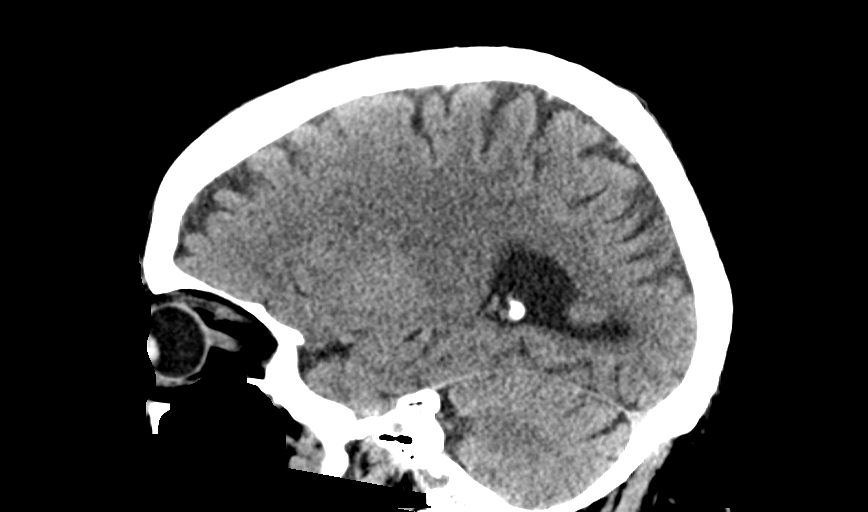

[15 of 47 positions shown; findings below may reference images not displayed]

FINDINGS: Brain: No evidence of acute infarction, hemorrhage, hydrocephalus,
extra-axial collection or mass lesion/mass effect. Mild atrophy and
minimal small vessel ischemic changes of the white matter

Vascular: No hyperdense vessels.  Carotid artery calcification.

Skull: Normal. Negative for fracture or focal lesion.

Sinuses/Orbits: No acute finding.

Other: None
IMPRESSION: No CT evidence for acute intracranial abnormality.

## 2018-05-22 DIAGNOSIS — M47812 Spondylosis without myelopathy or radiculopathy, cervical region: Secondary | ICD-10-CM | POA: Diagnosis not present

## 2018-06-26 DIAGNOSIS — M542 Cervicalgia: Secondary | ICD-10-CM | POA: Diagnosis not present

## 2018-06-26 DIAGNOSIS — M47812 Spondylosis without myelopathy or radiculopathy, cervical region: Secondary | ICD-10-CM | POA: Diagnosis not present

## 2018-06-26 DIAGNOSIS — I10 Essential (primary) hypertension: Secondary | ICD-10-CM | POA: Diagnosis not present

## 2018-06-26 DIAGNOSIS — Z6826 Body mass index (BMI) 26.0-26.9, adult: Secondary | ICD-10-CM | POA: Diagnosis not present

## 2018-07-06 DIAGNOSIS — E1142 Type 2 diabetes mellitus with diabetic polyneuropathy: Secondary | ICD-10-CM | POA: Diagnosis not present

## 2018-07-06 DIAGNOSIS — E782 Mixed hyperlipidemia: Secondary | ICD-10-CM | POA: Diagnosis not present

## 2018-07-06 DIAGNOSIS — Z79899 Other long term (current) drug therapy: Secondary | ICD-10-CM | POA: Diagnosis not present

## 2018-07-12 ENCOUNTER — Ambulatory Visit (INDEPENDENT_AMBULATORY_CARE_PROVIDER_SITE_OTHER): Payer: Medicare Other | Admitting: *Deleted

## 2018-07-12 DIAGNOSIS — I442 Atrioventricular block, complete: Secondary | ICD-10-CM

## 2018-07-12 DIAGNOSIS — E538 Deficiency of other specified B group vitamins: Secondary | ICD-10-CM | POA: Diagnosis not present

## 2018-07-12 DIAGNOSIS — E782 Mixed hyperlipidemia: Secondary | ICD-10-CM | POA: Diagnosis not present

## 2018-07-12 DIAGNOSIS — Z950 Presence of cardiac pacemaker: Secondary | ICD-10-CM | POA: Diagnosis not present

## 2018-07-12 DIAGNOSIS — E1142 Type 2 diabetes mellitus with diabetic polyneuropathy: Secondary | ICD-10-CM | POA: Diagnosis not present

## 2018-07-12 DIAGNOSIS — Z79899 Other long term (current) drug therapy: Secondary | ICD-10-CM | POA: Diagnosis not present

## 2018-07-12 DIAGNOSIS — Z125 Encounter for screening for malignant neoplasm of prostate: Secondary | ICD-10-CM | POA: Diagnosis not present

## 2018-07-12 DIAGNOSIS — I10 Essential (primary) hypertension: Secondary | ICD-10-CM | POA: Diagnosis not present

## 2018-07-13 LAB — CUP PACEART INCLINIC DEVICE CHECK
Battery Remaining Longevity: 51 mo
Battery Voltage: 2.77 V
Brady Statistic AS VP Percent: 89 %
Date Time Interrogation Session: 20191212192949
Implantable Lead Implant Date: 20100125
Implantable Lead Location: 753859
Implantable Lead Model: 5076
Lead Channel Pacing Threshold Amplitude: 0.375 V
Lead Channel Pacing Threshold Amplitude: 0.625 V
Lead Channel Pacing Threshold Pulse Width: 0.4 ms
Lead Channel Setting Pacing Amplitude: 2 V
Lead Channel Setting Pacing Pulse Width: 0.4 ms
Lead Channel Setting Sensing Sensitivity: 5.6 mV
MDC IDC LEAD IMPLANT DT: 20100125
MDC IDC LEAD LOCATION: 753860
MDC IDC MSMT BATTERY IMPEDANCE: 1087 Ohm
MDC IDC MSMT LEADCHNL RA IMPEDANCE VALUE: 540 Ohm
MDC IDC MSMT LEADCHNL RA PACING THRESHOLD PULSEWIDTH: 0.4 ms
MDC IDC MSMT LEADCHNL RV IMPEDANCE VALUE: 406 Ohm
MDC IDC PG IMPLANT DT: 20100125
MDC IDC SET LEADCHNL RV PACING AMPLITUDE: 2.5 V
MDC IDC STAT BRADY AP VP PERCENT: 11 %
MDC IDC STAT BRADY AP VS PERCENT: 0 %
MDC IDC STAT BRADY AS VS PERCENT: 0 %

## 2018-07-13 NOTE — Progress Notes (Signed)
Pt seen d/t c/o of irr. HB for 3 days. No AHR noted, 1 AMS<30sec, No VHR noted, Informed pt that he could be feeling PVCs, informed pt that if he has this feeling of irr. HB for days to call office back and schedule apt with APP.

## 2018-08-09 DIAGNOSIS — E119 Type 2 diabetes mellitus without complications: Secondary | ICD-10-CM | POA: Diagnosis not present

## 2018-08-09 DIAGNOSIS — H5203 Hypermetropia, bilateral: Secondary | ICD-10-CM | POA: Diagnosis not present

## 2018-08-09 DIAGNOSIS — H524 Presbyopia: Secondary | ICD-10-CM | POA: Diagnosis not present

## 2018-08-09 DIAGNOSIS — H52203 Unspecified astigmatism, bilateral: Secondary | ICD-10-CM | POA: Diagnosis not present

## 2018-08-13 ENCOUNTER — Other Ambulatory Visit: Payer: Self-pay | Admitting: Internal Medicine

## 2018-08-13 MED ORDER — CARVEDILOL 25 MG PO TABS: 25.0000 mg | ORAL_TABLET | Freq: Two times a day (BID) | ORAL | 2 refills | Status: DC

## 2018-08-13 NOTE — Telephone Encounter (Signed)
Pt's medication was sent to pt's pharmacy as requested. Confirmation received.  °

## 2018-08-14 ENCOUNTER — Other Ambulatory Visit: Payer: Self-pay | Admitting: Internal Medicine

## 2018-08-14 NOTE — Telephone Encounter (Signed)
Pt calling requesting a refill on carvedilol 6.25 mg tablet. This medication strength is not on pt's medication list. Carvedilol 25 mg tablet and Carvedilol 12.5 mg tablet is on pt's medication list. Pt stated that he has never had the Carvedilol 25 mg tablet. Pt stated that has been taking Carvedilol 6.25 mg tablet in the evening daily and his blood pressure has been stabled. Pt wanted to know if Dr. Caryl Comes wanted him to continue the 6.25 mg tablet or does Dr. Caryl Comes want pt to take it differently. Please address

## 2018-08-14 NOTE — Telephone Encounter (Signed)
Why do not we have him stop his carvedilol.  Thanks

## 2018-08-14 NOTE — Telephone Encounter (Signed)
Pt called to report that he had requested a refill on his Coreg..   but he has been taking 6.25mg  po every night.Marland Kitchen and in the morning he checks his BP first and if above 140 he will take another 6.25mg  and if below 110 he holds it.  His med list reflects completely different...  Coreg 25mg  bid and 12.5 in the morning if BP above 140.   Pt reports BP has been running 120/60 up until 1 1/2 weeks ago when he started taking Magnesium 400mg  a day on his own for leg cramps..  Now on the [place]... his BP has been running lower 100/ 50-60,  His leg cramps are gone, and he no longer has palpitations...  Pt says otherwise he has felt well on the Coreg as he has been taking it.   Will forward to Dr. Caryl Comes for his advice and recommendation re: the pt continuing to take his Mag Supplements and his Coreg dose going forward.

## 2018-08-15 NOTE — Telephone Encounter (Signed)
Spoke with patient and advised him to stop Carvedilol per Dr. Caryl Comes. Patient verbalized understanding and agrees to monitor BP and HR and to call back with questions or concerns. He thanked me for the call.

## 2018-08-15 NOTE — Telephone Encounter (Signed)
° ° °  Please call patient with recommendation from Dr Caryl Comes.

## 2018-08-21 DIAGNOSIS — M79662 Pain in left lower leg: Secondary | ICD-10-CM | POA: Diagnosis not present

## 2018-08-22 DIAGNOSIS — E119 Type 2 diabetes mellitus without complications: Secondary | ICD-10-CM | POA: Diagnosis not present

## 2018-10-09 DIAGNOSIS — Z79899 Other long term (current) drug therapy: Secondary | ICD-10-CM | POA: Diagnosis not present

## 2018-10-09 DIAGNOSIS — E1142 Type 2 diabetes mellitus with diabetic polyneuropathy: Secondary | ICD-10-CM | POA: Diagnosis not present

## 2018-10-09 DIAGNOSIS — E782 Mixed hyperlipidemia: Secondary | ICD-10-CM | POA: Diagnosis not present

## 2018-10-11 DIAGNOSIS — E1142 Type 2 diabetes mellitus with diabetic polyneuropathy: Secondary | ICD-10-CM | POA: Diagnosis not present

## 2018-10-11 DIAGNOSIS — E782 Mixed hyperlipidemia: Secondary | ICD-10-CM | POA: Diagnosis not present

## 2018-10-11 DIAGNOSIS — Z950 Presence of cardiac pacemaker: Secondary | ICD-10-CM | POA: Diagnosis not present

## 2018-10-11 DIAGNOSIS — Z125 Encounter for screening for malignant neoplasm of prostate: Secondary | ICD-10-CM | POA: Diagnosis not present

## 2018-10-11 DIAGNOSIS — Z7984 Long term (current) use of oral hypoglycemic drugs: Secondary | ICD-10-CM | POA: Diagnosis not present

## 2018-10-11 DIAGNOSIS — E538 Deficiency of other specified B group vitamins: Secondary | ICD-10-CM | POA: Diagnosis not present

## 2018-10-11 DIAGNOSIS — I10 Essential (primary) hypertension: Secondary | ICD-10-CM | POA: Diagnosis not present

## 2018-10-11 DIAGNOSIS — I442 Atrioventricular block, complete: Secondary | ICD-10-CM | POA: Diagnosis not present

## 2019-01-01 DIAGNOSIS — M47812 Spondylosis without myelopathy or radiculopathy, cervical region: Secondary | ICD-10-CM | POA: Diagnosis not present

## 2019-01-01 DIAGNOSIS — M542 Cervicalgia: Secondary | ICD-10-CM | POA: Diagnosis not present

## 2019-01-04 DIAGNOSIS — E1142 Type 2 diabetes mellitus with diabetic polyneuropathy: Secondary | ICD-10-CM | POA: Diagnosis not present

## 2019-01-04 DIAGNOSIS — E782 Mixed hyperlipidemia: Secondary | ICD-10-CM | POA: Diagnosis not present

## 2019-01-11 DIAGNOSIS — Z950 Presence of cardiac pacemaker: Secondary | ICD-10-CM | POA: Diagnosis not present

## 2019-01-11 DIAGNOSIS — E538 Deficiency of other specified B group vitamins: Secondary | ICD-10-CM | POA: Diagnosis not present

## 2019-01-11 DIAGNOSIS — Z125 Encounter for screening for malignant neoplasm of prostate: Secondary | ICD-10-CM | POA: Diagnosis not present

## 2019-01-11 DIAGNOSIS — Z79899 Other long term (current) drug therapy: Secondary | ICD-10-CM | POA: Diagnosis not present

## 2019-01-11 DIAGNOSIS — E782 Mixed hyperlipidemia: Secondary | ICD-10-CM | POA: Diagnosis not present

## 2019-01-11 DIAGNOSIS — I10 Essential (primary) hypertension: Secondary | ICD-10-CM | POA: Diagnosis not present

## 2019-01-11 DIAGNOSIS — E1142 Type 2 diabetes mellitus with diabetic polyneuropathy: Secondary | ICD-10-CM | POA: Diagnosis not present

## 2019-01-11 DIAGNOSIS — I442 Atrioventricular block, complete: Secondary | ICD-10-CM | POA: Diagnosis not present

## 2019-01-29 DIAGNOSIS — M47812 Spondylosis without myelopathy or radiculopathy, cervical region: Secondary | ICD-10-CM | POA: Diagnosis not present

## 2019-02-04 DIAGNOSIS — R1314 Dysphagia, pharyngoesophageal phase: Secondary | ICD-10-CM | POA: Diagnosis not present

## 2019-03-06 ENCOUNTER — Other Ambulatory Visit: Payer: Self-pay | Admitting: Internal Medicine

## 2019-03-28 ENCOUNTER — Telehealth (INDEPENDENT_AMBULATORY_CARE_PROVIDER_SITE_OTHER): Payer: Medicare Other | Admitting: Internal Medicine

## 2019-03-28 ENCOUNTER — Encounter: Payer: Self-pay | Admitting: Internal Medicine

## 2019-03-28 VITALS — BP 111/64 | HR 78 | Wt 186.0 lb

## 2019-03-28 DIAGNOSIS — I739 Peripheral vascular disease, unspecified: Secondary | ICD-10-CM | POA: Diagnosis not present

## 2019-03-28 NOTE — Progress Notes (Signed)
Electrophysiology TeleHealth Note   Due to national recommendations of social distancing due to COVID 19, an audio/video telehealth visit is felt to be most appropriate for this patient at this time.  See MyChart message from today for the patient's consent to telehealth for [facility]   Date:  03/28/2019   ID:  [name], DOB 02-Feb-1941, MRN [id number]  Location: patient's home  Provider location: [address], [place]  Evaluation Performed: Follow-up visit  PCP:  Altheimer, Legrand Como, MD  Cardiologist:     Electrophysiologist:  SK   Chief Complaint:  Complete heart block   History of Present Illness:    Nathan [name] is a 78 y.o. male who presents via audio/video conferencing for a telehealth visit today.  Since last being seen in our clinic for complete heart block and pacemaker, the patient reports *doing very well  The patient denies chest pain, shortness of breath, nocturnal dyspnea, orthopnea or peripheral edema.  There have been no palpitations, lightheadedness or syncope.  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   Past Medical History:  Diagnosis Date  . Atrioventricular block, 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   Qualifier: Diagnosis of  By: [name], MD, [name]    . DM (diabetes mellitus) ([place])   . HTN (hypertension)   . Hyperlipidemia   . Nephrolithiasis   . Pacemaker-Medtronic 05/10/2011   Medtronic device implanted January 2010   . Vertigo     Past Surgical History:  Procedure Laterality Date  . APPENDECTOMY    . PACEMAKER PLACEMENT      Current Outpatient  Medications  Medication Sig Dispense Refill  . aspirin 81 MG tablet Take 81 mg by mouth daily.      Marland Kitchen atorvastatin (LIPITOR) 40 MG tablet Take 1 tablet by mouth daily.    . metFORMIN (GLUCOPHAGE) 1000 MG tablet Take 1,000 mg by mouth 2 (two) times daily with a meal.      . nateglinide (STARLIX) 120 MG tablet Take 120 mg by mouth 2 (two) times daily as needed.    Marland Kitchen ZETIA 10 MG tablet Take 10 mg by mouth daily.     Marland Kitchen lisinopril (ZESTRIL) 5 MG tablet TAKE 1 TABLET BY MOUTH DAILY (Patient not taking: Reported on 03/28/2019) 90 tablet 0   No current facility-administered medications for this visit.     Allergies:   Niacin and Sulfonamide derivatives   Social History:  The patient  reports that he has never smoked. He has never used smokeless tobacco. He reports current alcohol use. He reports that he does not use drugs.   Family History:  The patient's   family history includes Cancer - Colon in his father.   ROS:  Please see the history of present illness.   All other systems are personally reviewed and negative.    Exam:    Vital Signs:  BP 111/64 (BP Location: Right Arm, Patient Position: Sitting, Cuff Size: Normal)   Pulse 78   Wt 186 lb (84.4 kg)   BMI 25.23 kg/m     Well appearing, alert and conversant, regular work of breathing,  good skin color Eyes- anicteric, neuro- grossly intact, skin- no apparent rash or lesions or cyanosis, mouth- oral mucosa is pink   Labs/Other Tests and Data Reviewed:    Recent Labs: No results found for requested labs within last 8760 hours.   Wt Readings from Last 3 Encounters:  03/28/19 186 lb (84.4 kg)  03/20/18 199 lb 12.8 oz (90.6 kg)  05/18/17 188 lb (85.3 kg)     Other studies personally reviewed:    Last device interrogation is reviewed from [place] PDF dated 112/19 in office  which reveals normal device function,   arrhythmias - none   ASSESSMENT & PLAN:    Complete heart block now persisting  Pacemaker-Medtronic     Hypertension   Bilateral thigh claudication about 3 years ago     BP stablized occ prn carvedilol  Will try to reestablish remote monitoring  Spoke with Dr Hiram Comber  ? Bilateral thigh claudication Recommended we do ABIs   COVID 19 screen The patient denies symptoms of COVID 19 at this time.  The importance of social distancing was discussed today.  Follow-up: 6 m Next remote: will try  Current medicines are reviewed at length with the patient today.   The patient does not have concerns regarding his medicines.  The following changes were made today:  none  Labs/ tests ordered today include: Lower extremity ABIs No orders of the defined types were placed in this encounter.   Future tests ( post COVID )     Patient Risk:  after full review of this patients clinical status, I feel that they are at moderate risk at this time.  Today, I have spent 21* minutes with the patient with telehealth technology discussing the above.  Signed, Virl Axe, MD  03/28/2019 3:59 PM     [address] [place] [address] [phone] (office) [phone] (fax)

## 2019-03-28 NOTE — Patient Instructions (Signed)
Medication Instructions:  Your physician recommends that you continue on your current medications as directed. Please refer to the Current Medication list given to you today.  If you need a refill on your cardiac medications before your next appointment, please call your pharmacy.   Lab work: None ordered If you have labs (blood work) drawn today and your tests are completely normal, you will receive your results only by: Marland Kitchen MyChart Message (if you have MyChart) OR . A paper copy in the mail If you have any lab test that is abnormal or we need to change your treatment, we will call you to review the results.  Testing/Procedures: SOMEONE WILL CALL YOU TO SCHEDULE; Your physician has requested that you have a lower extremity arterial duplex. This test is an ultrasound of the arteries in the legs. It looks at arterial blood flow in the legs. Allow one hour for Lower Arterial scans. There are no restrictions or special instructions.    Follow-Up: At [facility], you and your health needs are our priority.  As part of our continuing mission to provide you with exceptional heart care, we have created designated Provider Care Teams.  These Care Teams include your primary Cardiologist (physician) and Advanced Practice Providers (APPs -  Physician Assistants and Nurse Practitioners) who all work together to provide you with the care you need, when you need it. You will need a follow up appointment in 6 months.  Please call our office 2 months in advance to schedule this appointment.  You may see Dr. Caryl Comes or one of the following Advanced Practice Providers on your designated Care Team:   Chanetta Marshall, NP . Tommye Standard, PA-C  Any Other Special Instructions Will Be Listed Below (If Applicable).

## 2019-03-29 ENCOUNTER — Other Ambulatory Visit: Payer: Self-pay | Admitting: Internal Medicine

## 2019-03-29 DIAGNOSIS — I739 Peripheral vascular disease, unspecified: Secondary | ICD-10-CM

## 2019-04-03 ENCOUNTER — Other Ambulatory Visit: Payer: Self-pay

## 2019-04-03 ENCOUNTER — Ambulatory Visit (HOSPITAL_COMMUNITY)
Admission: RE | Admit: 2019-04-03 | Discharge: 2019-04-03 | Disposition: A | Discharge status: Home / Self Care | Payer: Medicare Other | Source: Ambulatory Visit | Attending: Cardiology | Admitting: Cardiology

## 2019-04-03 DIAGNOSIS — I739 Peripheral vascular disease, unspecified: Secondary | ICD-10-CM | POA: Diagnosis not present

## 2019-04-04 DIAGNOSIS — H2513 Age-related nuclear cataract, bilateral: Secondary | ICD-10-CM | POA: Diagnosis not present

## 2019-04-10 DIAGNOSIS — E782 Mixed hyperlipidemia: Secondary | ICD-10-CM | POA: Diagnosis not present

## 2019-04-10 DIAGNOSIS — E538 Deficiency of other specified B group vitamins: Secondary | ICD-10-CM | POA: Diagnosis not present

## 2019-04-10 DIAGNOSIS — E1142 Type 2 diabetes mellitus with diabetic polyneuropathy: Secondary | ICD-10-CM | POA: Diagnosis not present

## 2019-04-15 DIAGNOSIS — I10 Essential (primary) hypertension: Secondary | ICD-10-CM | POA: Diagnosis not present

## 2019-04-15 DIAGNOSIS — I442 Atrioventricular block, complete: Secondary | ICD-10-CM | POA: Diagnosis not present

## 2019-04-15 DIAGNOSIS — E538 Deficiency of other specified B group vitamins: Secondary | ICD-10-CM | POA: Diagnosis not present

## 2019-04-15 DIAGNOSIS — Z950 Presence of cardiac pacemaker: Secondary | ICD-10-CM | POA: Diagnosis not present

## 2019-04-15 DIAGNOSIS — E782 Mixed hyperlipidemia: Secondary | ICD-10-CM | POA: Diagnosis not present

## 2019-04-15 DIAGNOSIS — Z125 Encounter for screening for malignant neoplasm of prostate: Secondary | ICD-10-CM | POA: Diagnosis not present

## 2019-04-15 DIAGNOSIS — E1142 Type 2 diabetes mellitus with diabetic polyneuropathy: Secondary | ICD-10-CM | POA: Diagnosis not present

## 2019-04-22 ENCOUNTER — Telehealth: Payer: Self-pay

## 2019-04-22 NOTE — Telephone Encounter (Signed)
Pt states someone called him because he is overdue for a pacer check. I did not see a phone note to see who called him but I did schedule him an appointment to see the device clinic 05/09/2019 at 3 pm.

## 2019-05-07 ENCOUNTER — Encounter: Payer: Medicare Other | Admitting: Internal Medicine

## 2019-05-07 ENCOUNTER — Encounter

## 2019-05-09 ENCOUNTER — Other Ambulatory Visit: Payer: Self-pay

## 2019-05-09 ENCOUNTER — Ambulatory Visit (INDEPENDENT_AMBULATORY_CARE_PROVIDER_SITE_OTHER): Payer: Medicare Other | Admitting: *Deleted

## 2019-05-09 DIAGNOSIS — I442 Atrioventricular block, complete: Secondary | ICD-10-CM

## 2019-05-09 NOTE — Patient Instructions (Signed)
Return for in clinic device check in 6 months.

## 2019-05-23 LAB — CUP PACEART INCLINIC DEVICE CHECK
Battery Impedance: 1430 Ohm
Battery Remaining Longevity: 42 mo
Battery Voltage: 2.76 V
Brady Statistic AP VP Percent: 13 %
Brady Statistic AP VS Percent: 0 %
Brady Statistic AS VP Percent: 87 %
Brady Statistic AS VS Percent: 0 %
Date Time Interrogation Session: 20201008190917
Implantable Lead Implant Date: 20100125
Implantable Lead Implant Date: 20100125
Implantable Lead Location: 753859
Implantable Lead Location: 753860
Implantable Lead Model: 5076
Implantable Lead Model: 5076
Implantable Pulse Generator Implant Date: 20100125
Lead Channel Impedance Value: 406 Ohm
Lead Channel Impedance Value: 550 Ohm
Lead Channel Pacing Threshold Amplitude: 0.75 V
Lead Channel Pacing Threshold Amplitude: 1 V
Lead Channel Pacing Threshold Pulse Width: 0.4 ms
Lead Channel Pacing Threshold Pulse Width: 0.4 ms
Lead Channel Sensing Intrinsic Amplitude: 1.4 mV
Lead Channel Setting Pacing Amplitude: 2 V
Lead Channel Setting Pacing Amplitude: 2.5 V
Lead Channel Setting Pacing Pulse Width: 0.4 ms
Lead Channel Setting Sensing Sensitivity: 5.6 mV

## 2019-05-23 NOTE — Progress Notes (Signed)
  Pacemaker check in clinic. Normal device function. Thresholds, sensing, impedances consistent with previous measurements. Device programmed to maximize longevity. Mode switch < 1 %, 4 AHR that appear to be AT with controlled v-rates with longest episode 59 seconds. No  high ventricular rates noted. Device programmed at appropriate safety margins. Histogram distribution appropriate for patient activity level. Device programmed to optimize intrinsic conduction. Estimated longevity 3 yrs 6 months. Patient declines enrollment in remote monitoring. Next in [place] check in 6 months on  11/07/2019. Patient education completed.

## 2019-06-04 ENCOUNTER — Other Ambulatory Visit: Payer: Self-pay | Admitting: Internal Medicine

## 2019-07-10 DIAGNOSIS — E782 Mixed hyperlipidemia: Secondary | ICD-10-CM | POA: Diagnosis not present

## 2019-07-10 DIAGNOSIS — E1142 Type 2 diabetes mellitus with diabetic polyneuropathy: Secondary | ICD-10-CM | POA: Diagnosis not present

## 2019-07-15 DIAGNOSIS — E538 Deficiency of other specified B group vitamins: Secondary | ICD-10-CM | POA: Diagnosis not present

## 2019-07-15 DIAGNOSIS — E1142 Type 2 diabetes mellitus with diabetic polyneuropathy: Secondary | ICD-10-CM | POA: Diagnosis not present

## 2019-07-15 DIAGNOSIS — Z950 Presence of cardiac pacemaker: Secondary | ICD-10-CM | POA: Diagnosis not present

## 2019-07-15 DIAGNOSIS — Z79899 Other long term (current) drug therapy: Secondary | ICD-10-CM | POA: Diagnosis not present

## 2019-07-15 DIAGNOSIS — I10 Essential (primary) hypertension: Secondary | ICD-10-CM | POA: Diagnosis not present

## 2019-07-15 DIAGNOSIS — I442 Atrioventricular block, complete: Secondary | ICD-10-CM | POA: Diagnosis not present

## 2019-07-15 DIAGNOSIS — E782 Mixed hyperlipidemia: Secondary | ICD-10-CM | POA: Diagnosis not present

## 2019-07-15 DIAGNOSIS — Z125 Encounter for screening for malignant neoplasm of prostate: Secondary | ICD-10-CM | POA: Diagnosis not present

## 2019-08-08 DIAGNOSIS — H524 Presbyopia: Secondary | ICD-10-CM | POA: Diagnosis not present

## 2019-08-08 DIAGNOSIS — H5203 Hypermetropia, bilateral: Secondary | ICD-10-CM | POA: Diagnosis not present

## 2019-08-08 DIAGNOSIS — H2513 Age-related nuclear cataract, bilateral: Secondary | ICD-10-CM | POA: Diagnosis not present

## 2019-08-08 DIAGNOSIS — E119 Type 2 diabetes mellitus without complications: Secondary | ICD-10-CM | POA: Diagnosis not present

## 2019-09-26 DIAGNOSIS — I442 Atrioventricular block, complete: Secondary | ICD-10-CM | POA: Insufficient documentation

## 2019-09-30 ENCOUNTER — Other Ambulatory Visit: Payer: Self-pay

## 2019-09-30 ENCOUNTER — Encounter: Payer: Self-pay | Admitting: Internal Medicine

## 2019-09-30 ENCOUNTER — Ambulatory Visit (INDEPENDENT_AMBULATORY_CARE_PROVIDER_SITE_OTHER): Payer: Medicare Other | Admitting: Internal Medicine

## 2019-09-30 VITALS — BP 134/78 | HR 75 | Ht 71.0 in | Wt 194.8 lb

## 2019-09-30 DIAGNOSIS — R002 Palpitations: Secondary | ICD-10-CM

## 2019-09-30 DIAGNOSIS — I442 Atrioventricular block, complete: Secondary | ICD-10-CM

## 2019-09-30 DIAGNOSIS — I472 Ventricular tachycardia: Secondary | ICD-10-CM | POA: Diagnosis not present

## 2019-09-30 DIAGNOSIS — Z950 Presence of cardiac pacemaker: Secondary | ICD-10-CM

## 2019-09-30 DIAGNOSIS — H43811 Vitreous degeneration, right eye: Secondary | ICD-10-CM | POA: Diagnosis not present

## 2019-09-30 NOTE — Progress Notes (Signed)
      Patient Care Team: Altheimer, Legrand Como, MD as PCP - General (Endocrinology)   HPI  Nathan [name] is a 79 y.o. male seen in followup for pacemaker implantation Medtronic  for intermittent complete heart block undertaken in January 2010.   Hx of hypertension with labile BP   The patient denies chest pain, shortness of breath, nocturnal dyspnea, orthopnea or peripheral edema.  There have been palpitations assoc w lightheadedness  but no syncope.       DATE TEST EF   3/17 Echo   60-65 %           Date Cr K HgbA1c  10/18 0.7 4.1   6/19 0.73 4.7   12/20 0.67 4.7 6.6       Past Medical History:  Diagnosis Date  . Atrioventricular block, 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   Qualifier: Diagnosis of  By: [name], MD, [name]    . DM (diabetes mellitus) ([place])   . HTN (hypertension)   . Hyperlipidemia   . Nephrolithiasis   . Pacemaker-Medtronic 05/10/2011   Medtronic device implanted January 2010   . Vertigo     Past Surgical History:  Procedure Laterality Date  . APPENDECTOMY    . PACEMAKER PLACEMENT      Current Outpatient Medications  Medication Sig Dispense Refill  . aspirin 81 MG tablet Take 81 mg by mouth daily.      Marland Kitchen atorvastatin (LIPITOR) 40 MG tablet Take 1 tablet by mouth daily.    . Magnesium 100 MG TABS Take 1 tablet by mouth daily.    . metFORMIN (GLUCOPHAGE) 1000 MG tablet Take 1,000 mg by mouth 2 (two) times daily with a meal.      . nateglinide (STARLIX) 120 MG tablet Take 120 mg by mouth 2 (two) times daily as needed.    Marland Kitchen ZETIA 10 MG tablet Take 10 mg by mouth daily.      No current facility-administered medications for this visit.    Allergies  Allergen Reactions  . Niacin     REACTION: flushing  . Sulfonamide Derivatives Other (See Comments)    Unknown reaction to medication    Review of Systems negative except from HPI and PMH  Physical Exam BP 134/78   Pulse 75   Ht 5\' 11"  (1.803 m)   Wt 194 lb 12.8 oz (88.4 kg)   SpO2  98%   BMI 27.17 kg/m  Well developed and nourished in no acute distress HENT normal Carotids brisk  Neck supple with JVP-flat Clear Regular rate and rhythm, 2/6 systolic murmur  Abd-soft with active BS No Clubbing cyanosis edema Skin-warm and dry  A & Oriented  Grossly normal sensory and motor function    ECG  P-synchronous/ AV  pacing @ 73   Assessment and  Plan  Complete heart block now permanent  Pacemaker-Medtronic The patient's device was interrogated.  The information was reviewed. No changes were made in the programming.     Hypertension  Labile   VT nonsustained  Murmur    BP reasonably controlled  VT nonsustained, prognosis relates to LV function and with permanent pacing the concern of pacemaker cardiomyopathy will check echo; electrolytes ok 12/20   Euvolemic continue current meds  May have mild AS

## 2019-09-30 NOTE — Patient Instructions (Signed)

## 2019-10-09 DIAGNOSIS — E782 Mixed hyperlipidemia: Secondary | ICD-10-CM | POA: Diagnosis not present

## 2019-10-09 DIAGNOSIS — E1142 Type 2 diabetes mellitus with diabetic polyneuropathy: Secondary | ICD-10-CM | POA: Diagnosis not present

## 2019-10-09 DIAGNOSIS — E538 Deficiency of other specified B group vitamins: Secondary | ICD-10-CM | POA: Diagnosis not present

## 2019-10-11 DIAGNOSIS — Z950 Presence of cardiac pacemaker: Secondary | ICD-10-CM | POA: Diagnosis not present

## 2019-10-11 DIAGNOSIS — I442 Atrioventricular block, complete: Secondary | ICD-10-CM | POA: Diagnosis not present

## 2019-10-11 DIAGNOSIS — I10 Essential (primary) hypertension: Secondary | ICD-10-CM | POA: Diagnosis not present

## 2019-10-11 DIAGNOSIS — E538 Deficiency of other specified B group vitamins: Secondary | ICD-10-CM | POA: Diagnosis not present

## 2019-10-11 DIAGNOSIS — Z7984 Long term (current) use of oral hypoglycemic drugs: Secondary | ICD-10-CM | POA: Diagnosis not present

## 2019-10-11 DIAGNOSIS — E1142 Type 2 diabetes mellitus with diabetic polyneuropathy: Secondary | ICD-10-CM | POA: Diagnosis not present

## 2019-10-11 DIAGNOSIS — Z125 Encounter for screening for malignant neoplasm of prostate: Secondary | ICD-10-CM | POA: Diagnosis not present

## 2019-10-11 DIAGNOSIS — E782 Mixed hyperlipidemia: Secondary | ICD-10-CM | POA: Diagnosis not present

## 2019-10-24 DIAGNOSIS — H43811 Vitreous degeneration, right eye: Secondary | ICD-10-CM | POA: Diagnosis not present

## 2019-11-07 ENCOUNTER — Other Ambulatory Visit: Payer: Self-pay

## 2019-11-07 ENCOUNTER — Ambulatory Visit (INDEPENDENT_AMBULATORY_CARE_PROVIDER_SITE_OTHER): Payer: Medicare Other | Admitting: *Deleted

## 2019-11-07 DIAGNOSIS — I442 Atrioventricular block, complete: Secondary | ICD-10-CM

## 2019-11-07 LAB — CUP PACEART INCLINIC DEVICE CHECK
Battery Impedance: 1653 Ohm
Battery Remaining Longevity: 38 mo
Battery Voltage: 2.75 V
Brady Statistic AP VP Percent: 11 %
Brady Statistic AP VS Percent: 0 %
Brady Statistic AS VP Percent: 89 %
Brady Statistic AS VS Percent: 0 %
Date Time Interrogation Session: 20210408152624
Implantable Lead Implant Date: 20100125
Implantable Lead Implant Date: 20100125
Implantable Lead Location: 753859
Implantable Lead Location: 753860
Implantable Lead Model: 5076
Implantable Lead Model: 5076
Implantable Pulse Generator Implant Date: 20100125
Lead Channel Impedance Value: 410 Ohm
Lead Channel Impedance Value: 593 Ohm
Lead Channel Pacing Threshold Amplitude: 0.375 V
Lead Channel Pacing Threshold Amplitude: 0.5 V
Lead Channel Pacing Threshold Amplitude: 0.625 V
Lead Channel Pacing Threshold Amplitude: 0.75 V
Lead Channel Pacing Threshold Pulse Width: 0.4 ms
Lead Channel Pacing Threshold Pulse Width: 0.4 ms
Lead Channel Pacing Threshold Pulse Width: 0.4 ms
Lead Channel Pacing Threshold Pulse Width: 0.4 ms
Lead Channel Sensing Intrinsic Amplitude: 2.8 mV
Lead Channel Setting Pacing Amplitude: 2 V
Lead Channel Setting Pacing Amplitude: 2.5 V
Lead Channel Setting Pacing Pulse Width: 0.4 ms
Lead Channel Setting Sensing Sensitivity: 5.6 mV

## 2019-11-07 NOTE — Progress Notes (Signed)
Pacemaker check in clinic. Normal device function. Thresholds, sensing, impedances consistent with previous measurements. Device programmed to maximize longevity. AT/AF burden <0.1%. No high ventricular rates noted. Device programmed at appropriate safety margins. Histogram distribution appropriate for patient activity level. . Estimated longevity 3 years. Patients next in clinic check 05/21/20. Patient education completed.

## 2019-11-07 NOTE — Progress Notes (Addendum)
Patient mentioned two concerns during visit:  1) Reports he has been waiting to hear back about scheduling an echo. Dr. Olin Pia 09/30/19 OV note mentions ordering echo to assess LV function, but it does not appear to have been ordered.  2) Patient checks his BP every morning. On two occasions, his BP has been high (99991111 systolic), so he has taken leftover medication (sounds like he has both lisinopril and carvedilol at home). He is unable to confirm medications or doses, but reports he "takes them the way it says on the bottle." Advised patient to discontinue PRN use of lisinopril and/or carvedilol pending clarification from Dr. Caryl Comes regarding which medication he should be taking (if any). Patient also reports some lightheadedness on multiple occasions, most frequently when rolling over while lying in bed.   Chart forwarded to Dr. Caryl Comes and Rosann Auerbach, RN, for review and recommendations.

## 2019-11-11 NOTE — Addendum Note (Signed)
Addended by: Thora Lance on: 11/11/2019 01:38 PM   Modules accepted: Orders

## 2019-11-25 ENCOUNTER — Ambulatory Visit (HOSPITAL_COMMUNITY): Payer: Medicare Other | Attending: Internal Medicine

## 2019-11-25 ENCOUNTER — Other Ambulatory Visit: Payer: Self-pay

## 2019-11-25 DIAGNOSIS — I472 Ventricular tachycardia, unspecified: Secondary | ICD-10-CM

## 2019-11-25 DIAGNOSIS — R002 Palpitations: Secondary | ICD-10-CM | POA: Insufficient documentation

## 2020-01-08 DIAGNOSIS — I10 Essential (primary) hypertension: Secondary | ICD-10-CM | POA: Diagnosis not present

## 2020-01-08 DIAGNOSIS — Z125 Encounter for screening for malignant neoplasm of prostate: Secondary | ICD-10-CM | POA: Diagnosis not present

## 2020-01-08 DIAGNOSIS — E1142 Type 2 diabetes mellitus with diabetic polyneuropathy: Secondary | ICD-10-CM | POA: Diagnosis not present

## 2020-01-08 DIAGNOSIS — E782 Mixed hyperlipidemia: Secondary | ICD-10-CM | POA: Diagnosis not present

## 2020-01-10 DIAGNOSIS — E782 Mixed hyperlipidemia: Secondary | ICD-10-CM | POA: Diagnosis not present

## 2020-01-10 DIAGNOSIS — I442 Atrioventricular block, complete: Secondary | ICD-10-CM | POA: Diagnosis not present

## 2020-01-10 DIAGNOSIS — E1142 Type 2 diabetes mellitus with diabetic polyneuropathy: Secondary | ICD-10-CM | POA: Diagnosis not present

## 2020-01-10 DIAGNOSIS — Z7984 Long term (current) use of oral hypoglycemic drugs: Secondary | ICD-10-CM | POA: Diagnosis not present

## 2020-01-10 DIAGNOSIS — I10 Essential (primary) hypertension: Secondary | ICD-10-CM | POA: Diagnosis not present

## 2020-01-10 DIAGNOSIS — E538 Deficiency of other specified B group vitamins: Secondary | ICD-10-CM | POA: Diagnosis not present

## 2020-01-10 DIAGNOSIS — Z125 Encounter for screening for malignant neoplasm of prostate: Secondary | ICD-10-CM | POA: Diagnosis not present

## 2020-01-10 DIAGNOSIS — Z950 Presence of cardiac pacemaker: Secondary | ICD-10-CM | POA: Diagnosis not present

## 2020-03-02 ENCOUNTER — Telehealth: Payer: Self-pay | Admitting: Internal Medicine

## 2020-03-02 ENCOUNTER — Other Ambulatory Visit: Payer: Self-pay | Admitting: Internal Medicine

## 2020-03-02 NOTE — Telephone Encounter (Signed)
Pt calling stating that he needs Dr. Caryl Comes to prescribe carvedilol for him again because his blood pressure has increased. Pt would like Dr. Olin Pia nurse to give him a call back concerning this matter, because this medication was D/C by the provider. Please address

## 2020-03-02 NOTE — Telephone Encounter (Signed)
New message   *STAT* If patient is at the pharmacy, call can be transferred to refill team.   1. Which medications need to be refilled? (please list name of each medication and dose if known) Carvedilol 12.5 mg once a day  2. Which pharmacy/location (including street and city if local pharmacy) is medication to be sent to? [facility] [address], [place]  3. Do they need a 30 day or 90 day supply? 90 day

## 2020-03-03 ENCOUNTER — Other Ambulatory Visit: Payer: Self-pay | Admitting: Internal Medicine

## 2020-03-03 NOTE — Telephone Encounter (Signed)
Spoke with pt who reports his B/P has been running 100-110/60's.  Pt states B/P now in the area of 125/65-72 each morning.  Pt would like to restart Carvedilol.  Pt denies any additional symptoms at this time.  Pt advised B/P still WNL.  Pt will continue to take daily B/P and log.  Will forward information to Dr Caryl Comes to see if he is agreeable to restarting Carvedilol.  Pt verbalizes understanding and agrees with current plan.

## 2020-03-06 NOTE — Telephone Encounter (Signed)
M ogood am Not sure I understand his concern either Lets wait and see what the log shows Also who is his PCP Thanks SK

## 2020-03-09 NOTE — Telephone Encounter (Signed)
Attempted phone call to pt.  Left voicemail to contact RN at [phone]. 

## 2020-03-09 NOTE — Telephone Encounter (Signed)
Attempted phone call to pt.  Left voicemail to contact RN ar [phone].

## 2020-03-09 NOTE — Telephone Encounter (Signed)
Patient is returning call.  °

## 2020-03-09 NOTE — Telephone Encounter (Signed)
Spoke with pt and advised per Dr Caryl Comes B/P's are WNL and it is not necessary to restart Carvedilol at this time.

## 2020-03-09 NOTE — Telephone Encounter (Signed)
Spoke with pt and advised per Dr Caryl Comes to continue keeping B/P log.  Blood pressures continue to be within normal limits and adding back the Carvedilol is not necessary at this time.  May also follow up with PCP re: B/P.  Pt verbalizes understanding and agrees with current plan.

## 2020-05-06 ENCOUNTER — Telehealth: Payer: Self-pay | Admitting: Internal Medicine

## 2020-05-06 DIAGNOSIS — E782 Mixed hyperlipidemia: Secondary | ICD-10-CM | POA: Diagnosis not present

## 2020-05-06 DIAGNOSIS — E1142 Type 2 diabetes mellitus with diabetic polyneuropathy: Secondary | ICD-10-CM | POA: Diagnosis not present

## 2020-05-06 NOTE — Telephone Encounter (Signed)
Patient states he kept BP readings and is calling to give the results. He states he also has a question for Dr. Caryl Comes, but did not want to give any further information to me.

## 2020-05-06 NOTE — Telephone Encounter (Signed)
Called patient back about his message.  1-Patient wanted Dr. Caryl Comes to know that his BP has been fine since stopping medication.   2-Patient wanted to know if he could have his pacemaker changed out early, due to being worried about a shortage in the future. Patient stated he has 1 1/2 to 2 years left on his device. Talked to device clinic, they stated insurance will not cover early change outs. Also they stated if there was a shortage in his device, that they could always go with another device. Called patient back with response. Patient seems to be more at ease with this answer and thanked me for the call back.

## 2020-05-08 DIAGNOSIS — Z125 Encounter for screening for malignant neoplasm of prostate: Secondary | ICD-10-CM | POA: Diagnosis not present

## 2020-05-08 DIAGNOSIS — I10 Essential (primary) hypertension: Secondary | ICD-10-CM | POA: Diagnosis not present

## 2020-05-08 DIAGNOSIS — E1142 Type 2 diabetes mellitus with diabetic polyneuropathy: Secondary | ICD-10-CM | POA: Diagnosis not present

## 2020-05-08 DIAGNOSIS — E782 Mixed hyperlipidemia: Secondary | ICD-10-CM | POA: Diagnosis not present

## 2020-05-08 DIAGNOSIS — E538 Deficiency of other specified B group vitamins: Secondary | ICD-10-CM | POA: Diagnosis not present

## 2020-05-08 DIAGNOSIS — I442 Atrioventricular block, complete: Secondary | ICD-10-CM | POA: Diagnosis not present

## 2020-05-08 DIAGNOSIS — Z950 Presence of cardiac pacemaker: Secondary | ICD-10-CM | POA: Diagnosis not present

## 2020-05-08 DIAGNOSIS — Z7984 Long term (current) use of oral hypoglycemic drugs: Secondary | ICD-10-CM | POA: Diagnosis not present

## 2020-05-12 ENCOUNTER — Ambulatory Visit (INDEPENDENT_AMBULATORY_CARE_PROVIDER_SITE_OTHER): Payer: Medicare Other | Admitting: Emergency Medicine

## 2020-05-12 ENCOUNTER — Other Ambulatory Visit: Payer: Self-pay

## 2020-05-12 DIAGNOSIS — I442 Atrioventricular block, complete: Secondary | ICD-10-CM | POA: Diagnosis not present

## 2020-05-12 LAB — CUP PACEART INCLINIC DEVICE CHECK
Battery Impedance: 1907 Ohm
Battery Remaining Longevity: 33 mo
Battery Voltage: 2.74 V
Brady Statistic AP VP Percent: 8 %
Brady Statistic AP VS Percent: 0 %
Brady Statistic AS VP Percent: 92 %
Brady Statistic AS VS Percent: 0 %
Date Time Interrogation Session: 20211012102955
Implantable Lead Implant Date: 20100125
Implantable Lead Implant Date: 20100125
Implantable Lead Location: 753859
Implantable Lead Location: 753860
Implantable Lead Model: 5076
Implantable Lead Model: 5076
Implantable Pulse Generator Implant Date: 20100125
Lead Channel Impedance Value: 418 Ohm
Lead Channel Impedance Value: 576 Ohm
Lead Channel Pacing Threshold Amplitude: 0.375 V
Lead Channel Pacing Threshold Amplitude: 0.625 V
Lead Channel Pacing Threshold Pulse Width: 0.4 ms
Lead Channel Pacing Threshold Pulse Width: 0.4 ms
Lead Channel Sensing Intrinsic Amplitude: 1 mV
Lead Channel Setting Pacing Amplitude: 2 V
Lead Channel Setting Pacing Amplitude: 2.5 V
Lead Channel Setting Pacing Pulse Width: 0.4 ms
Lead Channel Setting Sensing Sensitivity: 5.6 mV

## 2020-05-12 NOTE — Progress Notes (Signed)
Pacemaker check in clinic. Normal device function. Thresholds, sensing, impedances consistent with previous measurements. Device programmed to maximize longevity. AT/AF burden <0.1%. No high ventricular rates noted. Device programmed at appropriate safety margins. Histogram distribution appropriate for patient activity level. Estimated longevity 33 months. Patients next in-clinic check 11/13/19 . Patient education completed.

## 2020-08-11 DIAGNOSIS — H2513 Age-related nuclear cataract, bilateral: Secondary | ICD-10-CM | POA: Diagnosis not present

## 2020-08-11 DIAGNOSIS — H353121 Nonexudative age-related macular degeneration, left eye, early dry stage: Secondary | ICD-10-CM | POA: Diagnosis not present

## 2020-08-11 DIAGNOSIS — H524 Presbyopia: Secondary | ICD-10-CM | POA: Diagnosis not present

## 2020-08-11 DIAGNOSIS — E119 Type 2 diabetes mellitus without complications: Secondary | ICD-10-CM | POA: Diagnosis not present

## 2020-08-18 DIAGNOSIS — S63253A Unspecified dislocation of left middle finger, initial encounter: Secondary | ICD-10-CM | POA: Diagnosis not present

## 2020-08-24 ENCOUNTER — Telehealth: Payer: Self-pay | Admitting: Internal Medicine

## 2020-08-24 NOTE — Telephone Encounter (Signed)
Spoke with pt who reports began having vertigo yesterday.  Pt states when he sits or stands the room begins to spin and he becomes nauseated and has even vomited.  Pt reports he has never had vertigo before.  Pt states he has been checking his B/P and reports when he tries to sit or stand and becomes nauseous his BP will spike to 170/90 and has been as high as 190/100.  Pt reports when he lies down his BP is normal.  Pt states current BP 130/70.  Pt states he used to be on Carvedilol and Lisinopril for hypertension but these meds were stopped when he began taking Mg as it lowered his BP.  Pt states last night he took a Carvedilol and Lisinopril that he had on hand previously.  Pt is wanting to know if he needs to go back on BP medication regularly.    Pt advised first and foremost he needs to have vertigo evaluated by his PCP or urgent care.  Once vertigo has subsided or under control it will be important for pt to check his BP daily the same time each day and keep a log in order to obtain accurate readings.  Pt states for now if his BP is high he is going to take old BP medication even though it may be expired.  Pt advised will forward information to Dr Caryl Comes for review and recommendation.  Reiterated again the importance of seeing PCP or urgent care for evaluation of vertigo.  Pt verbalizes understanding and agrees with current plan.

## 2020-08-24 NOTE — Telephone Encounter (Signed)
Pt c/o BP issue: STAT if pt c/o blurred vision, one-sided weakness or slurred speech  1. What are your last 5 BP readings? 130/70, 170/90  2. Are you having any other symptoms (ex. Dizziness, headache, blurred vision, passed out)? Dizziness   3. What is your BP issue? Patient states his BP has been spiking when he sits up or stands up. He states he also gets dizzy. He states this morning it was normal, but last night it spiked.

## 2020-08-27 NOTE — Telephone Encounter (Signed)
Nathan [name] is calling to inform Rosann Auerbach he has started to record his BP reading since his vertigo is beginning to improve. He states he will callback to report the readings once he has more information to give. He also stated he wanted to apologize to her for being rude, he states he wasn't feeling well that day and doesn't normally act like that. Please advise.

## 2020-09-03 DIAGNOSIS — E782 Mixed hyperlipidemia: Secondary | ICD-10-CM | POA: Diagnosis not present

## 2020-09-03 DIAGNOSIS — I10 Essential (primary) hypertension: Secondary | ICD-10-CM | POA: Diagnosis not present

## 2020-09-03 DIAGNOSIS — E1142 Type 2 diabetes mellitus with diabetic polyneuropathy: Secondary | ICD-10-CM | POA: Diagnosis not present

## 2020-09-03 NOTE — Telephone Encounter (Signed)
Spoke with pt who has been struggling with vertigo the last couple of weeks along with "erratic blood pressures"  Pt states he was on Lisinopril 5mg  daily and Carvedilol 12.5mg  bid.  Pt states he stopped taking these medications when he was started on Mg d/t lower blood pressures.  Pt reports he is no longer on Mg and has started back on Lisinopril 5mg  daily and Carvedilol 12.5mg  bid as instructed by Dr Caryl Comes to take both meds at bedtime and check BP in the am.  If BP above 110 then he may take am dose of Carvedilol.  Pt has been keeping a log of BP's since vertigo has more or less resolved and BP is WNL.  Pt reports he will need a refill of Carvedilol prior to seeing Dr Caryl Comes in March 2022.  Pt advised will forward information to Dr Caryl Comes for review and recommendation.  Pt verbalizes understanding and agrees with current plan.

## 2020-09-03 NOTE — Telephone Encounter (Signed)
    Pt is calling back and requesting to speak with Rosann Auerbach. He said he have some info he needs to give to her

## 2020-09-03 NOTE — Telephone Encounter (Signed)
Glad to hear vertigo has resolved And woujld continue him on the BP regime that it sounds like it was working Thanks SK

## 2020-09-04 DIAGNOSIS — I10 Essential (primary) hypertension: Secondary | ICD-10-CM | POA: Diagnosis not present

## 2020-09-04 DIAGNOSIS — I442 Atrioventricular block, complete: Secondary | ICD-10-CM | POA: Diagnosis not present

## 2020-09-04 DIAGNOSIS — E538 Deficiency of other specified B group vitamins: Secondary | ICD-10-CM | POA: Diagnosis not present

## 2020-09-04 DIAGNOSIS — Z950 Presence of cardiac pacemaker: Secondary | ICD-10-CM | POA: Diagnosis not present

## 2020-09-04 DIAGNOSIS — Z7984 Long term (current) use of oral hypoglycemic drugs: Secondary | ICD-10-CM | POA: Diagnosis not present

## 2020-09-04 DIAGNOSIS — Z125 Encounter for screening for malignant neoplasm of prostate: Secondary | ICD-10-CM | POA: Diagnosis not present

## 2020-09-04 DIAGNOSIS — E1142 Type 2 diabetes mellitus with diabetic polyneuropathy: Secondary | ICD-10-CM | POA: Diagnosis not present

## 2020-09-04 DIAGNOSIS — E782 Mixed hyperlipidemia: Secondary | ICD-10-CM | POA: Diagnosis not present

## 2020-09-04 MED ORDER — CARVEDILOL 12.5 MG PO TABS: 12.5000 mg | ORAL_TABLET | Freq: Two times a day (BID) | ORAL | 3 refills | Status: DC

## 2020-09-04 MED ORDER — LISINOPRIL 5 MG PO TABS: 5.0000 mg | ORAL_TABLET | Freq: Every day | ORAL | 3 refills | Status: DC

## 2020-09-04 NOTE — Telephone Encounter (Signed)
Per Dr Caryl Comes, Carvedilol 12.5mg  - 1 tablet bid, take am dose on if systolic BP is 893 or greater sent to pt's pharmacy.  Lisinopril 5mg  added to pt's MAR.  Spoke with pt and advised Rx sent to [facility].  Pt verbalized understanding and thanked Therapist, sports for the call.

## 2020-09-11 ENCOUNTER — Telehealth: Payer: Self-pay | Admitting: Internal Medicine
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
## 2020-09-14 DIAGNOSIS — S0990XS Unspecified injury of head, sequela: Secondary | ICD-10-CM | POA: Diagnosis not present

## 2020-09-14 DIAGNOSIS — R42 Dizziness and giddiness: Secondary | ICD-10-CM | POA: Diagnosis not present

## 2020-10-26 ENCOUNTER — Ambulatory Visit (INDEPENDENT_AMBULATORY_CARE_PROVIDER_SITE_OTHER): Payer: Medicare Other | Admitting: Internal Medicine

## 2020-10-26 ENCOUNTER — Other Ambulatory Visit: Payer: Self-pay

## 2020-10-26 ENCOUNTER — Encounter: Payer: Self-pay | Admitting: Internal Medicine

## 2020-10-26 VITALS — BP 120/88 | HR 69 | Ht 72.0 in | Wt 187.0 lb

## 2020-10-26 DIAGNOSIS — I442 Atrioventricular block, complete: Secondary | ICD-10-CM

## 2020-10-26 DIAGNOSIS — Z950 Presence of cardiac pacemaker: Secondary | ICD-10-CM

## 2020-10-26 NOTE — Patient Instructions (Addendum)
Medication Instructions:  Your physician recommends that you continue on your current medications as directed. Please refer to the Current Medication list given to you today.  *If you need a refill on your cardiac medications before your next appointment, please call your pharmacy*   Lab Work: None ordered.  If you have labs (blood work) drawn today and your tests are completely normal, you will receive your results only by: Marland Kitchen MyChart Message (if you have MyChart) OR . A paper copy in the mail If you have any lab test that is abnormal or we need to change your treatment, we will call you to review the results.   Testing/Procedures: None ordered.    Follow-Up: At [facility], you and your health needs are our priority.  As part of our continuing mission to provide you with exceptional heart care, we have created designated Provider Care Teams.  These Care Teams include your primary Cardiologist (physician) and Advanced Practice Providers (APPs -  Physician Assistants and Nurse Practitioners) who all work together to provide you with the care you need, when you need it.  We recommend signing up for the patient portal called "MyChart".  Sign up information is provided on this After Visit Summary.  MyChart is used to connect with patients for Virtual Visits (Telemedicine).  Patients are able to view lab/test results, encounter notes, upcoming appointments, etc.  Non-urgent messages can be sent to your provider as well.   To learn more about what you can do with MyChart, go to [email or web address].    Your next appointment:    ** 6 month pacer check  ** 12 months with Dr Caryl Comes

## 2020-10-26 NOTE — Progress Notes (Signed)
Patient Care Team: Altheimer, Legrand Como, MD as PCP - General (Endocrinology)   HPI  Nathan [name] is a 80 y.o. male seen in followup for pacemaker implantation Medtronic  for intermittent complete heart block undertaken in January 2010.   Hx of hypertension with labile BP   The patient denies chest pain, shortness of breath, nocturnal dyspnea, orthopnea or peripheral edema.  There have been palpitations assoc w lightheadedness  but no syncope  Not vaccinated  Fall on the ice with subsequent prolonged vertigo  Now resolved  CT-head neg.       DATE TEST EF   3/17 Echo   60-65 %   4/21 Echo  >75% AS mild  Grad-m 13     Date Cr K HgbA1c  10/18 0.7 4.1   6/19 0.73 4.7   12/20 0.67 4.7 6.6            Past Medical History:  Diagnosis Date  . Atrioventricular block, 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   Qualifier: Diagnosis of  By: [name], MD, [name]    . DM (diabetes mellitus) ([place])   . HTN (hypertension)   . Hyperlipidemia   . Nephrolithiasis   . Pacemaker-Medtronic 05/10/2011   Medtronic device implanted January 2010   . Vertigo     Past Surgical History:  Procedure Laterality Date  . APPENDECTOMY    . PACEMAKER PLACEMENT      Current Outpatient Medications  Medication Sig Dispense Refill  . aspirin 81 MG tablet Take 81 mg by mouth daily.    Marland Kitchen atorvastatin (LIPITOR) 40 MG tablet Take 1 tablet by mouth daily.    . carvedilol (COREG) 12.5 MG tablet Take 1 tablet (12.5 mg total) by mouth 2 (two) times daily. Take am dose if systolic BP is 539 or greater. 180 tablet 3  . lisinopril (ZESTRIL) 5 MG tablet Take 1 tablet (5 mg total) by mouth daily. 90 tablet 3  . metFORMIN (GLUCOPHAGE) 1000 MG tablet Take 1,000 mg by mouth 2 (two) times daily with a meal.    . nateglinide (STARLIX) 120 MG tablet Take 120 mg by mouth 2 (two) times daily as needed.    Marland Kitchen ZETIA 10 MG tablet Take 10 mg by mouth daily.      No current facility-administered medications for  this visit.    Allergies  Allergen Reactions  . Sulfonamide Derivatives Other (See Comments)    Unknown reaction to medication  . Cyclobenzaprine Other (See Comments)  . Niacin Other (See Comments)    REACTION: flushing  . Olmesartan Swelling    Prior Benicar in 2005-2011; stopped due to low BP  . Pioglitazone Other (See Comments)    Achilles pain; ocular symptoms (twice)  . Valsartan Other (See Comments)    Started in 2011; 80 mg daily; he stopped in 7/15 due to dizziness despite dose reduction    Review of Systems negative except from HPI and PMH  Physical Exam BP 120/88   Pulse 69   Ht 6' (1.829 m)   Wt 187 lb (84.8 kg)   SpO2 99%   BMI 25.36 kg/m  Well developed and well nourished in no acute distress HENT normal Neck supple with JVP-flat Clear Device pocket well healed; without hematoma or erythema.  There is no tethering  Regular rate and rhythm, no  murmur Abd-soft with active BS No Clubbing cyanosis   edema Skin-warm and dry A & Oriented  Grossly normal sensory and motor function  ECG  AV  pacing     Assessment and  Plan  Complete heart block   Pacemaker-Medtronic  The patient's device was interrogated.  The information was reviewed. No changes were made in the programming.     Hypertension  Labile   VT nonsustained   BP well controlled  VT nonsustained  Device dependent

## 2020-12-08 DIAGNOSIS — M25512 Pain in left shoulder: Secondary | ICD-10-CM | POA: Diagnosis not present

## 2021-02-09 DIAGNOSIS — H353121 Nonexudative age-related macular degeneration, left eye, early dry stage: Secondary | ICD-10-CM | POA: Diagnosis not present

## 2021-03-04 DIAGNOSIS — R1310 Dysphagia, unspecified: Secondary | ICD-10-CM | POA: Diagnosis not present

## 2021-03-09 DIAGNOSIS — R1310 Dysphagia, unspecified: Secondary | ICD-10-CM | POA: Diagnosis not present

## 2021-03-09 DIAGNOSIS — K225 Diverticulum of esophagus, acquired: Secondary | ICD-10-CM | POA: Diagnosis not present

## 2021-03-09 NOTE — Unmapped External Note (Signed)
 Speech-Language Pathology Evaluation  OutPatient Pharyngeal Function Study (Modified Barium Swallow): CPT 92611  Nathan [name]. 80 y.o.   Outpatient Payor: MEDICARE / Plan: MEDICARE PART A&B / Product Type: Indemnity /   At the request of Dr. Burnie, a Pharyngeal Function Study (PFS) was conducted on March 09, 2021 .   Past and current medical history has been reviewed and can be found in patient's medical record.   Current Outpatient Medications:  .  ACCU-CHEK FASTCLIX LANCET DRUM Misc, USE TO TEST BLOOD SUGAR THREE TIMES A DAY, Disp: 102 each, Rfl: 2 .  ACCU-CHEK GUIDE GLUCOSE METER Misc, Use to test blood sugar 4 times a day (Dx:  E11.65), Disp: 1 each, Rfl: 2 .  ACCU-CHEK GUIDE TEST STRIPS Strp test strips, Use to test blood sugar 4 times a day (Dx:  E11.65), Disp: 100 strip, Rfl: 12 .  aspirin  81 MG EC tablet *ANTIPLATELET*, Take 1 tablet once a day, Disp: , Rfl:  .  atorvastatin  (LIPITOR) 40 MG tablet, Take 1 tablet once a day for cholesterol, Disp: 90 tablet, Rfl: 4 .  b complex vitamins tablet, Take 1 tablet once a day  , Disp: , Rfl:  .  carvediloL  (COREG ) 12.5 MG tablet, Take 1 tablet twice a day for blood pressure (skip if under 110), Disp: , Rfl:  .  ezetimibe  (ZETIA ) 10 mg tablet, Take 1 tablet once a day for cholesterol, Disp: 90 tablet, Rfl: 4 .  lisinopriL  (PRINIVIL ,ZESTRIL ) 5 MG tablet, Take 1 tablet once a day for blood pressure, Disp: 90 tablet, Rfl: 4 .  magnesium oxide,aspartate,citr 400 mg magnesium capsule, Take 1 tablet once a day  , Disp: , Rfl:  .  metFORMIN  (GLUCOPHAGE ) 500 MG tablet, Take 2 tablets with meals twice a day for diabetes, Disp: 360 tablet, Rfl: 4 .  nateglinide  (STARLIX ) 120 MG tablet, Take 1 tablet before meals 3 times a day for diabetes, Disp: 270 tablet, Rfl: 4 Allergies as of 03/09/2021 - Review Complete 03/04/2021  Allergen Reaction Noted  . Benicar [olmesartan] Hypotension (ALLERGY/intolerance) 09/03/2016  . Flexeril  [cyclobenzaprine] Tremor (intolerance) 08/28/2016  . Niacin Flushing (ALLERGY/intolerance)   . Pioglitazone Other (See Comments) 08/28/2016  . Sulfa (sulfonamide antibiotics) Rash (ALLERGY/intolerance)   . Valsartan Dizziness (intolerance) 09/03/2016    Procedure: Study performed in conjunction with the radiologist to assess the oral and pharyngeal  phases of the swallow in the lateral and/or anterior-posterior view to assess swallowing physiology  and aspiration risk. Test boluses were administered as indicated below. All boluses were mixed with barium for visualization. The study was recorded.  Assessment:  Current Diet: Regular Mental Status: Alert Oral Hygiene: Good   Test Boluses: Bolus Given: Thin liquids, Puree, Solid, Pill Liquids Provided Via: Cup   Findings: Oral Phase: WFL Swallow Initiation Phase: Delayed Residue: Mild- < half the bolus remains, in the valleculae Laryngeal Penetration Occurred with: No consistencies Laryngeal Penetration Was: N/A Aspiration Occurred With: No consistencies Aspiration Was: N/A Esophageal Regurgitation into Hypopharynx: Yes Opening of the UES/Cricopharyngeus: Reduced (CP bar impeding PES by approximately 75%)  Penetration-Aspiration Scale (PAS): Thin Liquid: 1 Puree: 1 Solid: 1 Pill: 1 1 Material does not enter airway  2 Material enters the airway, remains above the vocal folds and is ejected from the airway  3 Material enters the airway, remains above the vocal folds and is not ejected from the airway  4 Material enters the airway, contacts the vocal folds and is ejected from the airway  5 Material  enters the airway, contacts the vocal folds and is not ejected from the airway  6 Material enters the airway, passes below the vocal folds and is ejected into the larynx or out of the airway  7 Material enters the airway, passes below the vocal folds and is not ejected from the trachea despite effort  8 Material enters the airway,  passes below the vocal folds and no effort is made to eject   Compensatory Techniques Attempted: Alternate Liquids/Solids: Inconsistently effective Double Swallow: Inconsistently effective Multiple Swallows: Effective      Subjective: Patient reports difficulty swallowing pills and a globus sensation. He reports a history of GERD, but not currently treating or symptomatic.  Objective: Administered trials of thin barium, barium tablet, puree/pudding, solid. Conducted study in the lateral projection. Anatomic view in the lateral projection was normal.   Swallowing components observed: Lip closure: WFL Tongue Control: mild loss of bolus over BOT Bolus preparation/mastication: WFL Bolus transport/lingual motion: WFL Oral residue: trace on BOT Swallow initiation: at the level of the vallecula Soft Palate Elevation: complete nasopharyngeal closure Laryngeal Elevation: WFL Anterior hyoid movement: WFL Epiglottic movement: no inversion, narrow/thickening of posterior pharyngeal wall, resulting in mild vallecular residue Laryngeal vestibule closure: complete Pharyngeal stripping wave: WFL Pharyngeal constriction: WFL PES Opening: reduced with CP bar impeding approx. 75% of PES Tongue base retraction: WFL Pharyngeal Residue: none Esophageal clearance: slow but complete  Impression:  Patient presents with pharyngoesophageal dysphagia evidenced by no inversion of epiglottis likely caused by narrowing or thickening of the posterior pharyngeal wall which results in mild vallecular residue, the PES opening reduced with a significant CP bar impeding approximately 75% of opening. Barium tablet became stuck in the vallecula. Pt coughed tablet back up to oral cavity where he re-swallowed with it sticking in the vallecula and again coughed up to mouth and eventually expectorated. Patient presents today with hoarse voice quality, reports food and liquids regurgitated up to mouth, pills sticking and  globus sensation, all these symptoms are consistent with reflux and may need additional workup.    Laryngeal penetration/aspiration: No penetration or aspiration across consistencies  Recommendations: Regular diet, consider medications to be crushed in puree (if permissible) or suspension due to risk of pill passing into airway. 2. Compensatory strategies: eating most foods that can be fully chewed/ground to pass through pharyngeal area as well as PES.  3. ENT/GI to address CP Bar and underlying causes     Prognosis: Prognosis: Upcoming medical plans (consider addressing CP bar)    Re-Evaluate: Re-Evaluate : as clinically indicated  Contact the SLP office at [phone] to set up outpatient re-evaluation as indicated and/or if difficulty observed or if patient status changes.    Recommendations: Diet: Regular Aspiration Precautions: General Precautions: sit upright, slow rate, fully swallow bites/sips, only eat when awake/alert, assist prn, fully swallow before taking the next bite or sip, take small bites and sips, alternate liquids and puree/grounds/solids Administer Medications: one at a time, crushed, if permissible, with recommended liquids, with puree Other recommendations: good oral care, consider ENT consult regarding- (CP bar, reduced PES opening)  Education:  Results of this study were thoroughly discussed with the patient.  If you have any questions regarding this study, Please notify Lauraine PARAS. Ward: Pager [phone] ext [phone]. Thank you for this referral.   Time Calculations Time In: 0845 Time Out: 0903 Time Calculation (min): 18 min         Electronically signed by: Lauraine Molt Ward, CCC-SLP 03/09/21 1144

## 2021-05-26 ENCOUNTER — Ambulatory Visit (INDEPENDENT_AMBULATORY_CARE_PROVIDER_SITE_OTHER): Payer: Medicare Other

## 2021-05-26 ENCOUNTER — Other Ambulatory Visit: Payer: Self-pay

## 2021-05-26 DIAGNOSIS — I442 Atrioventricular block, complete: Secondary | ICD-10-CM | POA: Diagnosis not present

## 2021-05-26 LAB — CUP PACEART INCLINIC DEVICE CHECK
Battery Impedance: 3268 Ohm
Battery Remaining Longevity: 18 mo
Battery Voltage: 2.69 V
Brady Statistic AP VP Percent: 26 %
Brady Statistic AP VS Percent: 0 %
Brady Statistic AS VP Percent: 74 %
Brady Statistic AS VS Percent: 0 %
Date Time Interrogation Session: 20221026154010
Implantable Lead Implant Date: 20100125
Implantable Lead Implant Date: 20100125
Implantable Lead Location: 753859
Implantable Lead Location: 753860
Implantable Lead Model: 5076
Implantable Lead Model: 5076
Implantable Pulse Generator Implant Date: 20100125
Lead Channel Impedance Value: 417 Ohm
Lead Channel Impedance Value: 593 Ohm
Lead Channel Pacing Threshold Amplitude: 0.375 V
Lead Channel Pacing Threshold Amplitude: 0.625 V
Lead Channel Pacing Threshold Pulse Width: 0.4 ms
Lead Channel Pacing Threshold Pulse Width: 0.4 ms
Lead Channel Sensing Intrinsic Amplitude: 1.4 mV
Lead Channel Setting Pacing Amplitude: 2 V
Lead Channel Setting Pacing Amplitude: 2.5 V
Lead Channel Setting Pacing Pulse Width: 0.4 ms
Lead Channel Setting Sensing Sensitivity: 4 mV

## 2021-05-26 NOTE — Progress Notes (Signed)
Pacemaker check in clinic. Normal device function. Thresholds, sensing, impedances consistent with previous measurements. Device programmed to maximize longevity. AT/AF burden <0.1%, no EGM's available. No high ventricular rates noted. Device programmed at appropriate safety margins. Histogram distribution appropriate for patient activity level. Device programmed to optimize intrinsic conduction. Estimated longevity 17 months. Patient does not do remotes. Has recall in with [facility] 09/2020 (device will be checked at that [place]).  Patient education completed.

## 2021-06-10 DIAGNOSIS — E782 Mixed hyperlipidemia: Secondary | ICD-10-CM | POA: Diagnosis not present

## 2021-06-10 DIAGNOSIS — I10 Essential (primary) hypertension: Secondary | ICD-10-CM | POA: Diagnosis not present

## 2021-06-10 DIAGNOSIS — E1142 Type 2 diabetes mellitus with diabetic polyneuropathy: Secondary | ICD-10-CM | POA: Diagnosis not present

## 2021-06-11 DIAGNOSIS — E113299 Type 2 diabetes mellitus with mild nonproliferative diabetic retinopathy without macular edema, unspecified eye: Secondary | ICD-10-CM | POA: Diagnosis not present

## 2021-06-11 DIAGNOSIS — Z87891 Personal history of nicotine dependence: Secondary | ICD-10-CM | POA: Diagnosis not present

## 2021-06-11 DIAGNOSIS — E785 Hyperlipidemia, unspecified: Secondary | ICD-10-CM | POA: Diagnosis not present

## 2021-06-11 DIAGNOSIS — I10 Essential (primary) hypertension: Secondary | ICD-10-CM | POA: Diagnosis not present

## 2021-06-11 DIAGNOSIS — E1142 Type 2 diabetes mellitus with diabetic polyneuropathy: Secondary | ICD-10-CM | POA: Diagnosis not present

## 2021-06-11 DIAGNOSIS — Z7984 Long term (current) use of oral hypoglycemic drugs: Secondary | ICD-10-CM | POA: Diagnosis not present

## 2021-07-06 DIAGNOSIS — I10 Essential (primary) hypertension: Secondary | ICD-10-CM | POA: Diagnosis not present

## 2021-07-06 DIAGNOSIS — Z6826 Body mass index (BMI) 26.0-26.9, adult: Secondary | ICD-10-CM | POA: Diagnosis not present

## 2021-07-06 DIAGNOSIS — M5412 Radiculopathy, cervical region: Secondary | ICD-10-CM | POA: Diagnosis not present

## 2021-07-12 ENCOUNTER — Other Ambulatory Visit: Payer: Self-pay | Admitting: Pain Medicine

## 2021-07-12 DIAGNOSIS — M5412 Radiculopathy, cervical region: Secondary | ICD-10-CM

## 2021-07-16 ENCOUNTER — Ambulatory Visit
Admission: RE | Admit: 2021-07-16 | Discharge: 2021-07-16 | Disposition: A | Discharge status: Home / Self Care | Payer: Medicare Other | Source: Ambulatory Visit | Attending: Pain Medicine | Admitting: Pain Medicine

## 2021-07-16 DIAGNOSIS — M542 Cervicalgia: Secondary | ICD-10-CM | POA: Diagnosis not present

## 2021-07-16 DIAGNOSIS — R29890 Loss of height: Secondary | ICD-10-CM | POA: Diagnosis not present

## 2021-07-16 DIAGNOSIS — M2578 Osteophyte, vertebrae: Secondary | ICD-10-CM | POA: Diagnosis not present

## 2021-07-16 DIAGNOSIS — M5412 Radiculopathy, cervical region: Secondary | ICD-10-CM

## 2021-07-16 DIAGNOSIS — R202 Paresthesia of skin: Secondary | ICD-10-CM | POA: Diagnosis not present

## 2021-08-03 ENCOUNTER — Emergency Department (HOSPITAL_COMMUNITY): Payer: Medicare Other

## 2021-08-03 ENCOUNTER — Encounter (HOSPITAL_COMMUNITY): Payer: Self-pay

## 2021-08-03 ENCOUNTER — Emergency Department (HOSPITAL_COMMUNITY)
Admission: EM | Admit: 2021-08-03 | Discharge: 2021-08-03 | Disposition: A | Discharge status: Home / Self Care | Payer: Medicare Other | Attending: Emergency Medicine | Admitting: Emergency Medicine

## 2021-08-03 ENCOUNTER — Other Ambulatory Visit: Payer: Self-pay

## 2021-08-03 DIAGNOSIS — Z5321 Procedure and treatment not carried out due to patient leaving prior to being seen by health care provider: Secondary | ICD-10-CM | POA: Diagnosis not present

## 2021-08-03 DIAGNOSIS — R42 Dizziness and giddiness: Secondary | ICD-10-CM | POA: Insufficient documentation

## 2021-08-03 DIAGNOSIS — M5412 Radiculopathy, cervical region: Secondary | ICD-10-CM | POA: Diagnosis not present

## 2021-08-03 DIAGNOSIS — R112 Nausea with vomiting, unspecified: Secondary | ICD-10-CM | POA: Insufficient documentation

## 2021-08-03 DIAGNOSIS — I10 Essential (primary) hypertension: Secondary | ICD-10-CM | POA: Diagnosis not present

## 2021-08-03 DIAGNOSIS — R110 Nausea: Secondary | ICD-10-CM | POA: Diagnosis not present

## 2021-08-03 DIAGNOSIS — Z743 Need for continuous supervision: Secondary | ICD-10-CM | POA: Diagnosis not present

## 2021-08-03 LAB — CBC WITH DIFFERENTIAL/PLATELET
Abs Immature Granulocytes: 0.03 10*3/uL (ref 0.00–0.07)
Basophils Absolute: 0.1 10*3/uL (ref 0.0–0.1)
Basophils Relative: 1 %
Eosinophils Absolute: 0.1 10*3/uL (ref 0.0–0.5)
Eosinophils Relative: 1 %
HCT: 42.9 % (ref 39.0–52.0)
Hemoglobin: 15.1 g/dL (ref 13.0–17.0)
Immature Granulocytes: 0 %
Lymphocytes Relative: 17 %
Lymphs Abs: 1.4 10*3/uL (ref 0.7–4.0)
MCH: 32.3 pg (ref 26.0–34.0)
MCHC: 35.2 g/dL (ref 30.0–36.0)
MCV: 91.7 fL (ref 80.0–100.0)
Monocytes Absolute: 0.6 10*3/uL (ref 0.1–1.0)
Monocytes Relative: 8 %
Neutro Abs: 6.2 10*3/uL (ref 1.7–7.7)
Neutrophils Relative %: 73 %
Platelets: 229 10*3/uL (ref 150–400)
RBC: 4.68 MIL/uL (ref 4.22–5.81)
RDW: 12.4 % (ref 11.5–15.5)
WBC: 8.4 10*3/uL (ref 4.0–10.5)
nRBC: 0 % (ref 0.0–0.2)

## 2021-08-03 LAB — BASIC METABOLIC PANEL
Anion gap: 13 (ref 5–15)
BUN: 13 mg/dL (ref 8–23)
CO2: 23 mmol/L (ref 22–32)
Calcium: 9.6 mg/dL (ref 8.9–10.3)
Chloride: 98 mmol/L (ref 98–111)
Creatinine, Ser: 0.75 mg/dL (ref 0.61–1.24)
GFR, Estimated: >60 mL/min (ref >60)
Glucose, Bld: 142 mg/dL — ABNORMAL HIGH (ref 70–99)
Potassium: 3.7 mmol/L (ref 3.5–5.1)
Sodium: 134 mmol/L — ABNORMAL LOW (ref 135–145)

## 2021-08-03 LAB — CBG MONITORING, ED: Glucose-Capillary: 147 mg/dL — ABNORMAL HIGH (ref 70–99)

## 2021-08-03 MED ORDER — ONDANSETRON 4 MG PO TBDP
4.0000 mg | ORAL_TABLET | Freq: Once | ORAL | Status: AC
  Administered 2021-08-03: 4 mg via ORAL
  Filled 2021-08-03: qty 1

## 2021-08-03 NOTE — ED Notes (Signed)
Pt states he was told in the ambulance that they would check his pacemaker upon arrival. States he cant wait nine hours for that and will follow up with cardiology tomorrow. EMS IV removed by this Probation officer

## 2021-08-03 NOTE — ED Triage Notes (Signed)
Sudden onset of nausea and dizziness x 2 hrs ago,  Has a pacemaker that is near end of life per ems.  BP 190/100 HR 70

## 2021-08-03 NOTE — ED Provider Notes (Signed)
Emergency Medicine Provider Triage Evaluation Note  Nathan [name] , a 81 y.o. male  was evaluated in triage.  Pt complains of sudden onset vertiginous symptoms this afternoon.  Worse with position change. Hx of vertigo.  Also states that he feels like he is going to pass out.  This feels the same as his last episode of vertigo.  Review of Systems  Positive: Nausea vomiting balance issues Negative: Fever chills headache visual changes  Physical Exam  Ht 6' (1.829 m)    Wt 80.7 kg    BMI 24.14 kg/m  Gen:   Awake, no distress   Resp:  Normal effort  MSK:   Moves extremities without difficulty  Other:  nystagmus  Medical Decision Making  Medically screening exam initiated at 6:45 PM.  Appropriate orders placed.  Salley Slaughter was informed that the remainder of the evaluation will be completed by another provider, this initial triage assessment does not replace that evaluation, and the importance of remaining in the ED until their evaluation is complete.  Work-up initiated   Margarita Mail, PA-C 08/03/21 1852    Teressa Lower, MD 08/04/21 [phone]

## 2021-08-04 ENCOUNTER — Telehealth: Payer: Self-pay | Admitting: Internal Medicine

## 2021-08-04 ENCOUNTER — Ambulatory Visit (INDEPENDENT_AMBULATORY_CARE_PROVIDER_SITE_OTHER): Payer: Medicare Other

## 2021-08-04 DIAGNOSIS — I442 Atrioventricular block, complete: Secondary | ICD-10-CM | POA: Diagnosis not present

## 2021-08-04 LAB — CUP PACEART INCLINIC DEVICE CHECK
Battery Impedance: 4192 Ohm
Battery Remaining Longevity: 11 mo
Battery Voltage: 2.67 V
Brady Statistic AP VP Percent: 27 %
Brady Statistic AP VS Percent: 0 %
Brady Statistic AS VP Percent: 73 %
Brady Statistic AS VS Percent: 0 %
Date Time Interrogation Session: 20230104170632
Implantable Lead Implant Date: 20100125
Implantable Lead Implant Date: 20100125
Implantable Lead Location: 753859
Implantable Lead Location: 753860
Implantable Lead Model: 5076
Implantable Lead Model: 5076
Implantable Pulse Generator Implant Date: 20100125
Lead Channel Impedance Value: 430 Ohm
Lead Channel Impedance Value: 633 Ohm
Lead Channel Pacing Threshold Amplitude: 0.375 V
Lead Channel Pacing Threshold Amplitude: 0.625 V
Lead Channel Pacing Threshold Pulse Width: 0.4 ms
Lead Channel Pacing Threshold Pulse Width: 0.4 ms
Lead Channel Sensing Intrinsic Amplitude: 1.4 mV
Lead Channel Setting Pacing Amplitude: 2 V
Lead Channel Setting Pacing Amplitude: 2.5 V
Lead Channel Setting Pacing Pulse Width: 0.4 ms
Lead Channel Setting Sensing Sensitivity: 4 mV

## 2021-08-04 NOTE — Progress Notes (Signed)
Pacemaker check in clinic. Normal device function. Thresholds, sensing, impedances consistent with previous measurements. Device programmed to maximize longevity. No mode switch or high ventricular rates noted. Device programmed at appropriate safety margins. Histogram distribution appropriate for patient activity level. Device programmed to optimize intrinsic conduction. Estimated longevity <1-25 months. Patient declines remote monitoring. Patient education completed. Next remote check 09/02/21.

## 2021-08-04 NOTE — Telephone Encounter (Signed)
Spoke with pt who reports his current BP is 148/89 with HR 74.  Offered pt appointment with device clinic today at 240pm and he is agreeable to this.  Reviewed ED precautions.  Pt verbalizes understanding and agrees with current plan.

## 2021-08-04 NOTE — Telephone Encounter (Signed)
Patient c/o Palpitations:  High priority if patient c/o lightheadedness, shortness of breath, or chest pain  How long have you had palpitations/irregular HR/ Afib? Are you having the symptoms now?  Patient states he has been experiencing flutter for the past few days. He states last night he became dizzy and went to the ED, but went home due to long wait. No symptoms currently.  Are you currently experiencing lightheadedness, SOB or CP?  Dizziness   Do you have a history of afib (atrial fibrillation) or irregular heart rhythm?  Yes   Have you checked your BP or HR? (document readings if available):  01/03: 225/115  01/04: 177/105 76  Are you experiencing any other symptoms?  Dizziness, nausea  STAT if patient feels like he/she is going to faint   Are you dizzy now?  Yes  Do you feel faint or have you passed out?  Yes, patient states he feels faint right now  Do you have any other symptoms?  nausea  Have you checked your HR and BP (record if available)?   01/03: 225/115  01/04: 177/105 76

## 2021-08-04 NOTE — Telephone Encounter (Signed)
Spoke with pt who reports dizziness with vomiting and heart fluttering last night and tingling around his pacemaker x 2 weeks.  He has not been able to eat anything yet but is taking in fluids.  Pt reports BP elevated this am of 177/105 with HR of 76.  Pt had not taken his morning medications when he took this reading.    Pt with history of vertigo and labile hypertension.  Pt advised to recheck his BP 1-2 hours after taking Lisinopril and Carvedilol.  Requested pt follow up with his PCP for vertigo.  Pt states he does not currently have a PCP.  Provided pt with PCP hotline phone number [phone] and encouraged him to contact for assistance with finding a PCP.  Will speak with device RN about scheduling appointment for pacemaker check as pt does not do remote monitoring.  Will contact pt re appointment and to follow up on BP.  Pt verbalizes understanding and agrees with current plan.

## 2021-08-31 DIAGNOSIS — M5412 Radiculopathy, cervical region: Secondary | ICD-10-CM | POA: Diagnosis not present

## 2021-09-02 ENCOUNTER — Ambulatory Visit (INDEPENDENT_AMBULATORY_CARE_PROVIDER_SITE_OTHER): Payer: Medicare Other

## 2021-09-02 ENCOUNTER — Other Ambulatory Visit: Payer: Self-pay

## 2021-09-02 DIAGNOSIS — I442 Atrioventricular block, complete: Secondary | ICD-10-CM

## 2021-09-02 LAB — CUP PACEART INCLINIC DEVICE CHECK
Brady Statistic RA Percent Paced: 30.7 %
Brady Statistic RV Percent Paced: 99.9 %
Date Time Interrogation Session: 20230202104515
Implantable Lead Implant Date: 20100125
Implantable Lead Implant Date: 20100125
Implantable Lead Location: 753859
Implantable Lead Location: 753860
Implantable Lead Model: 5076
Implantable Lead Model: 5076
Implantable Pulse Generator Implant Date: 20100125
Lead Channel Setting Pacing Amplitude: 2 V
Lead Channel Setting Pacing Amplitude: 2.5 V
Lead Channel Setting Pacing Pulse Width: 0.4 ms
Lead Channel Setting Sensing Sensitivity: 4 mV

## 2021-09-02 NOTE — Progress Notes (Signed)
Pacemaker check in clinic. Normal device function.  Did not test leads given low battery life.  Device programmed to maximize longevity. No mode switch or high ventricular rates noted. Device programmed at appropriate safety margins. Histogram distribution appropriate for patient activity level. Estimated longevity 1-7 months until ERI. Patient  unable to complete remote follow-up, due to time remaining on device and dependancy, patient scheduled to return in 1 month for battery recheck.  . Patient education completed.

## 2021-09-23 DIAGNOSIS — H5203 Hypermetropia, bilateral: Secondary | ICD-10-CM | POA: Diagnosis not present

## 2021-09-23 DIAGNOSIS — H43811 Vitreous degeneration, right eye: Secondary | ICD-10-CM | POA: Diagnosis not present

## 2021-09-23 DIAGNOSIS — H353121 Nonexudative age-related macular degeneration, left eye, early dry stage: Secondary | ICD-10-CM | POA: Diagnosis not present

## 2021-09-23 DIAGNOSIS — E119 Type 2 diabetes mellitus without complications: Secondary | ICD-10-CM | POA: Diagnosis not present

## 2021-09-29 DIAGNOSIS — M5412 Radiculopathy, cervical region: Secondary | ICD-10-CM | POA: Diagnosis not present

## 2021-09-30 ENCOUNTER — Ambulatory Visit (INDEPENDENT_AMBULATORY_CARE_PROVIDER_SITE_OTHER): Payer: Medicare Other

## 2021-09-30 ENCOUNTER — Other Ambulatory Visit: Payer: Self-pay

## 2021-09-30 DIAGNOSIS — I442 Atrioventricular block, complete: Secondary | ICD-10-CM

## 2021-09-30 NOTE — Progress Notes (Signed)
In-clinic monthly battery check. Patient still has 2 months remaining until ERI. Next scheduled visit 10/29/21 with Dr. Caryl Comes.  ?

## 2021-10-29 ENCOUNTER — Ambulatory Visit (INDEPENDENT_AMBULATORY_CARE_PROVIDER_SITE_OTHER): Payer: Medicare Other | Admitting: Internal Medicine

## 2021-10-29 ENCOUNTER — Encounter: Payer: Self-pay | Admitting: Internal Medicine

## 2021-10-29 ENCOUNTER — Encounter: Payer: Self-pay | Admitting: *Deleted

## 2021-10-29 VITALS — BP 114/60 | HR 69 | Ht 72.0 in | Wt 187.6 lb

## 2021-10-29 DIAGNOSIS — Z01812 Encounter for preprocedural laboratory examination: Secondary | ICD-10-CM

## 2021-10-29 DIAGNOSIS — I442 Atrioventricular block, complete: Secondary | ICD-10-CM

## 2021-10-29 DIAGNOSIS — Z950 Presence of cardiac pacemaker: Secondary | ICD-10-CM | POA: Diagnosis not present

## 2021-10-29 NOTE — Patient Instructions (Addendum)
Medication Instructions:  ?Your physician has recommended you make the following change in your medication:  ?HOLD Zestril ?HOLD Coreg  ? ?*If you need a refill on your cardiac medications before your next appointment, please call your pharmacy* ? ? ?Lab Work: ?Pre procedure labs today: BMET & CBC ? ?If you have labs (blood work) drawn today and your tests are completely normal, you will receive your results only by: ?MyChart Message (if you have MyChart) OR ?A paper copy in the mail ?If you have any lab test that is abnormal or we need to change your treatment, we will call you to review the results. ? ? ?Testing/Procedures: ?Your physician has recommended that your pacemaker battery be changed.  Please see instruction letter given to you today ? ? ?Follow-Up: ?At [facility], you and your health needs are our priority.  As part of our continuing mission to provide you with exceptional heart care, we have created designated Provider Care Teams.  These Care Teams include your primary Cardiologist (physician) and Advanced Practice Providers (APPs -  Physician Assistants and Nurse Practitioners) who all work together to provide you with the care you need, when you need it. ? ?Your next appointment:   ?2 week(s) after your pacemaker battery change ? ?The format for your next appointment:   ?In Person ? ?Provider:   ?Device clinic for a wound check ? ? ? ?Thank you for choosing [facility]!! ? ? ?[phone] ? ?  ?

## 2021-10-29 NOTE — H&P (View-Only) (Signed)
? ? ?Patient Care Team: ?Altheimer, Legrand Como, MD as PCP - General (Endocrinology) ? ? ?HPI ? ?Nathan [name] is a 81 y.o. male ?seen in followup for pacemaker implantation Medtronic  for intermittent complete heart block undertaken in January 2010.  ? ?Hx of hypertension with labile BP  ? ?His device reached ERI earlier this week and has reverted to VVI associated with symptoms of lightheadedness low blood pressure fatigue and some dyspnea.  No chest pain. ? ?DATE TEST EF   ?3/17 Echo   60-65 %   ?4/21 Echo  >75% AS mild  Grad-m 13  ? ? ? ?Date Cr K Hgb HgbA1c  ?10/18 0.7 4.1    ?6/19 0.73 4.7    ?12/20 0.67 4.7  6.6  ?1/23 0.75 3.7 15.7   ? ? ? ? ? ?Past Medical History:  ?Diagnosis Date  ? Atrioventricular block, 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  ? Qualifier: Diagnosis of  By: [name], MD, [name]    ? DM (diabetes mellitus) ([place])   ? HTN (hypertension)   ? Hyperlipidemia   ? Nephrolithiasis   ? Pacemaker-Medtronic 05/10/2011  ? Medtronic device implanted January 2010   ? Vertigo   ? ? ?Past Surgical History:  ?Procedure Laterality Date  ? APPENDECTOMY    ? PACEMAKER PLACEMENT    ? ? ?Current Outpatient Medications  ?Medication Sig Dispense Refill  ? aspirin 81 MG tablet Take 81 mg by mouth daily.    ? atorvastatin (LIPITOR) 40 MG tablet Take 1 tablet by mouth daily.    ? carvedilol (COREG) 12.5 MG tablet Take 1 tablet (12.5 mg total) by mouth 2 (two) times daily. Take am dose if systolic BP is 811 or greater. 180 tablet 3  ? lisinopril (ZESTRIL) 5 MG tablet Take 1 tablet (5 mg total) by mouth daily. 90 tablet 3  ? metFORMIN (GLUCOPHAGE) 1000 MG tablet Take 1,000 mg by mouth 2 (two) times daily with a meal.    ? nateglinide (STARLIX) 120 MG tablet Take 120 mg by mouth 2 (two) times daily as needed.    ? ZETIA 10 MG tablet Take 10 mg by mouth daily.     ? ?No current facility-administered medications for this visit.  ? ? ?Allergies  ?Allergen Reactions  ? Sulfonamide Derivatives Other (See Comments)  ?   Unknown reaction to medication  ? Cyclobenzaprine Other (See Comments)  ? Niacin Other (See Comments)  ?  REACTION: flushing  ? Olmesartan Swelling  ?  Prior Benicar in 2005-2011; stopped due to low BP  ? Pioglitazone Other (See Comments)  ?  Achilles pain; ocular symptoms (twice)  ? Valsartan Other (See Comments)  ?  Started in 2011; 80 mg daily; he stopped in 7/15 due to dizziness despite dose reduction  ? ? ?Review of Systems negative except from HPI and PMH ? ?Physical Exam ?BP 114/60   Pulse 69   Ht 6' (1.829 m)   Wt 187 lb 9.6 oz (85.1 kg)   SpO2 97%   BMI 25.44 kg/m?  ?Well developed and well nourished in no acute distress ?HENT normal ?Neck supple cannon A wave ?Clear ?Device pocket well healed; without hematoma or erythema.  There is no tethering  ?Regular rate and rhythm, no  gallop No  murmur ?Abd-soft with active BS ?No Clubbing cyanosis  edema ?Skin-warm and dry ?A & Oriented  Grossly normal sensory and motor function ? ?ECG A-V dissociation with occasional capture beats or PVCs with ventricular pacing asynchronously with sinus rhythm ? ?  Assessment and  Plan ? ?Complete heart block  ? ?Pacemaker-Medtronic  T  ? ?Hypertension  Labile  ? ?VT nonsustained ? ? ?The patient's device has reverted to VVI creating a significant symptoms of lightheadedness and some shortness of breath.  We will undertake device generator placement next week.  We have reviewed the risks and benefits including but not limited to infection and lead fracture.  He understands these risks and is willing to proceed. ? ?Given his lightheadedness and his relatively low blood pressure we will have him hold his lisinopril and his carvedilol until after device generator replacement. ? ?  ? ?  ? ? ? ? ?

## 2021-10-29 NOTE — Progress Notes (Signed)
? ? ?Patient Care Team: ?Altheimer, Legrand Como, MD as PCP - General (Endocrinology) ? ? ?HPI ? ?Nathan [name] is a 81 y.o. male ?seen in followup for pacemaker implantation Medtronic  for intermittent complete heart block undertaken in January 2010.  ? ?Hx of hypertension with labile BP  ? ?His device reached ERI earlier this week and has reverted to VVI associated with symptoms of lightheadedness low blood pressure fatigue and some dyspnea.  No chest pain. ? ?DATE TEST EF   ?3/17 Echo   60-65 %   ?4/21 Echo  >75% AS mild  Grad-m 13  ? ? ? ?Date Cr K Hgb HgbA1c  ?10/18 0.7 4.1    ?6/19 0.73 4.7    ?12/20 0.67 4.7  6.6  ?1/23 0.75 3.7 15.7   ? ? ? ? ? ?Past Medical History:  ?Diagnosis Date  ? Atrioventricular block, 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  ? Qualifier: Diagnosis of  By: [name], MD, [name]    ? DM (diabetes mellitus) ([place])   ? HTN (hypertension)   ? Hyperlipidemia   ? Nephrolithiasis   ? Pacemaker-Medtronic 05/10/2011  ? Medtronic device implanted January 2010   ? Vertigo   ? ? ?Past Surgical History:  ?Procedure Laterality Date  ? APPENDECTOMY    ? PACEMAKER PLACEMENT    ? ? ?Current Outpatient Medications  ?Medication Sig Dispense Refill  ? aspirin 81 MG tablet Take 81 mg by mouth daily.    ? atorvastatin (LIPITOR) 40 MG tablet Take 1 tablet by mouth daily.    ? carvedilol (COREG) 12.5 MG tablet Take 1 tablet (12.5 mg total) by mouth 2 (two) times daily. Take am dose if systolic BP is 503 or greater. 180 tablet 3  ? lisinopril (ZESTRIL) 5 MG tablet Take 1 tablet (5 mg total) by mouth daily. 90 tablet 3  ? metFORMIN (GLUCOPHAGE) 1000 MG tablet Take 1,000 mg by mouth 2 (two) times daily with a meal.    ? nateglinide (STARLIX) 120 MG tablet Take 120 mg by mouth 2 (two) times daily as needed.    ? ZETIA 10 MG tablet Take 10 mg by mouth daily.     ? ?No current facility-administered medications for this visit.  ? ? ?Allergies  ?Allergen Reactions  ? Sulfonamide Derivatives Other (See Comments)  ?   Unknown reaction to medication  ? Cyclobenzaprine Other (See Comments)  ? Niacin Other (See Comments)  ?  REACTION: flushing  ? Olmesartan Swelling  ?  Prior Benicar in 2005-2011; stopped due to low BP  ? Pioglitazone Other (See Comments)  ?  Achilles pain; ocular symptoms (twice)  ? Valsartan Other (See Comments)  ?  Started in 2011; 80 mg daily; he stopped in 7/15 due to dizziness despite dose reduction  ? ? ?Review of Systems negative except from HPI and PMH ? ?Physical Exam ?BP 114/60   Pulse 69   Ht 6' (1.829 m)   Wt 187 lb 9.6 oz (85.1 kg)   SpO2 97%   BMI 25.44 kg/m?  ?Well developed and well nourished in no acute distress ?HENT normal ?Neck supple cannon A wave ?Clear ?Device pocket well healed; without hematoma or erythema.  There is no tethering  ?Regular rate and rhythm, no  gallop No  murmur ?Abd-soft with active BS ?No Clubbing cyanosis  edema ?Skin-warm and dry ?A & Oriented  Grossly normal sensory and motor function ? ?ECG A-V dissociation with occasional capture beats or PVCs with ventricular pacing asynchronously with sinus rhythm ? ?  Assessment and  Plan ? ?Complete heart block  ? ?Pacemaker-Medtronic  T  ? ?Hypertension  Labile  ? ?VT nonsustained ? ? ?The patient's device has reverted to VVI creating a significant symptoms of lightheadedness and some shortness of breath.  We will undertake device generator placement next week.  We have reviewed the risks and benefits including but not limited to infection and lead fracture.  He understands these risks and is willing to proceed. ? ?Given his lightheadedness and his relatively low blood pressure we will have him hold his lisinopril and his carvedilol until after device generator replacement. ? ?  ? ?  ? ? ? ? ?

## 2021-10-30 LAB — BASIC METABOLIC PANEL
BUN/Creatinine Ratio: 14 (ref 10–24)
BUN: 13 mg/dL (ref 8–27)
CO2: 22 mmol/L (ref 20–29)
Calcium: 9.5 mg/dL (ref 8.6–10.2)
Chloride: 98 mmol/L (ref 96–106)
Creatinine, Ser: 0.91 mg/dL (ref 0.76–1.27)
Glucose: 268 mg/dL — ABNORMAL HIGH (ref 70–99)
Potassium: 4.7 mmol/L (ref 3.5–5.2)
Sodium: 138 mmol/L (ref 134–144)
eGFR: 86 mL/min/{1.73_m2} (ref >59)

## 2021-10-30 LAB — CBC
Hematocrit: 41.3 % (ref 37.5–51.0)
Hemoglobin: 14.5 g/dL (ref 13.0–17.7)
MCH: 32 pg (ref 26.6–33.0)
MCHC: 35.1 g/dL (ref 31.5–35.7)
MCV: 91 fL (ref 79–97)
Platelets: 268 10*3/uL (ref 150–450)
RBC: 4.53 x10E6/uL (ref 4.14–5.80)
RDW: 11.9 % (ref 11.6–15.4)
WBC: 8 10*3/uL (ref 3.4–10.8)

## 2021-11-04 NOTE — Pre-Procedure Instructions (Signed)
Attempted to call patient regarding procedure instructions.  Left voice mail on the following.   ?Arrival time 1130 ?Nothing to eat or drink after midnight ?No meds AM of procedure ?Responsible person to drive you home and stay with you for 24 hrs ?Wash with special soap night before and morning of procedure ? ?

## 2021-11-05 ENCOUNTER — Encounter (HOSPITAL_COMMUNITY)
Admission: RE | Disposition: A | Discharge status: Home / Self Care | Payer: Self-pay | Source: Home / Self Care | Attending: Internal Medicine

## 2021-11-05 ENCOUNTER — Other Ambulatory Visit: Payer: Self-pay

## 2021-11-05 ENCOUNTER — Ambulatory Visit (HOSPITAL_COMMUNITY)
Admission: RE | Admit: 2021-11-05 | Discharge: 2021-11-05 | Disposition: A | Discharge status: Home / Self Care | Payer: Medicare Other | Attending: Internal Medicine | Admitting: Internal Medicine

## 2021-11-05 DIAGNOSIS — I495 Sick sinus syndrome: Secondary | ICD-10-CM | POA: Diagnosis not present

## 2021-11-05 DIAGNOSIS — E119 Type 2 diabetes mellitus without complications: Secondary | ICD-10-CM | POA: Diagnosis not present

## 2021-11-05 DIAGNOSIS — Z7982 Long term (current) use of aspirin: Secondary | ICD-10-CM | POA: Insufficient documentation

## 2021-11-05 DIAGNOSIS — I442 Atrioventricular block, complete: Secondary | ICD-10-CM | POA: Diagnosis not present

## 2021-11-05 DIAGNOSIS — E785 Hyperlipidemia, unspecified: Secondary | ICD-10-CM | POA: Diagnosis not present

## 2021-11-05 DIAGNOSIS — Z7984 Long term (current) use of oral hypoglycemic drugs: Secondary | ICD-10-CM | POA: Diagnosis not present

## 2021-11-05 DIAGNOSIS — I10 Essential (primary) hypertension: Secondary | ICD-10-CM | POA: Insufficient documentation

## 2021-11-05 DIAGNOSIS — Z45010 Encounter for checking and testing of cardiac pacemaker pulse generator [battery]: Secondary | ICD-10-CM | POA: Diagnosis not present

## 2021-11-05 DIAGNOSIS — Z79899 Other long term (current) drug therapy: Secondary | ICD-10-CM | POA: Diagnosis not present

## 2021-11-05 HISTORY — PX: PPM GENERATOR CHANGEOUT: EP1233

## 2021-11-05 LAB — GLUCOSE, CAPILLARY: Glucose-Capillary: 118 mg/dL — ABNORMAL HIGH (ref 70–99)

## 2021-11-05 SURGERY — PPM GENERATOR CHANGEOUT

## 2021-11-05 MED ORDER — CHLORHEXIDINE GLUCONATE 4 % EX LIQD
4.0000 "application " | Freq: Once | CUTANEOUS | Status: AC
  Administered 2021-11-05: 4 via TOPICAL
  Filled 2021-11-05: qty 60

## 2021-11-05 MED ORDER — SODIUM CHLORIDE 0.9 % IV SOLN: INTRAVENOUS | Status: DC

## 2021-11-05 MED ORDER — MIDAZOLAM HCL 5 MG/5ML IJ SOLN
INTRAMUSCULAR | Status: AC
  Filled 2021-11-05: qty 5

## 2021-11-05 MED ORDER — LIDOCAINE HCL (PF) 1 % IJ SOLN
INTRAMUSCULAR | Status: AC
  Filled 2021-11-05: qty 60

## 2021-11-05 MED ORDER — CEFAZOLIN SODIUM-DEXTROSE 2-4 GM/100ML-% IV SOLN
2.0000 g | INTRAVENOUS | Status: AC
  Administered 2021-11-05: 2 g via INTRAVENOUS
  Filled 2021-11-05: qty 100

## 2021-11-05 MED ORDER — FENTANYL CITRATE (PF) 100 MCG/2ML IJ SOLN
INTRAMUSCULAR | Status: AC
  Filled 2021-11-05: qty 2

## 2021-11-05 MED ORDER — FENTANYL CITRATE (PF) 100 MCG/2ML IJ SOLN
INTRAMUSCULAR | Status: DC | PRN
  Administered 2021-11-05 (×3): 25 ug via INTRAVENOUS

## 2021-11-05 MED ORDER — MIDAZOLAM HCL 5 MG/5ML IJ SOLN
INTRAMUSCULAR | Status: DC | PRN
  Administered 2021-11-05 (×4): 1 mg via INTRAVENOUS

## 2021-11-05 MED ORDER — LIDOCAINE HCL (PF) 1 % IJ SOLN
INTRAMUSCULAR | Status: DC | PRN
  Administered 2021-11-05: 60 mL

## 2021-11-05 MED ORDER — SODIUM CHLORIDE 0.9 % IV SOLN
INTRAVENOUS | Status: AC
  Filled 2021-11-05: qty 2

## 2021-11-05 MED ORDER — SODIUM CHLORIDE 0.9 % IV SOLN
80.0000 mg | INTRAVENOUS | Status: AC
  Administered 2021-11-05: 80 mg
  Filled 2021-11-05: qty 2

## 2021-11-05 MED ORDER — ACETAMINOPHEN 325 MG PO TABS
325.0000 mg | ORAL_TABLET | ORAL | Status: DC | PRN
  Filled 2021-11-05: qty 2

## 2021-11-05 MED ORDER — CEFAZOLIN SODIUM-DEXTROSE 2-4 GM/100ML-% IV SOLN
INTRAVENOUS | Status: AC
  Filled 2021-11-05: qty 100

## 2021-11-05 SURGICAL SUPPLY — 5 items
CABLE SURGICAL S-101-97-12 (CABLE) ×2 IMPLANT
IPG PACE AZUR XT DR MRI W1DR01 (Pacemaker) IMPLANT
PACE AZURE XT DR MRI W1DR01 (Pacemaker) ×2 IMPLANT
PAD DEFIB RADIO PHYSIO CONN (PAD) ×2 IMPLANT
TRAY PACEMAKER INSERTION (PACKS) ×2 IMPLANT

## 2021-11-05 NOTE — Interval H&P Note (Signed)
History and Physical Interval Note: ? ?11/05/2021 ?11:36 AM ? ?Nathan [name]  has presented today for surgery, with the diagnosis of eri.  The various methods of treatment have been discussed with the patient and family. After consideration of risks, benefits and other options for treatment, the patient has consented to  Procedure(s): ?PPM GENERATOR CHANGEOUT (N/A) as a surgical intervention.  The patient's history has been reviewed, patient examined, no change in status, stable for surgery.  I have reviewed the patient's chart and labs.  Questions were answered to the patient's satisfaction.   ? ? ?Virl Axe ? ? ?

## 2021-11-05 NOTE — Discharge Instructions (Signed)

## 2021-11-08 ENCOUNTER — Encounter (HOSPITAL_COMMUNITY): Payer: Self-pay | Admitting: Internal Medicine

## 2021-11-09 ENCOUNTER — Other Ambulatory Visit: Payer: Self-pay | Admitting: Internal Medicine

## 2021-11-10 ENCOUNTER — Other Ambulatory Visit: Payer: Self-pay | Admitting: Internal Medicine

## 2021-11-11 MED ORDER — CARVEDILOL 12.5 MG PO TABS: 12.5000 mg | ORAL_TABLET | Freq: Two times a day (BID) | ORAL | 3 refills | Status: DC

## 2021-11-17 ENCOUNTER — Ambulatory Visit (INDEPENDENT_AMBULATORY_CARE_PROVIDER_SITE_OTHER): Payer: Medicare Other

## 2021-11-17 DIAGNOSIS — I442 Atrioventricular block, complete: Secondary | ICD-10-CM

## 2021-11-17 LAB — CUP PACEART INCLINIC DEVICE CHECK
Date Time Interrogation Session: 20230419154625
Implantable Lead Implant Date: 20100125
Implantable Lead Implant Date: 20100125
Implantable Lead Location: 753859
Implantable Lead Location: 753860
Implantable Lead Model: 5076
Implantable Lead Model: 5076
Implantable Pulse Generator Implant Date: 20100125

## 2021-11-17 NOTE — Progress Notes (Signed)
Wound check appointment. Dermabond removed. Wound without redness or edema. Incision edges approximated, wound well healed. Normal device function. Thresholds, sensing, and impedances consistent with implant measurements. Device programmed at 3.5V/auto capture programmed on for extra safety margin until 3 month visit. Histogram distribution appropriate for patient and level of activity. No mode switches or high ventricular rates noted. Patient educated about wound care, arm mobility, lifting restrictions. ROV in 3 months with implanting physician. ?

## 2021-11-17 NOTE — Patient Instructions (Signed)
? ?  After Your Pacemaker   Monitor your pacemaker site for redness, swelling, and drainage. Call the device clinic at [phone] if you experience these symptoms or fever/chills.  Your incision was closed with Dermabond:  You may shower 1 day after your defibrillator implant and wash your incision with soap and water. Avoid lotions, ointments, or perfumes over your incision until it is well-healed.  You may use a hot tub or a pool after your wound check appointment if the incision is completely closed.  You may drive, unless driving has been restricted by your healthcare providers.  Remote monitoring is used to monitor your pacemaker from home. This monitoring is scheduled every 91 days by our office. It allows us to keep an eye on the functioning of your device to ensure it is working properly. You will routinely see your Electrophysiologist annually (more often if necessary).  

## 2021-11-24 DIAGNOSIS — H0015 Chalazion left lower eyelid: Secondary | ICD-10-CM | POA: Diagnosis not present

## 2021-12-14 DIAGNOSIS — H0015 Chalazion left lower eyelid: Secondary | ICD-10-CM | POA: Diagnosis not present

## 2021-12-14 DIAGNOSIS — D23111 Other benign neoplasm of skin of right upper eyelid, including canthus: Secondary | ICD-10-CM | POA: Diagnosis not present

## 2022-02-10 ENCOUNTER — Encounter: Payer: Medicare Other | Admitting: Internal Medicine

## 2022-02-23 ENCOUNTER — Encounter: Payer: Self-pay | Admitting: Internal Medicine

## 2022-02-23 ENCOUNTER — Ambulatory Visit (INDEPENDENT_AMBULATORY_CARE_PROVIDER_SITE_OTHER): Payer: Medicare Other | Admitting: Internal Medicine

## 2022-02-23 VITALS — BP 116/62 | HR 63 | Ht 72.0 in | Wt 186.0 lb

## 2022-02-23 DIAGNOSIS — Z950 Presence of cardiac pacemaker: Secondary | ICD-10-CM | POA: Diagnosis not present

## 2022-02-23 DIAGNOSIS — I4729 Other ventricular tachycardia: Secondary | ICD-10-CM

## 2022-02-23 DIAGNOSIS — I442 Atrioventricular block, complete: Secondary | ICD-10-CM | POA: Diagnosis not present

## 2022-02-23 NOTE — Patient Instructions (Signed)
Medication Instructions:  Your physician recommends that you continue on your current medications as directed. Please refer to the Current Medication list given to you today.  *If you need a refill on your cardiac medications before your next appointment, please call your pharmacy*   Lab Work: None ordered.  If you have labs (blood work) drawn today and your tests are completely normal, you will receive your results only by: [place] (if you have MyChart) OR A paper copy in the mail If you have any lab test that is abnormal or we need to change your treatment, we will call you to review the results.   Testing/Procedures: None ordered.    Follow-Up: At [facility], you and your health needs are our priority.  As part of our continuing mission to provide you with exceptional heart care, we have created designated Provider Care Teams.  These Care Teams include your primary Cardiologist (physician) and Advanced Practice Providers (APPs -  Physician Assistants and Nurse Practitioners) who all work together to provide you with the care you need, when you need it.  We recommend signing up for the patient portal called "MyChart".  Sign up information is provided on this After Visit Summary.  MyChart is used to connect with patients for Virtual Visits (Telemedicine).  Patients are able to view lab/test results, encounter notes, upcoming appointments, etc.  Non-urgent messages can be sent to your provider as well.   To learn more about what you can do with MyChart, go to [email or web address].    Your next appointment:   9 months with Dr Caryl Comes  Important Information About Sugar

## 2022-02-23 NOTE — Progress Notes (Signed)
Patient Care Team: Altheimer, Legrand Como, MD as PCP - General (Endocrinology)   HPI  Nathan [name] is a 81 y.o. male seen in followup for pacemaker implantation Medtronic  for intermittent complete heart block undertaken in January 2010.  GEN change 4/23  Hx of hypertension with labile BP   Feeling much better following device generator replacement  The patient denies chest pain, shortness of breath, nocturnal dyspnea, orthopnea or peripheral edema.  There have been no palpitations, lightheadedness or syncope.       DATE TEST EF   3/17 Echo   60-65 %   4/21 Echo  >75% AS mild  Grad-m 13     Date Cr K Hgb HgbA1c  10/18 0.7 4.1    6/19 0.73 4.7    12/20 0.67 4.7  6.6  1/23 0.75 3.7 15.7        Past Medical History:  Diagnosis Date   Atrioventricular block, 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   Qualifier: Diagnosis of  By: [name], MD, [name]     DM (diabetes mellitus) ([place])    HTN (hypertension)    Hyperlipidemia    Nephrolithiasis    Pacemaker-Medtronic 05/10/2011   Medtronic device implanted January 2010    Vertigo     Past Surgical History:  Procedure Laterality Date   APPENDECTOMY     PACEMAKER PLACEMENT     PPM GENERATOR CHANGEOUT N/A 11/05/2021   Procedure: PPM GENERATOR CHANGEOUT;  Surgeon: Deboraha Sprang, MD;  Location: [place] [facility];  Service: Cardiovascular;  Laterality: N/A;    Current Outpatient Medications  Medication Sig Dispense Refill   aspirin 81 MG tablet Take 81 mg by mouth daily.     atorvastatin (LIPITOR) 40 MG tablet Take 1 tablet by mouth daily.     carvedilol (COREG) 12.5 MG tablet Take 1 tablet (12.5 mg total) by mouth 2 (two) times daily. Take am dose if systolic BP is 628 or greater. 180 tablet 3   lisinopril (ZESTRIL) 5 MG tablet Take 1 tablet (5 mg total) by mouth daily. 90 tablet 3   metFORMIN (GLUCOPHAGE) 1000 MG tablet Take 1,000 mg by mouth 2 (two) times daily with a meal.     nateglinide (STARLIX) 120 MG tablet  Take 120 mg by mouth 3 (three) times daily with meals.     ZETIA 10 MG tablet Take 10 mg by mouth daily.      No current facility-administered medications for this visit.    Allergies  Allergen Reactions   Cyclobenzaprine Other (See Comments)    Unknown   Niacin Other (See Comments)    REACTION: flushing   Niacin And Related Other (See Comments)    Eye lids itch/ symptoms of poison ivy   Olmesartan Swelling    Prior Benicar in 2005-2011; stopped due to low BP   Pioglitazone Other (See Comments)    Achilles pain; ocular symptoms (twice)   Sulfonamide Derivatives Other (See Comments)    Unknown reaction to medication   Valsartan Other (See Comments)    Started in 2011; 80 mg daily; he stopped in 7/15 due to dizziness despite dose reduction    Review of Systems negative except from HPI and PMH  Physical Exam BP 116/62   Pulse 63   Ht 6' (1.829 m)   Wt 186 lb (84.4 kg)   SpO2 99%   BMI 25.23 kg/m   Well developed and well nourished in no acute distress HENT normal Neck supple with JVP-flat Carotid upstroke  brisk Clear Device pocket well healed; without hematoma or erythema.  There is no tethering  Regular rate and rhythm, no  gallop 2/6 right upper sternal border murmur Abd-soft with active BS No Clubbing cyanosis  edema Skin-warm and dry A & Oriented  Grossly normal sensory and motor function  ECG AV pacing at 64  19/14/43  Assessment and  Plan  Complete heart block   Pacemaker-Medtronic  T   Hypertension  Labile   VT nonsustained    No interval ventricular tachycardia  Blood pressure well controlled, continue lisinopril 5 and carvedilol 12.5  Device function is normal. Programming changes  See Paceart for details

## 2022-02-24 LAB — CUP PACEART INCLINIC DEVICE CHECK
Battery Remaining Longevity: 141 mo
Battery Voltage: 3.18 V
Brady Statistic AP VP Percent: 35.77 %
Brady Statistic AP VS Percent: 0.05 %
Brady Statistic AS VP Percent: 62.84 %
Brady Statistic AS VS Percent: 1.35 %
Brady Statistic RA Percent Paced: 36.47 %
Brady Statistic RV Percent Paced: 98.61 %
Date Time Interrogation Session: 20230726144700
Implantable Lead Implant Date: 20100125
Implantable Lead Implant Date: 20100125
Implantable Lead Location: 753859
Implantable Lead Location: 753860
Implantable Lead Model: 5076
Implantable Lead Model: 5076
Implantable Pulse Generator Implant Date: 20230407
Lead Channel Impedance Value: 323 Ohm
Lead Channel Impedance Value: 380 Ohm
Lead Channel Impedance Value: 475 Ohm
Lead Channel Impedance Value: 532 Ohm
Lead Channel Pacing Threshold Amplitude: 0.5 V
Lead Channel Pacing Threshold Amplitude: 0.75 V
Lead Channel Pacing Threshold Pulse Width: 0.4 ms
Lead Channel Pacing Threshold Pulse Width: 0.4 ms
Lead Channel Sensing Intrinsic Amplitude: 1 mV
Lead Channel Sensing Intrinsic Amplitude: 2.625 mV
Lead Channel Setting Pacing Amplitude: 1.5 V
Lead Channel Setting Pacing Amplitude: 2 V
Lead Channel Setting Pacing Pulse Width: 0.4 ms
Lead Channel Setting Sensing Sensitivity: 2 mV

## 2022-04-18 DIAGNOSIS — E119 Type 2 diabetes mellitus without complications: Secondary | ICD-10-CM | POA: Diagnosis not present

## 2022-04-18 DIAGNOSIS — Z7984 Long term (current) use of oral hypoglycemic drugs: Secondary | ICD-10-CM | POA: Diagnosis not present

## 2022-05-16 DIAGNOSIS — H1032 Unspecified acute conjunctivitis, left eye: Secondary | ICD-10-CM | POA: Diagnosis not present

## 2022-05-16 DIAGNOSIS — H00014 Hordeolum externum left upper eyelid: Secondary | ICD-10-CM | POA: Diagnosis not present

## 2022-05-20 DIAGNOSIS — H0014 Chalazion left upper eyelid: Secondary | ICD-10-CM | POA: Diagnosis not present

## 2022-07-11 DIAGNOSIS — E119 Type 2 diabetes mellitus without complications: Secondary | ICD-10-CM | POA: Diagnosis not present

## 2022-07-11 DIAGNOSIS — H2513 Age-related nuclear cataract, bilateral: Secondary | ICD-10-CM | POA: Diagnosis not present

## 2022-07-11 DIAGNOSIS — H0014 Chalazion left upper eyelid: Secondary | ICD-10-CM | POA: Diagnosis not present

## 2022-07-11 DIAGNOSIS — H353121 Nonexudative age-related macular degeneration, left eye, early dry stage: Secondary | ICD-10-CM | POA: Diagnosis not present

## 2022-07-11 DIAGNOSIS — D485 Neoplasm of uncertain behavior of skin: Secondary | ICD-10-CM | POA: Diagnosis not present

## 2022-09-14 DIAGNOSIS — R42 Dizziness and giddiness: Secondary | ICD-10-CM | POA: Diagnosis not present

## 2022-09-14 DIAGNOSIS — H9313 Tinnitus, bilateral: Secondary | ICD-10-CM | POA: Diagnosis not present

## 2022-09-16 DIAGNOSIS — H903 Sensorineural hearing loss, bilateral: Secondary | ICD-10-CM | POA: Diagnosis not present

## 2022-09-16 DIAGNOSIS — H9313 Tinnitus, bilateral: Secondary | ICD-10-CM | POA: Diagnosis not present

## 2022-11-04 ENCOUNTER — Other Ambulatory Visit: Payer: Self-pay | Admitting: Internal Medicine

## 2022-11-08 ENCOUNTER — Ambulatory Visit (INDEPENDENT_AMBULATORY_CARE_PROVIDER_SITE_OTHER): Payer: Medicare Other | Admitting: Family Medicine

## 2022-11-08 ENCOUNTER — Encounter: Payer: Self-pay | Admitting: Family Medicine

## 2022-11-08 VITALS — BP 120/58 | HR 85 | Temp 97.9°F | Ht 71.25 in | Wt 186.6 lb

## 2022-11-08 DIAGNOSIS — H8111 Benign paroxysmal vertigo, right ear: Secondary | ICD-10-CM

## 2022-11-08 DIAGNOSIS — E782 Mixed hyperlipidemia: Secondary | ICD-10-CM

## 2022-11-08 DIAGNOSIS — E119 Type 2 diabetes mellitus without complications: Secondary | ICD-10-CM | POA: Insufficient documentation

## 2022-11-08 LAB — POCT GLYCOSYLATED HEMOGLOBIN (HGB A1C): Hemoglobin A1C: 7.2 % — AB (ref 4.0–5.6)

## 2022-11-08 NOTE — Assessment & Plan Note (Signed)
A1C performed in office today and is slightly higher at 7.2, pt reports it is mostly diet related and he reports he will reduce sugar/starches in his diet. RTC in 6 months. I have ordered CMP and microalbumin testing today. Continue metformin 1000 mg BID and nateglinide 120 mg TID

## 2022-11-08 NOTE — Progress Notes (Signed)
New Patient Office Visit  Subjective    Patient ID: Nathan [name], male    DOB: Apr 14, 1941  Age: 82 y.o. MRN: [id number]  CC:  Chief Complaint  Patient presents with   Establish Care    HPI Nathan [name] presents to establish care Pt was being seen by Nathan [name] Nathan [name] for his healthcare. Was seeing an endocrinologist who had retired. States that he was told his A1C was good so he said he didn't need a specialist. States that he is doing well overall. No new symptoms or issues to report.  I reviewed his last note with the endocrinologist as well as his cardiologist note. Pt reports he is not having any shortness of breath of chest pain. He reports his last eye exam was about 4 months ago, was told his eyes were fine. Pt states he had a foot exam at his last endo visit. States he does have a little neuropathy in his feet but overall it's not bothering him very much-- more numb than painful.   Pt reports he sees a Dr. Lorrine [name] for a nerve impingement in his neck, states that he is starting to have more pain in his neck and is going to be getting an injection soon.   Reviewed his health maintenance and his family history, father died of colon cancer but he was told by the GI doctor that he would not have to have any more colonoscopies.  Outpatient Encounter Medications as of 11/08/2022  Medication Sig   aspirin 81 MG tablet Take 81 mg by mouth daily.   atorvastatin (LIPITOR) 40 MG tablet Take 1 tablet by mouth daily.   carvedilol (COREG) 12.5 MG tablet TAKE 1 TABLET BY MOUTH TWO TIMES DAILY, TAKE MORNING DOSE IF SYSTOLIC BLOOD PRESSURE IS 110 OR HIGHER   lisinopril (ZESTRIL) 5 MG tablet Take 1 tablet (5 mg total) by mouth daily.   metFORMIN (GLUCOPHAGE) 1000 MG tablet Take 1,000 mg by mouth 2 (two) times daily with a meal.   nateglinide (STARLIX) 120 MG tablet Take 120 mg by mouth 3 (three) times daily with meals.   ZETIA 10 MG tablet Take 10 mg by mouth daily.    No facility-administered  encounter medications on file as of 11/08/2022.    Past Medical History:  Diagnosis Date   Atrioventricular block, 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   Qualifier: Diagnosis of  By: Nathan Poche, MD, Nathan [name]     DM (diabetes mellitus)    HTN (hypertension)    Hyperlipidemia    Nephrolithiasis    Pacemaker-Medtronic 05/10/2011   Medtronic device implanted January 2010    Vertigo     Past Surgical History:  Procedure Laterality Date   APPENDECTOMY     PACEMAKER PLACEMENT     PPM GENERATOR CHANGEOUT N/A 11/05/2021   Procedure: PPM GENERATOR CHANGEOUT;  Surgeon: Nathan Salvia, MD;  [name];  Service: Cardiovascular;  Laterality: N/A;    Family History  Problem Relation Age of Onset   Cancer - Colon Father     Social History   Socioeconomic History   Marital status: Married    Spouse name: Not on file   Number of children: Not on file   Years of education: Not on file   Highest education level: Not on file  Occupational History   Not on file  Tobacco Use   Smoking status: Never   Smokeless tobacco: Never  Vaping Use   Vaping Use: Never used  Substance and Sexual  Activity   Alcohol use: Not Currently    Comment: occasional   Drug use: No   Sexual activity: Not Currently  Other Topics Concern   Not on file  Social History Narrative   Not on file   Social Determinants of Health   Financial Resource Strain: Not on file  Food Insecurity: Not on file  Transportation Needs: Not on file  Physical Activity: Not on file  Stress: Not on file  Social Connections: Not on file  Intimate Partner Violence: Not on file    Review of Systems  All other systems reviewed and are negative.       Objective    BP (!) 120/58 (BP Location: Left Arm, Patient Position: Sitting, Cuff Size: Normal)   Pulse 85   Temp 97.9 F (36.6 C) (Oral)   Ht 5' 11.25" (1.81 Nathan [name])   Wt 186 lb 9.6 oz (84.6 kg)   SpO2 95%   BMI 25.84 kg/Nathan [name]   Physical Exam Vitals reviewed.   Constitutional:      Appearance: Normal appearance. He is well-groomed and normal weight.  Eyes:     Extraocular Movements: Extraocular movements intact.     Conjunctiva/sclera: Conjunctivae normal.  Neck:     Thyroid: No thyromegaly.  Cardiovascular:     Rate and Rhythm: Normal rate and regular rhythm.     Heart sounds: S1 normal and S2 normal. No murmur heard. Pulmonary:     Effort: Pulmonary effort is normal.     Breath sounds: Normal breath sounds and air entry. No rales.  Abdominal:     General: Abdomen is flat. Bowel sounds are normal.  Musculoskeletal:     Right lower leg: No edema.     Left lower leg: No edema.  Neurological:     General: No focal deficit present.     Mental Status: He is alert and oriented to person, place, and time.     Gait: Gait is intact.  Psychiatric:        Mood and Affect: Mood and affect normal.    Lab Results  Component Value Date   HGBA1C 7.2 (A) 11/08/2022       Assessment & Plan:   Problem List Items Addressed This Visit       Unprioritized   Diabetes mellitus without complication - Primary    A1C performed in office today and is slightly higher at 7.2, pt reports it is mostly diet related and he reports he will reduce sugar/starches in his diet. RTC in 6 months. I have ordered CMP and microalbumin testing today. Continue metformin 1000 mg BID and nateglinide 120 mg TID      Relevant Orders   CMP   Microalbumin/Creatinine Ratio, Urine   POC HgB A1c (Completed)   Mixed hyperlipidemia    Reviewed last lipid panel from 2022, he is due for new labs, continue atorvastatin 40 mg daily and zetia 10 mg daily.       Relevant Orders   Lipid Panel   BPPV (benign paroxysmal positional vertigo), right    Pt sustained this after a fall and hitting his head a year or so ago, states that his last episode lasted 2 months. I gave him handouts on the Epley maneuvers he can start doing at home in case he gets another episode of vertigo.        Return in about 6 months (around 05/10/2023) for DM.   Karie Georges, MD

## 2022-11-08 NOTE — Assessment & Plan Note (Signed)
Reviewed last lipid panel from 2022, he is due for new labs, continue atorvastatin 40 mg daily and zetia 10 mg daily.

## 2022-11-08 NOTE — Assessment & Plan Note (Signed)
Pt sustained this after a fall and hitting his head a year or so ago, states that his last episode lasted 2 months. I gave him handouts on the Epley maneuvers he can start doing at home in case he gets another episode of vertigo.

## 2022-11-11 ENCOUNTER — Other Ambulatory Visit (INDEPENDENT_AMBULATORY_CARE_PROVIDER_SITE_OTHER): Payer: Medicare Other

## 2022-11-11 DIAGNOSIS — E782 Mixed hyperlipidemia: Secondary | ICD-10-CM

## 2022-11-11 DIAGNOSIS — E119 Type 2 diabetes mellitus without complications: Secondary | ICD-10-CM

## 2022-11-11 LAB — COMPREHENSIVE METABOLIC PANEL
ALT: 20 U/L (ref 0–53)
AST: 16 U/L (ref 0–37)
Albumin: 4.1 g/dL (ref 3.5–5.2)
Alkaline Phosphatase: 96 U/L (ref 39–117)
BUN: 14 mg/dL (ref 6–23)
CO2: 29 mEq/L (ref 19–32)
Calcium: 9.2 mg/dL (ref 8.4–10.5)
Chloride: 100 mEq/L (ref 96–112)
Creatinine, Ser: 0.84 mg/dL (ref 0.40–1.50)
GFR: 82.32 mL/min (ref >60.00)
Glucose, Bld: 133 mg/dL — ABNORMAL HIGH (ref 70–99)
Potassium: 4.1 mEq/L (ref 3.5–5.1)
Sodium: 137 mEq/L (ref 135–145)
Total Bilirubin: 0.9 mg/dL (ref 0.2–1.2)
Total Protein: 6.7 g/dL (ref 6.0–8.3)

## 2022-11-11 LAB — MICROALBUMIN / CREATININE URINE RATIO
Creatinine,U: 67.6 mg/dL
Microalb Creat Ratio: 1 mg/g (ref 0.0–30.0)
Microalb, Ur: <0.7 mg/dL (ref 0.0–1.9)

## 2022-11-11 LAB — LIPID PANEL
Cholesterol: 101 mg/dL (ref 0–200)
HDL: 32.3 mg/dL — ABNORMAL LOW (ref >39.00)
LDL Cholesterol: 53 mg/dL (ref 0–99)
NonHDL: 69.16
Total CHOL/HDL Ratio: 3
Triglycerides: 79 mg/dL (ref 0.0–149.0)
VLDL: 15.8 mg/dL (ref 0.0–40.0)

## 2022-12-15 ENCOUNTER — Telehealth: Payer: Self-pay | Admitting: Family Medicine

## 2022-12-15 NOTE — Telephone Encounter (Signed)
Contacted Nathan [name] to schedule their annual wellness visit. Appointment made for 12/23/22.  Nathan [name] AWV direct phone # [phone]

## 2022-12-20 DIAGNOSIS — M5412 Radiculopathy, cervical region: Secondary | ICD-10-CM | POA: Diagnosis not present

## 2022-12-23 ENCOUNTER — Ambulatory Visit (INDEPENDENT_AMBULATORY_CARE_PROVIDER_SITE_OTHER): Payer: Medicare Other

## 2022-12-23 VITALS — BP 124/60 | HR 73 | Temp 98.5°F | Ht 72.0 in | Wt 188.5 lb

## 2022-12-23 DIAGNOSIS — Z0000 Encounter for general adult medical examination without abnormal findings: Secondary | ICD-10-CM

## 2022-12-23 NOTE — Patient Instructions (Addendum)
Nathan [name] , Thank you for taking time to come for your Medicare Wellness Visit. I appreciate your ongoing commitment to your health goals. Please review the following plan we discussed and let me know if I can assist you in the future.   These are the goals we discussed:  Goals       Stay Alive! (pt-stated)        This is a list of the screening recommended for you and due dates:  Health Maintenance  Topic Date Due   Complete foot exam   Never done   DTaP/Tdap/Td vaccine (1 - Tdap) Never done   Eye exam for diabetics  12/23/2022*   COVID-19 Vaccine (1) 01/08/2023*   Zoster (Shingles) Vaccine (1 of 2) 05/10/2023*   Pneumonia Vaccine (1 of 1 - PCV) 11/08/2023*   Flu Shot  03/02/2023   Hemoglobin A1C  05/10/2023   Yearly kidney function blood test for diabetes  11/11/2023   Yearly kidney health urinalysis for diabetes  11/11/2023   Medicare Annual Wellness Visit  12/23/2023   HPV Vaccine  Aged Out  *Topic was postponed. The date shown is not the original due date.    Advanced directives: Advance directive discussed with you today. Even though you declined this today, please call our office should you change your mind, and we can give you the proper paperwork for you to fill out.   Conditions/risks identified: None  Next appointment: Follow up in one year for your annual wellness visit.    Preventive Care 81 Years and Older, Male  Preventive care refers to lifestyle choices and visits with your health care provider that can promote health and wellness. What does preventive care include? A yearly physical exam. This is also called an annual well check. Dental exams once or twice a year. Routine eye exams. Ask your health care provider how often you should have your eyes checked. Personal lifestyle choices, including: Daily care of your teeth and gums. Regular physical activity. Eating a healthy diet. Avoiding tobacco and drug use. Limiting alcohol use. Practicing safe  sex. Taking low doses of aspirin every day. Taking vitamin and mineral supplements as recommended by your health care provider. What happens during an annual well check? The services and screenings done by your health care provider during your annual well check will depend on your age, overall health, lifestyle risk factors, and family history of disease. Counseling  Your health care provider may ask you questions about your: Alcohol use. Tobacco use. Drug use. Emotional well-being. Home and relationship well-being. Sexual activity. Eating habits. History of falls. Memory and ability to understand (cognition). Work and work Astronomer. Screening  You may have the following tests or measurements: Height, weight, and BMI. Blood pressure. Lipid and cholesterol levels. These may be checked every 5 years, or more frequently if you are over 43 years old. Skin check. Lung cancer screening. You may have this screening every year starting at age 40 if you have a 30-pack-year history of smoking and currently smoke or have quit within the past 15 years. Fecal occult blood test (FOBT) of the stool. You may have this test every year starting at age 26. Flexible sigmoidoscopy or colonoscopy. You may have a sigmoidoscopy every 5 years or a colonoscopy every 10 years starting at age 75. Prostate cancer screening. Recommendations will vary depending on your family history and other risks. Hepatitis C blood test. Hepatitis B blood test. Sexually transmitted disease (STD) testing. Diabetes screening. This is done  by checking your blood sugar (glucose) after you have not eaten for a while (fasting). You may have this done every 1-3 years. Abdominal aortic aneurysm (AAA) screening. You may need this if you are a current or former smoker. Osteoporosis. You may be screened starting at age 39 if you are at high risk. Talk with your health care provider about your test results, treatment options, and if  necessary, the need for more tests. Vaccines  Your health care provider may recommend certain vaccines, such as: Influenza vaccine. This is recommended every year. Tetanus, diphtheria, and acellular pertussis (Tdap, Td) vaccine. You may need a Td booster every 10 years. Zoster vaccine. You may need this after age 64. Pneumococcal 13-valent conjugate (PCV13) vaccine. One dose is recommended after age 70. Pneumococcal polysaccharide (PPSV23) vaccine. One dose is recommended after age 59. Talk to your health care provider about which screenings and vaccines you need and how often you need them. This information is not intended to replace advice given to you by your health care provider. Make sure you discuss any questions you have with your health care provider. Document Released: 08/14/2015 Document Revised: 04/06/2016 Document Reviewed: 05/19/2015 Elsevier Interactive Patient Education  2017 [employer].  Fall Prevention in the Home Falls can cause injuries. They can happen to people of all ages. There are many things you can do to make your home safe and to help prevent falls. What can I do on the outside of my home? Regularly fix the edges of walkways and driveways and fix any cracks. Remove anything that might make you trip as you walk through a door, such as a raised step or threshold. Trim any bushes or trees on the path to your home. Use bright outdoor lighting. Clear any walking paths of anything that might make someone trip, such as rocks or tools. Regularly check to see if handrails are loose or broken. Make sure that both sides of any steps have handrails. Any raised decks and porches should have guardrails on the edges. Have any leaves, snow, or ice cleared regularly. Use sand or salt on walking paths during winter. Clean up any spills in your garage right away. This includes oil or grease spills. What can I do in the bathroom? Use night lights. Install grab bars by the toilet  and in the tub and shower. Do not use towel bars as grab bars. Use non-skid mats or decals in the tub or shower. If you need to sit down in the shower, use a plastic, non-slip stool. Keep the floor dry. Clean up any water that spills on the floor as soon as it happens. Remove soap buildup in the tub or shower regularly. Attach bath mats securely with double-sided non-slip rug tape. Do not have throw rugs and other things on the floor that can make you trip. What can I do in the bedroom? Use night lights. Make sure that you have a light by your bed that is easy to reach. Do not use any sheets or blankets that are too big for your bed. They should not hang down onto the floor. Have a firm chair that has side arms. You can use this for support while you get dressed. Do not have throw rugs and other things on the floor that can make you trip. What can I do in the kitchen? Clean up any spills right away. Avoid walking on wet floors. Keep items that you use a lot in easy-to-reach places. If you need to  reach something above you, use a strong step stool that has a grab bar. Keep electrical cords out of the way. Do not use floor polish or wax that makes floors slippery. If you must use wax, use non-skid floor wax. Do not have throw rugs and other things on the floor that can make you trip. What can I do with my stairs? Do not leave any items on the stairs. Make sure that there are handrails on both sides of the stairs and use them. Fix handrails that are broken or loose. Make sure that handrails are as long as the stairways. Check any carpeting to make sure that it is firmly attached to the stairs. Fix any carpet that is loose or worn. Avoid having throw rugs at the top or bottom of the stairs. If you do have throw rugs, attach them to the floor with carpet tape. Make sure that you have a light switch at the top of the stairs and the bottom of the stairs. If you do not have them, ask someone to add  them for you. What else can I do to help prevent falls? Wear shoes that: Do not have high heels. Have rubber bottoms. Are comfortable and fit you well. Are closed at the toe. Do not wear sandals. If you use a stepladder: Make sure that it is fully opened. Do not climb a closed stepladder. Make sure that both sides of the stepladder are locked into place. Ask someone to hold it for you, if possible. Clearly mark and make sure that you can see: Any grab bars or handrails. First and last steps. Where the edge of each step is. Use tools that help you move around (mobility aids) if they are needed. These include: Canes. Walkers. Scooters. Crutches. Turn on the lights when you go into a dark area. Replace any light bulbs as soon as they burn out. Set up your furniture so you have a clear path. Avoid moving your furniture around. If any of your floors are uneven, fix them. If there are any pets around you, be aware of where they are. Review your medicines with your doctor. Some medicines can make you feel dizzy. This can increase your chance of falling. Ask your doctor what other things that you can do to help prevent falls. This information is not intended to replace advice given to you by your health care provider. Make sure you discuss any questions you have with your health care provider. Document Released: 05/14/2009 Document Revised: 12/24/2015 Document Reviewed: 08/22/2014 Elsevier Interactive Patient Education  2017 [employer].

## 2022-12-23 NOTE — Progress Notes (Signed)
Subjective:   Nathan [name] is a 82 y.o. male who presents for Medicare Annual/Subsequent preventive examination.  Review of Systems      Cardiac Risk Factors include: advanced age (>70men, >56 women);diabetes mellitus;male gender     Objective:    Today's Vitals   12/23/22 0928  BP: 124/60  Pulse: 73  Temp: 98.5 F (36.9 C)  TempSrc: Oral  SpO2: 97%  Weight: 188 lb 8 oz (85.5 kg)  Height: 6' (1.829 m)   Body mass index is 25.57 kg/m.     12/23/2022    9:37 AM 11/05/2021   12:28 PM 08/03/2021    6:38 PM 05/18/2017    1:52 PM 04/09/2017    9:46 PM  Advanced Directives  Does Patient Have a Medical Advance Directive? No No No No;Yes No  Would patient like information on creating a medical advance directive? No - Patient declined No - Patient declined No - Patient declined      Current Medications (verified) Outpatient Encounter Medications as of 12/23/2022  Medication Sig   aspirin 81 MG tablet Take 81 mg by mouth daily.   atorvastatin (LIPITOR) 40 MG tablet Take 1 tablet by mouth daily.   carvedilol (COREG) 12.5 MG tablet TAKE 1 TABLET BY MOUTH TWO TIMES DAILY, TAKE MORNING DOSE IF SYSTOLIC BLOOD PRESSURE IS 110 OR HIGHER   lisinopril (ZESTRIL) 5 MG tablet Take 1 tablet (5 mg total) by mouth daily.   metFORMIN (GLUCOPHAGE) 1000 MG tablet Take 1,000 mg by mouth 2 (two) times daily with a meal.   nateglinide (STARLIX) 120 MG tablet Take 120 mg by mouth 3 (three) times daily with meals.   ZETIA 10 MG tablet Take 10 mg by mouth daily.    No facility-administered encounter medications on file as of 12/23/2022.    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   Qualifier: Diagnosis of  By: Antoine Poche, MD, Gerrit Heck     DM (diabetes mellitus) (HCC)    HTN (hypertension)     Hyperlipidemia    Nephrolithiasis    Pacemaker-Medtronic 05/10/2011   Medtronic device implanted January 2010    Vertigo    Past Surgical History:  Procedure Laterality Date   APPENDECTOMY     PACEMAKER PLACEMENT     PPM GENERATOR CHANGEOUT N/A 11/05/2021   Procedure: PPM GENERATOR CHANGEOUT;  Surgeon: Duke Salvia, MD;  Location: [facility] INVASIVE CV LAB;  Service: Cardiovascular;  Laterality: N/A;   Family History  Problem Relation Age of Onset   Cancer - Colon Father    Social History   Socioeconomic History   Marital status: Married    Spouse name: Not on file   Number of children: Not on file   Years of education: Not on file   Highest education level: Not on file  Occupational History   Not on file  Tobacco Use   Smoking status: Never   Smokeless tobacco: Never  Vaping Use   Vaping Use: Never used  Substance and Sexual Activity   Alcohol use: Not Currently    Comment: occasional   Drug use: No   Sexual activity: Not Currently  Other Topics Concern   Not on file  Social History Narrative   Not on file   Social Determinants of Health   Financial Resource Strain: Low Risk  (12/23/2022)   Overall Financial Resource Strain (CARDIA)  Difficulty of Paying Living Expenses: Not hard at all  Food Insecurity: No Food Insecurity (12/23/2022)   Hunger Vital Sign    Worried About Running Out of Food in the Last Year: Never true    Ran Out of Food in the Last Year: Never true  Transportation Needs: No Transportation Needs (12/23/2022)   PRAPARE - Administrator, Civil Service (Medical): No    Lack of Transportation (Non-Medical): No  Physical Activity: Inactive (12/23/2022)   Exercise Vital Sign    Days of Exercise per Week: 0 days    Minutes of Exercise per Session: 0 min  Stress: No Stress Concern Present (12/23/2022)   [employer] of Occupational Health - Occupational Stress Questionnaire    Feeling of Stress : Not at all  Social Connections: Moderately  Isolated (12/23/2022)   Social Connection and Isolation Panel [NHANES]    Frequency of Communication with Friends and Family: More than three times a week    Frequency of Social Gatherings with Friends and Family: More than three times a week    Attends Religious Services: Never    Database administrator or Organizations: No    Attends Engineer, structural: Never    Marital Status: Married    Tobacco Counseling Counseling given: Not Answered   Clinical Intake:  Pre-visit preparation completed: No  Pain : No/denies painNutrition Risk Assessment:  Has the patient had any N/V/D within the last 2 months?  No  Does the patient have any non-healing wounds?  No  Has the patient had any unintentional weight loss or weight gain?  No   Diabetes:  Is the patient diabetic?  Yes  If diabetic, was a CBG obtained today?  Yes CBG 96 Taken by patient Did the patient bring in their glucometer from home?  No  How often do you monitor your CBG's? 3-4 x daily.   Financial Strains and Diabetes Management:  Are you having any financial strains with the device, your supplies or your medication? No .  Does the patient want to be seen by Chronic Care Management for management of their diabetes?  No  Would the patient like to be referred to a Nutritionist or for Diabetic Management?  No   Diabetic Exams:  Diabetic Eye Exam: Completed . Overdue for diabetic eye exam. Pt has been advised about the importance in completing this exam. A referral has been placed today. Message sent to referral coordinator for scheduling purposes. Advised pt to expect a call from office referred to regarding appt.  Diabetic Foot Exam: Completed . Pt has been advised about the importance in completing this exam. Pt is scheduled for diabetic foot exam on Followed by PCP.       BMI - recorded: 25.57 Nutritional Status: BMI 25 -29 Overweight Nutritional Risks: None Diabetes: Yes CBG done?: Yes (CBG 96 Taken by  patient) CBG resulted in Enter/ Edit results?: Yes Did pt. bring in CBG monitor from home?: No  How often do you need to have someone help you when you read instructions, pamphlets, or other written materials from your doctor or pharmacy?: 1 - Never  Diabetic?  Yes  Interpreter Needed?: No  Information entered by :: Theresa Mulligan LPN   Activities of Daily Living    12/23/2022    9:36 AM  In your present state of health, do you have any difficulty performing the following activities:  Hearing? 0  Vision? 0  Difficulty concentrating or making decisions? 0  Walking or climbing stairs? 0  Dressing or bathing? 0  Doing errands, shopping? 0  Preparing Food and eating ? N  Using the Toilet? N  In the past six months, have you accidently leaked urine? N  Do you have problems with loss of bowel control? N  Managing your Medications? N  Managing your Finances? N  Housekeeping or managing your Housekeeping? N    Patient Care Team: Karie Georges, MD as PCP - General (Family Medicine)  Indicate any recent Medical Services you may have received from other than [facility] providers in the past year (date may be approximate).     Assessment:   This is a routine wellness examination for [place].  Hearing/Vision screen Hearing Screening - Comments:: Denies hearing difficulties   Vision Screening - Comments:: Wears rx glasses - up to date with routine eye exams with  Dr Honor Loh  Dietary issues and exercise activities discussed: Current Exercise Habits: The patient does not participate in regular exercise at present, Exercise limited by: None identified   Goals Addressed               This Visit's Progress     Stay Alive! (pt-stated)         Depression Screen    12/23/2022    9:35 AM 11/08/2022    2:21 PM  PHQ 2/9 Scores  PHQ - 2 Score 0 0    Fall Risk    12/23/2022    9:37 AM 11/08/2022    2:21 PM  Fall Risk   Falls in the past year? 0 0  Number falls in past yr: 0 0   Injury with Fall? 0 0  Risk for fall due to : No Fall Risks No Fall Risks  Follow up Falls prevention discussed Falls evaluation completed    FALL RISK PREVENTION PERTAINING TO THE HOME:  Any stairs in or around the home? Yes  If so, are there any without handrails? No  Home free of loose throw rugs in walkways, pet beds, electrical cords, etc? Yes  Adequate lighting in your home to reduce risk of falls? Yes   ASSISTIVE DEVICES UTILIZED TO PREVENT FALLS:  Life alert? No  Use of a cane, walker or w/c? No  Grab bars in the bathroom? No  Shower chair or bench in shower? Yes Elevated toilet seat or a handicapped toilet? Yes  TIMED UP AND GO:  Was the test performed? Yes .  Length of time to ambulate 10 feet: 10 sec.   Gait steady and fast with assistive device  Cognitive Function:        12/23/2022    9:37 AM  6CIT Screen  What Year? 0 points  What month? 0 points  What time? 0 points  Count back from 20 0 points  Months in reverse 0 points  Repeat phrase 0 points  Total Score 0 points    Immunizations  There is no immunization history on file for this patient.  TDAP status: Due, Education has been provided regarding the importance of this vaccine. Advised may receive this vaccine at local pharmacy or Health Dept. Aware to provide a copy of the vaccination record if obtained from local pharmacy or Health Dept. Verbalized acceptance and understanding.  Flu Vaccine status: Up to date  Pneumococcal vaccine status: Due, Education has been provided regarding the importance of this vaccine. Advised may receive this vaccine at local pharmacy or Health Dept. Aware to provide a copy of the vaccination record  if obtained from local pharmacy or Health Dept. Verbalized acceptance and understanding.  Covid-19 vaccine status: Declined, Education has been provided regarding the importance of this vaccine but patient still declined. Advised may receive this vaccine at local pharmacy  or Health Dept.or vaccine clinic. Aware to provide a copy of the vaccination record if obtained from local pharmacy or Health Dept. Verbalized acceptance and understanding.  Qualifies for Shingles Vaccine? Yes   Zostavax completed No   Shingrix Completed?: No.    Education has been provided regarding the importance of this vaccine. Patient has been advised to call insurance company to determine out of pocket expense if they have not yet received this vaccine. Advised may also receive vaccine at local pharmacy or Health Dept. Verbalized acceptance and understanding.  Screening Tests Health Maintenance  Topic Date Due   FOOT EXAM  Never done   DTaP/Tdap/Td (1 - Tdap) Never done   OPHTHALMOLOGY EXAM  12/23/2022 (Originally 04/15/1951)   COVID-19 Vaccine (1) 01/08/2023 (Originally 04/16/1946)   Zoster Vaccines- Shingrix (1 of 2) 05/10/2023 (Originally 04/15/1960)   Pneumonia Vaccine 20+ Years old (1 of 1 - PCV) 11/08/2023 (Originally 04/16/2007)   INFLUENZA VACCINE  03/02/2023   HEMOGLOBIN A1C  05/10/2023   Diabetic kidney evaluation - eGFR measurement  11/11/2023   Diabetic kidney evaluation - Urine ACR  11/11/2023   Medicare Annual Wellness (AWV)  12/23/2023   HPV VACCINES  Aged Out    Health Maintenance  Health Maintenance Due  Topic Date Due   FOOT EXAM  Never done   DTaP/Tdap/Td (1 - Tdap) Never done    Colorectal cancer screening: No longer required.   Lung Cancer Screening: (Low Dose CT Chest recommended if Age 22-80 years, 30 pack-year currently smoking OR have quit w/in 15years.) does not qualify.     Additional Screening:  Hepatitis C Screening: does not qualify; Completed    Vision Screening: Recommended annual ophthalmology exams for early detection of glaucoma and other disorders of the eye. Is the patient up to date with their annual eye exam?  Yes  Who is the provider or what is the name of the office in which the patient attends annual eye exams? Dr Honor Loh If pt  is not established with a provider, would they like to be referred to a provider to establish care? No .   Dental Screening: Recommended annual dental exams for proper oral hygiene  Community Resource Referral / Chronic Care Management:  CRR required this visit?  No   CCM required this visit?  No      Plan:     I have personally reviewed and noted the following in the patient's chart:   Medical and social history Use of alcohol, tobacco or illicit drugs  Current medications and supplements including opioid prescriptions. Patient is not currently taking opioid prescriptions. Functional ability and status Nutritional status Physical activity Advanced directives List of other physicians Hospitalizations, surgeries, and ER visits in previous 12 months Vitals Screenings to include cognitive, depression, and falls Referrals and appointments  In addition, I have reviewed and discussed with patient certain preventive protocols, quality metrics, and best practice recommendations. A written personalized care plan for preventive services as well as general preventive health recommendations were provided to patient.     Tillie Rung, LPN   [id number]   Nurse Notes: None

## 2023-01-26 ENCOUNTER — Telehealth: Payer: Self-pay | Admitting: Family Medicine

## 2023-01-26 NOTE — Telephone Encounter (Signed)
error 

## 2023-02-02 ENCOUNTER — Other Ambulatory Visit: Payer: Self-pay | Admitting: Internal Medicine

## 2023-02-06 ENCOUNTER — Other Ambulatory Visit: Payer: Self-pay | Admitting: *Deleted

## 2023-02-06 MED ORDER — ACCU-CHEK FASTCLIX LANCETS MISC: 1 refills | Status: DC

## 2023-02-06 NOTE — Telephone Encounter (Signed)
Rx done. 

## 2023-02-15 ENCOUNTER — Ambulatory Visit: Payer: Medicare Other | Attending: Internal Medicine | Admitting: Internal Medicine

## 2023-02-15 ENCOUNTER — Encounter: Payer: Self-pay | Admitting: Internal Medicine

## 2023-02-15 VITALS — BP 126/76 | HR 68 | Ht 72.0 in | Wt 185.4 lb

## 2023-02-15 DIAGNOSIS — I4729 Other ventricular tachycardia: Secondary | ICD-10-CM | POA: Insufficient documentation

## 2023-02-15 DIAGNOSIS — I350 Nonrheumatic aortic (valve) stenosis: Secondary | ICD-10-CM | POA: Insufficient documentation

## 2023-02-15 DIAGNOSIS — Z950 Presence of cardiac pacemaker: Secondary | ICD-10-CM | POA: Insufficient documentation

## 2023-02-15 DIAGNOSIS — R002 Palpitations: Secondary | ICD-10-CM | POA: Insufficient documentation

## 2023-02-15 DIAGNOSIS — I442 Atrioventricular block, complete: Secondary | ICD-10-CM | POA: Diagnosis not present

## 2023-02-15 NOTE — Patient Instructions (Signed)
Medication Instructions:  Your physician recommends that you continue on your current medications as directed. Please refer to the Current Medication list given to you today.  *If you need a refill on your cardiac medications before your next appointment, please call your pharmacy*   Lab Work: None ordered.  If you have labs (blood work) drawn today and your tests are completely normal, you will receive your results only by: MyChart Message (if you have MyChart) OR A paper copy in the mail If you have any lab test that is abnormal or we need to change your treatment, we will call you to review the results.   Testing/Procedures: Your physician has requested that you have an echocardiogram. Echocardiography is a painless test that uses sound waves to create images of your heart. It provides your doctor with information about the size and shape of your heart and how well your heart's chambers and valves are working. This procedure takes approximately one hour. There are no restrictions for this procedure. Please do NOT wear cologne, perfume, aftershave, or lotions (deodorant is allowed). Please arrive 15 minutes prior to your appointment time.     Follow-Up: At [facility], you and your health needs are our priority.  As part of our continuing mission to provide you with exceptional heart care, we have created designated Provider Care Teams.  These Care Teams include your primary Cardiologist (physician) and Advanced Practice Providers (APPs -  Physician Assistants and Nurse Practitioners) who all work together to provide you with the care you need, when you need it.  We recommend signing up for the patient portal called "MyChart".  Sign up information is provided on this After Visit Summary.  MyChart is used to connect with patients for Virtual Visits (Telemedicine).  Patients are able to view lab/test results, encounter notes, upcoming appointments, etc.  Non-urgent messages can  be sent to your provider as well.   To learn more about what you can do with MyChart, go to [email or web address].    Your next appointment:   12 months with Dr Klein 

## 2023-02-15 NOTE — Progress Notes (Signed)
Patient Care Team: Karie Georges, MD as PCP - General (Family Medicine)   HPI  Nathan [name] is a 82 y.o. male seen in followup for pacemaker implantation Medtronic  for intermittent complete heart block undertaken in January 2010.  GEN change 4/23  Hx of hypertension with labile BP  The patient denies chest pain, shortness of breath, nocturnal dyspnea, orthopnea or peripheral edema.  There have been no palpitations, lightheadedness or syncope  Can no longer run a 6-minute mile.      DATE TEST EF   3/17 Echo   60-65 %   4/21 Echo  >75% AS mild  Grad-m 13     Date Cr K Hgb HgbA1c  10/18 0.7 4.1    6/19 0.73 4.7    12/20 0.67 4.7  6.6  1/23 0.75 3.7 15.7   4/24 0.84 4.1  7.2       Past Medical History:  Diagnosis Date   Atrioventricular block, 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   Qualifier: Diagnosis of  By: Antoine Poche, MD, Gerrit Heck     DM (diabetes mellitus) (HCC)    HTN (hypertension)    Hyperlipidemia    Nephrolithiasis    Pacemaker-Medtronic 05/10/2011   Medtronic device implanted January 2010    Vertigo     Past Surgical History:  Procedure Laterality Date   APPENDECTOMY     PACEMAKER PLACEMENT     PPM GENERATOR CHANGEOUT N/A 11/05/2021   Procedure: PPM GENERATOR CHANGEOUT;  Surgeon: Duke Salvia, MD;  Location: [facility] INVASIVE CV LAB;  Service: Cardiovascular;  Laterality: N/A;    Current Outpatient Medications  Medication Sig Dispense Refill   Accu-Chek FastClix Lancets MISC Use as directed once a day 102 each 1   aspirin 81 MG tablet Take 81 mg by mouth daily.     atorvastatin (LIPITOR) 40 MG tablet Take 1 tablet by mouth daily.     carvedilol (COREG) 12.5 MG tablet TAKE 1 TABLET BY MOUTH 2 TIMES A DAY *TAKE MORNING DOSE IF SYSTOLIC BLOOD PRESSURE IS 110 OR HIGHER 180 tablet 0   lisinopril (ZESTRIL) 5 MG tablet Take 1 tablet (5 mg total) by mouth daily. 90 tablet 3   metFORMIN (GLUCOPHAGE) 1000 MG tablet Take 1,000 mg by mouth 2 (two) times  daily with a meal.     nateglinide (STARLIX) 120 MG tablet Take 120 mg by mouth 3 (three) times daily with meals.     ZETIA 10 MG tablet Take 10 mg by mouth daily.      No current facility-administered medications for this visit.    Allergies  Allergen Reactions   Cyclobenzaprine Other (See Comments)    Unknown   Niacin Other (See Comments)    REACTION: flushing   Niacin And Related Other (See Comments)    Eye lids itch/ symptoms of poison ivy   Olmesartan Swelling    Prior Benicar in 2005-2011; stopped due to low BP   Pioglitazone Other (See Comments)    Achilles pain; ocular symptoms (twice)   Sulfonamide Derivatives Other (See Comments)    Unknown reaction to medication   Valsartan Other (See Comments)    Started in 2011; 80 mg daily; he stopped in 7/15 due to dizziness despite dose reduction    Review of Systems negative except from HPI and PMH  Physical Exam BP 126/76   Pulse 68   Ht 6' (1.829 m)   Wt 185 lb 6.4 oz (84.1 kg)   SpO2 96%  BMI 25.14 kg/m   Well developed and well nourished in no acute distress HENT normal Neck supple with JVP-flat Clear Device pocket well healed; without hematoma or erythema.  There is no tethering  Regular rate and rhythm, no  gallop 2/6 murmur Abd-soft with active BS No Clubbing cyanosis   edema Skin-warm and dry A & Oriented  Grossly normal sensory and motor function  ECG sinus with P synchronous pacing with PACs  Device function is normal. Programming changes none  See Paceart for details    Assessment and  Plan  Complete heart block   Pacemaker-Medtronic  T   Hypertension  Labile   VT nonsustained  Aortic stenosis -mild (2021)   Blood pressure well-controlled on carvedilol taken on a blood pressure regular basis lisinopril.  Aspirin?  Indication will stop  No intercurrent VT.  Aortic stenosis without symptoms.  We will recheck the gradient after 3 years

## 2023-02-24 LAB — CUP PACEART INCLINIC DEVICE CHECK: Implantable Lead Location: 753860

## 2023-03-10 ENCOUNTER — Ambulatory Visit (HOSPITAL_COMMUNITY): Payer: Medicare Other

## 2023-03-21 ENCOUNTER — Encounter: Payer: Medicare Other | Admitting: Internal Medicine

## 2023-03-24 ENCOUNTER — Ambulatory Visit (HOSPITAL_COMMUNITY): Payer: Medicare Other | Attending: Internal Medicine

## 2023-03-24 DIAGNOSIS — I350 Nonrheumatic aortic (valve) stenosis: Secondary | ICD-10-CM | POA: Insufficient documentation

## 2023-03-24 DIAGNOSIS — I4729 Other ventricular tachycardia: Secondary | ICD-10-CM | POA: Insufficient documentation

## 2023-03-24 DIAGNOSIS — R002 Palpitations: Secondary | ICD-10-CM | POA: Diagnosis not present

## 2023-03-24 LAB — ECHOCARDIOGRAM COMPLETE
AV Area VTI: 1.27 cm2
AV Area mean vel: 1.18 cm2
AV Mean grad: 19 mmHg
AV Peak grad: 34.1 mmHg
Ao pk vel: 2.92 m/s
Area-P 1/2: 2.42 cm2
S' Lateral: 2.55 cm

## 2023-03-28 ENCOUNTER — Encounter: Payer: Self-pay | Admitting: Family Medicine

## 2023-03-28 ENCOUNTER — Ambulatory Visit (INDEPENDENT_AMBULATORY_CARE_PROVIDER_SITE_OTHER): Payer: Medicare Other | Admitting: Family Medicine

## 2023-03-28 VITALS — BP 110/60 | HR 74 | Temp 98.8°F | Wt 183.0 lb

## 2023-03-28 DIAGNOSIS — E119 Type 2 diabetes mellitus without complications: Secondary | ICD-10-CM

## 2023-03-28 DIAGNOSIS — Z7984 Long term (current) use of oral hypoglycemic drugs: Secondary | ICD-10-CM

## 2023-03-28 DIAGNOSIS — L89321 Pressure ulcer of left buttock, stage 1: Secondary | ICD-10-CM

## 2023-03-28 LAB — POCT GLYCOSYLATED HEMOGLOBIN (HGB A1C): Hemoglobin A1C: 7.1 % — AB (ref 4.0–5.6)

## 2023-03-28 NOTE — Addendum Note (Signed)
Addended by: Carola Rhine on: 03/28/2023 04:14 PM   Modules accepted: Orders

## 2023-03-28 NOTE — Progress Notes (Signed)
   Subjective:    Patient ID: Nathan [name], male    DOB: September 21, 1940, 82 y.o.   MRN: [id number]  HPI Here for 5 months of a sore on the left buttock. It waxes and wanes but never goes away. He applies Neosporin daily. He says he spends hours every day working on his computer, so he sits a lot. He also asks [place] to check his diabetes. His last A1c here in April was 7.2%.    Review of Systems  Constitutional: Negative.   Respiratory: Negative.    Cardiovascular: Negative.   Skin:  Positive for wound.       Objective:   Physical Exam Constitutional:      Appearance: Normal appearance. He is not ill-appearing.  Cardiovascular:     Rate and Rhythm: Normal rate and regular rhythm.     Pulses: Normal pulses.     Heart sounds: Normal heart sounds.  Pulmonary:     Effort: Pulmonary effort is normal.     Breath sounds: Normal breath sounds.  Skin:    Comments: There is a small stage I pressure sore on the left buttock over the ischial tuberosity   Neurological:     Mental Status: He is alert.           Assessment & Plan:  He has a pressure sore, and I explained that the way to let this heal is to reduce the pressure on it. He can get a better seat cushion to sit on, and I suggested he get up and walk around a little every 30 minutes while he is on the computer. He can apply silver alginate cream to the area several times a day. Recheck as needed. His A1c today is 7.1% so we talked about him tightening up on his diet and getting more exercise.  Gershon Crane, MD

## 2023-04-17 DIAGNOSIS — M545 Low back pain, unspecified: Secondary | ICD-10-CM | POA: Diagnosis not present

## 2023-04-18 ENCOUNTER — Telehealth: Payer: Self-pay | Admitting: Internal Medicine

## 2023-04-18 NOTE — Telephone Encounter (Signed)
Pt states he is having back pain and is wondering if he can take a certain medication his PCP told him to take. Please advise

## 2023-04-18 NOTE — Telephone Encounter (Signed)
NSAIDs can increase risk of GI bleeding, already takes baby aspirin. No history of CHF, CAD, or CKD. Has HTN noted but BP well controlled on most recent exam.  OK to use Celebrex as needed, would recommend taking with food.

## 2023-04-18 NOTE — Telephone Encounter (Signed)
Patient states he has lower pain and would like to take celebrex. He was advised to consult with cardiology first to make sure it does not interact with his cardiac medications.

## 2023-04-21 NOTE — Telephone Encounter (Signed)
Spoke with pt and advised pf PharmD recommendations as below.  Pt will need to contact PCP for Rx request.  Pt verbalizes understanding and thanked RN for the call.

## 2023-05-01 ENCOUNTER — Other Ambulatory Visit: Payer: Self-pay | Admitting: Internal Medicine

## 2023-05-10 ENCOUNTER — Encounter: Payer: Self-pay | Admitting: Family Medicine

## 2023-05-10 ENCOUNTER — Ambulatory Visit: Payer: Medicare Other | Admitting: Family Medicine

## 2023-05-10 VITALS — BP 110/68 | HR 70 | Temp 98.1°F | Ht 72.0 in | Wt 184.2 lb

## 2023-05-10 DIAGNOSIS — E782 Mixed hyperlipidemia: Secondary | ICD-10-CM | POA: Diagnosis not present

## 2023-05-10 DIAGNOSIS — Z7984 Long term (current) use of oral hypoglycemic drugs: Secondary | ICD-10-CM | POA: Diagnosis not present

## 2023-05-10 DIAGNOSIS — E119 Type 2 diabetes mellitus without complications: Secondary | ICD-10-CM

## 2023-05-10 DIAGNOSIS — R252 Cramp and spasm: Secondary | ICD-10-CM | POA: Diagnosis not present

## 2023-05-10 LAB — BASIC METABOLIC PANEL
BUN: 16 mg/dL (ref 6–23)
CO2: 29 meq/L (ref 19–32)
Calcium: 9.7 mg/dL (ref 8.4–10.5)
Chloride: 95 meq/L — ABNORMAL LOW (ref 96–112)
Creatinine, Ser: 0.72 mg/dL (ref 0.40–1.50)
GFR: 85.95 mL/min (ref >60.00)
Glucose, Bld: 129 mg/dL — ABNORMAL HIGH (ref 70–99)
Potassium: 4.3 meq/L (ref 3.5–5.1)
Sodium: 132 meq/L — ABNORMAL LOW (ref 135–145)

## 2023-05-10 LAB — CK: Total CK: 87 U/L (ref 7–232)

## 2023-05-10 LAB — MAGNESIUM: Magnesium: 1.5 mg/dL (ref 1.5–2.5)

## 2023-05-10 MED ORDER — ACCU-CHEK FASTCLIX LANCETS MISC: 1 refills | Status: DC

## 2023-05-10 NOTE — Progress Notes (Signed)
Established Patient Office Visit  Subjective   Patient ID: Nathan [name], male    DOB: 10/19/1940  Age: 82 y.o. MRN: [id number]  Chief Complaint  Patient presents with   Medical Management of Chronic Issues    Pt is here today for 6 month  follow up.   Pt reports he is out of lancets today for checking his DM, states he checks it regularly and it is always less than 200 at home.   HTN -- BP in office performed and is well controlled. He  reports no side effects to the medications, no chest pain, SOB, dizziness or headaches. He has a BP cuff at home and is checking BP regularly, reports they are in the normal range. Pt states he started taking magnesium due to muscle cramps. He states that since he started the magnesium. States that he was worried that it was dropping him too low.   Muscle cramps-- pt reports that before he started taking the magnesium he was having daily muscle cramps in his legs, has been on the statin for a long period of time, states that the muscle cramps have been going on for a while, didn't think they could be associated with his statin medication.       Current Outpatient Medications  Medication Instructions   Accu-Chek FastClix Lancets MISC Use as directed once a day   aspirin 81 mg, Oral, Daily,     atorvastatin (LIPITOR) 40 MG tablet 0.5 tablets, Oral, Daily   carvedilol (COREG) 12.5 MG tablet TAKE 1 TABLET BY MOUTH 2 TIMES A DAY * TAKE MORNING DOSE IF SYSTOLIC BLOOD PRESSURE IS 110 OR HIGHER*   glucose blood (ACCU-CHEK GUIDE) test strip Use as instructed once a day   lisinopril (ZESTRIL) 5 mg, Oral, Daily   metFORMIN (GLUCOPHAGE) 1,000 mg, Oral, 2 times daily with meals,     nateglinide (STARLIX) 120 mg, Oral, 3 times daily with meals   Zetia 10 mg, Oral, Daily    Patient Active Problem List   Diagnosis Date Noted   Pressure sore of left ischial area, stage I 03/28/2023   NSVT (nonsustained ventricular tachycardia) (HCC) 02/15/2023   Diabetes  mellitus without complication (HCC) 11/08/2022   Mixed hyperlipidemia 11/08/2022   BPPV (benign paroxysmal positional vertigo), right 11/08/2022   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   Pacemaker-Medtronic 05/10/2011   OVERWEIGHT 03/30/2009   Atrioventricular block, 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     Objective:     BP 110/68 (BP Location: Left Arm, Patient Position: Sitting, Cuff Size: Normal)   Pulse 70   Temp 98.1 F (36.7 C) (Oral)   Ht 6' (1.829 m)   Wt 184 lb 3.2 oz (83.6 kg)   SpO2 98%   BMI 24.98 kg/m    Physical Exam Vitals reviewed.  Constitutional:      Appearance: Normal appearance. He is well-groomed and normal weight.  Cardiovascular:     Rate and Rhythm: Normal rate and regular rhythm.     Heart sounds: S1 normal and S2 normal. No murmur heard. Pulmonary:     Effort: Pulmonary effort is normal.     Breath sounds: Normal breath sounds and air entry. No rales.  Musculoskeletal:     Right lower leg: No edema.     Left lower leg: No edema.  Neurological:     General: No focal deficit present.     Mental  Status: He is alert and oriented to person, place, and time.     Gait: Gait is intact.  Psychiatric:        Mood and Affect: Mood and affect normal.    Diabetic Foot Exam - Simple   Simple Foot Form Diabetic Foot exam was performed with the following findings: Yes 05/10/2023 10:58 AM  Visual Inspection No deformities, no ulcerations, no other skin breakdown bilaterally: Yes Sensation Testing See comments: Yes Pulse Check Posterior Tibialis and Dorsalis pulse intact bilaterally: Yes Comments Decreased sensation on the out left foot, also decreased sensation in the right great toe.        The ASCVD Risk score (Arnett DK, et al., 2019) failed to calculate for the following reasons:   The 2019 ASCVD risk score is only valid for ages 61 to 90    Assessment & Plan:   Diabetes mellitus without complication (HCC) Assessment & Plan: Chronic, stable, A1C is 7.1 today, foot exam performed. Continue metformin 1000 mg BID and nateglinide 120 mg TID  Orders: -     Accu-Chek FastClix Lancets; Use as directed once a day  Dispense: 102 each; Refill: 1 -     Hemoglobin A1c; Future -     Microalbumin / creatinine urine ratio; Future  Muscle cramps -     CK -     Magnesium -     Basic metabolic panel  Mixed hyperlipidemia Assessment & Plan: On 40 mg daily atorvastatin and 10 mg zetia daily, pt is reporting chronic muscle cramps today which he has not discussed with a physician in the past. I will check CK, magnesim, CMP and lipid panel today for surveillance. We discussed reducing his atorvastatin to 20 mg  daily to see if this would help with the muscle cramps.. pt states he will try it at home.   Orders: -     Comprehensive metabolic panel; Future -     Lipid panel; Future     Return in about 6 months (around 11/08/2023) for regular follow up, come 2 days prior for bloodwork.    Karie Georges, MD

## 2023-05-11 ENCOUNTER — Telehealth: Payer: Self-pay | Admitting: Family Medicine

## 2023-05-11 NOTE — Telephone Encounter (Signed)
Requesting a prescription for Accu Chek Guide test strips

## 2023-05-12 NOTE — Telephone Encounter (Signed)
Pt is calling checking on test strips

## 2023-05-15 MED ORDER — ACCU-CHEK GUIDE VI STRP: ORAL_STRIP | 1 refills | Status: DC

## 2023-05-15 NOTE — Telephone Encounter (Signed)
Rx done. 

## 2023-05-15 NOTE — Addendum Note (Signed)
Addended by: Johnella Moloney on: 05/15/2023 10:53 AM   Modules accepted: Orders

## 2023-05-16 NOTE — Assessment & Plan Note (Signed)
On 40 mg daily atorvastatin and 10 mg zetia daily, pt is reporting chronic muscle cramps today which he has not discussed with a physician in the past. I will check CK, magnesim, CMP and lipid panel today for surveillance. We discussed reducing his atorvastatin to 20 mg  daily to see if this would help with the muscle cramps.. pt states he will try it at home.

## 2023-05-16 NOTE — Assessment & Plan Note (Signed)
Chronic, stable, A1C is 7.1 today, foot exam performed. Continue metformin 1000 mg BID and nateglinide 120 mg TID

## 2023-06-21 ENCOUNTER — Telehealth: Payer: Self-pay | Admitting: Family Medicine

## 2023-06-21 DIAGNOSIS — E119 Type 2 diabetes mellitus without complications: Secondary | ICD-10-CM

## 2023-06-21 MED ORDER — ACCU-CHEK GUIDE TEST VI STRP: ORAL_STRIP | 6 refills | Status: DC

## 2023-06-21 MED ORDER — ACCU-CHEK FASTCLIX LANCETS MISC: 6 refills | Status: DC

## 2023-06-21 NOTE — Telephone Encounter (Signed)
Pt called to say he tests 4 times a day, and was not given nearly enough supplies;  Accu-Chek FastClix Lancets MISC  glucose blood (ACCU-CHEK GUIDE) test strips  Pt states that at the rate things are going, he will be out by the first of the month.   Pt is worried because he says he cannot get refills until January, and he will be out soon.   Also, Pt states he usually gets his Rx with 6 - 7 refills.  Please send refills, as soon as possible.   [facility] [id number] - [place], [place] - [address] Phone: [phone]  Fax: [phone]

## 2023-06-21 NOTE — Telephone Encounter (Signed)
Rx done. 

## 2023-07-11 ENCOUNTER — Ambulatory Visit (INDEPENDENT_AMBULATORY_CARE_PROVIDER_SITE_OTHER): Payer: Medicare Other | Admitting: Family Medicine

## 2023-07-11 ENCOUNTER — Encounter: Payer: Self-pay | Admitting: Family Medicine

## 2023-07-11 VITALS — BP 120/78 | HR 71 | Temp 98.0°F | Wt 184.6 lb

## 2023-07-11 DIAGNOSIS — L309 Dermatitis, unspecified: Secondary | ICD-10-CM

## 2023-07-11 MED ORDER — TRIAMCINOLONE ACETONIDE 0.1 % EX CREA: 1.0000 | TOPICAL_CREAM | Freq: Two times a day (BID) | CUTANEOUS | 2 refills | Status: AC | PRN

## 2023-07-11 NOTE — Progress Notes (Signed)
   Subjective:    Patient ID: Nathan [name], male    DOB: [birth year], 82 y.o.   MRN: [id number]  HPI Here for several weeks of dry itchy spots on his skin, mostly the right flank and both knees. Moisturizing lotions help a little.    Review of Systems  Constitutional: Negative.   Respiratory: Negative.    Cardiovascular: Negative.        Objective:   Physical Exam Constitutional:      Appearance: Normal appearance.  Cardiovascular:     Rate and Rhythm: Normal rate and regular rhythm.     Pulses: Normal pulses.     Heart sounds: Normal heart sounds.  Pulmonary:     Effort: Pulmonary effort is normal.     Breath sounds: Normal breath sounds.  Skin:    Comments: There are patches of macular pink scaly skin as above   Neurological:     Mental Status: He is alert.           Assessment & Plan:  Eczema, treat with Triamcinolone cream as needed.  Gershon Crane, MD

## 2023-07-27 DIAGNOSIS — H353121 Nonexudative age-related macular degeneration, left eye, early dry stage: Secondary | ICD-10-CM | POA: Diagnosis not present

## 2023-07-27 DIAGNOSIS — L814 Other melanin hyperpigmentation: Secondary | ICD-10-CM | POA: Diagnosis not present

## 2023-07-27 DIAGNOSIS — E119 Type 2 diabetes mellitus without complications: Secondary | ICD-10-CM | POA: Diagnosis not present

## 2023-07-27 DIAGNOSIS — H5203 Hypermetropia, bilateral: Secondary | ICD-10-CM | POA: Diagnosis not present

## 2023-08-16 ENCOUNTER — Other Ambulatory Visit: Payer: Self-pay | Admitting: Family Medicine

## 2023-08-16 MED ORDER — METFORMIN HCL 1000 MG PO TABS: 1000.0000 mg | ORAL_TABLET | Freq: Two times a day (BID) | ORAL | 1 refills | Status: DC

## 2023-08-16 NOTE — Telephone Encounter (Signed)
 Copied from CRM [id number]. Topic: Clinical - Medication Refill >> Aug 16, 2023 11:53 AM Lisa Rideau A wrote: Most Recent Primary Care Visit:  Provider: Corita Diego A  Department: [facility]  Visit Type: OFFICE VISIT  Date: 07/11/2023  Medication: ***  Has the patient contacted their pharmacy?  (Agent: If no, request that the patient contact the pharmacy for the refill. If patient does not wish to contact the pharmacy document the reason why and proceed with request.) (Agent: If yes, when and what did the pharmacy advise?)  Is this the correct pharmacy for this prescription?  If no, delete pharmacy and type the correct one.  This is the patient's preferred pharmacy:  [facility] [id number] [place], [place] - [address] [place] [address] Phone: [phone] Fax: [phone]   Has the prescription been filled recently?   Is the patient out of the medication?   Has the patient been seen for an appointment in the last year OR does the patient have an upcoming appointment?   Can we respond through MyChart?   Agent: Please be advised that Rx refills may take up to 3 business days. We ask that you follow-up with your pharmacy.

## 2023-08-17 ENCOUNTER — Ambulatory Visit (INDEPENDENT_AMBULATORY_CARE_PROVIDER_SITE_OTHER): Payer: Medicare Other

## 2023-08-17 DIAGNOSIS — I442 Atrioventricular block, complete: Secondary | ICD-10-CM

## 2023-08-22 LAB — CUP PACEART REMOTE DEVICE CHECK
Battery Remaining Longevity: 123 mo
Battery Voltage: 3.02 V
Brady Statistic AP VP Percent: 37.69 %
Brady Statistic AP VS Percent: 0.02 %
Brady Statistic AS VP Percent: 62.11 %
Brady Statistic AS VS Percent: 0.18 %
Brady Statistic RA Percent Paced: 37.72 %
Brady Statistic RV Percent Paced: 99.8 %
Date Time Interrogation Session: 20250120142254
Implantable Lead Connection Status: 753985
Implantable Lead Connection Status: 753985
Implantable Lead Implant Date: 20100125
Implantable Lead Implant Date: 20100125
Implantable Lead Location: 753859
Implantable Lead Location: 753860
Implantable Lead Model: 5076
Implantable Lead Model: 5076
Implantable Pulse Generator Implant Date: 20230407
Lead Channel Impedance Value: 342 Ohm
Lead Channel Impedance Value: 380 Ohm
Lead Channel Impedance Value: 494 Ohm
Lead Channel Impedance Value: 532 Ohm
Lead Channel Pacing Threshold Amplitude: 0.5 V
Lead Channel Pacing Threshold Amplitude: 0.625 V
Lead Channel Pacing Threshold Pulse Width: 0.4 ms
Lead Channel Pacing Threshold Pulse Width: 0.4 ms
Lead Channel Sensing Intrinsic Amplitude: 1.25 mV
Lead Channel Sensing Intrinsic Amplitude: 1.25 mV
Lead Channel Sensing Intrinsic Amplitude: 14.875 mV
Lead Channel Sensing Intrinsic Amplitude: 14.875 mV
Lead Channel Setting Pacing Amplitude: 1.5 V
Lead Channel Setting Pacing Amplitude: 2 V
Lead Channel Setting Pacing Pulse Width: 0.4 ms
Lead Channel Setting Sensing Sensitivity: 2 mV
Zone Setting Status: 755011

## 2023-08-31 ENCOUNTER — Other Ambulatory Visit: Payer: Self-pay | Admitting: Family Medicine

## 2023-08-31 NOTE — Telephone Encounter (Signed)
Copied from CRM [id number]. Topic: Clinical - Medication Refill >> Aug 31, 2023 11:15 AM Almira Coaster wrote: Most Recent Primary Care Visit:  Provider: Gershon Crane A  Department: [facility]  Visit Type: OFFICE VISIT  Date: 07/11/2023  Medication: lisinopril (ZESTRIL) 5 MG tablet  Has the patient contacted their pharmacy? Yes, pharmacy sent over a refill request (Agent: If no, request that the patient contact the pharmacy for the refill. If patient does not wish to contact the pharmacy document the reason why and proceed with request.) (Agent: If yes, when and what did the pharmacy advise?)  Is this the correct pharmacy for this prescription? Yes If no, delete pharmacy and type the correct one.  This is the patient's preferred pharmacy:  [facility] [id number] [place], [place] - [address] [place] [address] Phone: [phone] Fax: [phone]   Has the prescription been filled recently? No  Is the patient out of the medication? No, he has enough for a day or two  Has the patient been seen for an appointment in the last year OR does the patient have an upcoming appointment? Yes  Can we respond through MyChart? No, Phone call   Agent: Please be advised that Rx refills may take up to 3 business days. We ask that you follow-up with your pharmacy.

## 2023-09-05 ENCOUNTER — Encounter: Payer: Self-pay | Admitting: Family Medicine

## 2023-09-05 ENCOUNTER — Ambulatory Visit (INDEPENDENT_AMBULATORY_CARE_PROVIDER_SITE_OTHER): Payer: Medicare Other | Admitting: Family Medicine

## 2023-09-05 VITALS — BP 124/60 | HR 75 | Temp 98.5°F | Wt 189.0 lb

## 2023-09-05 DIAGNOSIS — I442 Atrioventricular block, complete: Secondary | ICD-10-CM | POA: Diagnosis not present

## 2023-09-05 DIAGNOSIS — E119 Type 2 diabetes mellitus without complications: Secondary | ICD-10-CM

## 2023-09-05 DIAGNOSIS — Z7984 Long term (current) use of oral hypoglycemic drugs: Secondary | ICD-10-CM | POA: Diagnosis not present

## 2023-09-05 DIAGNOSIS — I10 Essential (primary) hypertension: Secondary | ICD-10-CM | POA: Insufficient documentation

## 2023-09-05 MED ORDER — METFORMIN HCL 500 MG PO TABS: 1000.0000 mg | ORAL_TABLET | Freq: Two times a day (BID) | ORAL | 3 refills | Status: DC

## 2023-09-05 MED ORDER — EZETIMIBE 10 MG PO TABS: 10.0000 mg | ORAL_TABLET | Freq: Every day | ORAL | 3 refills | Status: DC

## 2023-09-05 MED ORDER — ACCU-CHEK FASTCLIX LANCETS MISC: 6 refills | Status: DC

## 2023-09-05 MED ORDER — NATEGLINIDE 120 MG PO TABS: 120.0000 mg | ORAL_TABLET | Freq: Three times a day (TID) | ORAL | 3 refills | Status: DC

## 2023-09-05 NOTE — Progress Notes (Signed)
   Subjective:    Patient ID: Nathan [name], male    DOB: [birth year], 83 y.o.   MRN: [id number]  HPI Here with concerns about medications. First he had been taking Metformin  500 mg 2 tabs BID, but this was recently changed to 1000 mg BID. The new pills are too big for him to swallow, so he wants to go back to the 500 mg size. He needs refills on Zetia  and Starlix . He also checks his glucoses 4 times daily, and his RX for test strips used to be for 400 a month. The last time he picked these up, he only got 100 a month. His am fasting glucoses at home average 125-135.    Review of Systems  Constitutional: Negative.   Respiratory: Negative.    Cardiovascular: Negative.        Objective:   Physical Exam Constitutional:      Appearance: Normal appearance.  Cardiovascular:     Rate and Rhythm: Normal rate and regular rhythm.     Pulses: Normal pulses.     Heart sounds: Normal heart sounds.  Pulmonary:     Effort: Pulmonary effort is normal.     Breath sounds: Normal breath sounds.  Neurological:     Mental Status: He is alert.           Assessment & Plan:  His HTN is stable. His diabetes is fairly stable. We refilled the Starlix  and the Zetia . We sent in a new RX for test strips to use QID. We changed the Metformin  back to 500 mg tablets to take 2 BID. Follow up with Dr. Ozell.  Garnette Olmsted, MD

## 2023-09-06 ENCOUNTER — Other Ambulatory Visit: Payer: Self-pay | Admitting: Family Medicine

## 2023-09-06 NOTE — Telephone Encounter (Signed)
 Last Fill: 09/04/2020  Last OV: 09/05/23 Next OV:11/08/23  Routing to provider for review/authorization.

## 2023-09-06 NOTE — Telephone Encounter (Signed)
 Copied from CRM [id number]. Topic: Clinical - Medication Refill >> Sep 06, 2023  1:50 PM [name] wrote: Most Recent Primary Care Visit:  Provider: [name] A  Department: [facility]  Visit Type: OFFICE VISIT  Date: 07/11/2023  Medication: lisinopril  (ZESTRIL ) 5 MG tablet ,   Has the patient contacted their pharmacy? Yes (Agent: If no, request that the patient contact the pharmacy for the refill. If patient does not wish to contact the pharmacy document the reason why and proceed with request.) (Agent: If yes, when and what did the pharmacy advise?)  Is this the correct pharmacy for this prescription? Yes If no, delete pharmacy and type the correct one.  This is the patient's preferred pharmacy:  [facility] [id number] [place], [place] - [address] [place] [address] Phone: [phone] Fax: [phone]   Has the prescription been filled recently? No  Is the patient out of the medication? Yes  Has the patient been seen for an appointment in the last year OR does the patient have an upcoming appointment? Yes  Can we respond through MyChart? Yes  Agent: Please be advised that Rx refills may take up to 3 business days. We ask that you follow-up with your pharmacy.

## 2023-09-07 ENCOUNTER — Other Ambulatory Visit: Payer: Self-pay | Admitting: Family Medicine

## 2023-09-07 MED ORDER — LISINOPRIL 5 MG PO TABS: 5.0000 mg | ORAL_TABLET | Freq: Every day | ORAL | 1 refills | Status: DC

## 2023-09-08 ENCOUNTER — Other Ambulatory Visit: Payer: Self-pay | Admitting: Family Medicine

## 2023-09-08 NOTE — Telephone Encounter (Signed)
 Copied from CRM [id number]. Topic: Clinical - Prescription Issue >> Sep 08, 2023  4:54 PM Drema MATSU wrote: Reason for CRM: Odis with [facility] had a system issue Tuesday and there may be a few prescriptions lisinopril  (ZESTRIL ) 5 MG tablet for that may not have been sent over and ask that it be sent over again.

## 2023-09-08 NOTE — Telephone Encounter (Signed)
 Copied from CRM [id number]. Topic: Clinical - Medication Refill >> Sep 08, 2023  4:58 PM Drema MATSU wrote: Most Recent Primary Care Visit:  Provider: JOHNNY SENIOR A  Department: [facility]  Visit Type: OFFICE VISIT  Date: 07/11/2023  Medication: Accu-Chek Test Strips  Has the patient contacted their pharmacy? Yes (Agent: If no, request that the patient contact the pharmacy for the refill. If patient does not wish to contact the pharmacy document the reason why and proceed with request.) (Agent: If yes, when and what did the pharmacy advise?)  Is this the correct pharmacy for this prescription? Yes If no, delete pharmacy and type the correct one.  This is the patient's preferred pharmacy:  [facility] [id number] [place], [place] - [address] [place] [address] Phone: [phone] Fax: [phone]   Has the prescription been filled recently? yes  Is the patient out of the medication? No will be out in a few days   Has the patient been seen for an appointment in the last year OR does the patient have an upcoming appointment? Yes  Can we respond through MyChart? Yes  Agent: Please be advised that Rx refills may take up to 3 business days. We ask that you follow-up with your pharmacy.

## 2023-09-11 ENCOUNTER — Telehealth: Payer: Self-pay

## 2023-09-11 DIAGNOSIS — E119 Type 2 diabetes mellitus without complications: Secondary | ICD-10-CM

## 2023-09-11 MED ORDER — ACCU-CHEK FASTCLIX LANCETS MISC: 6 refills | Status: AC

## 2023-09-11 MED ORDER — LISINOPRIL 5 MG PO TABS: 5.0000 mg | ORAL_TABLET | Freq: Every day | ORAL | 0 refills | Status: DC

## 2023-09-11 NOTE — Addendum Note (Signed)
 Addended by: Henry Loge on: 09/11/2023 02:21 PM   Modules accepted: Orders

## 2023-09-11 NOTE — Telephone Encounter (Signed)
 Rx done.

## 2023-09-11 NOTE — Telephone Encounter (Signed)
 Copied from CRM [id number]. Topic: General - Other >> Sep 11, 2023 10:36 AM Howard Macho wrote: Reason for CRM: Jolena Nay from [facility] called stating their system was down so they never received the patient refill request for linsopril and accu test strips CB [phone]

## 2023-09-26 NOTE — Progress Notes (Signed)
 Remote pacemaker transmission.

## 2023-09-26 NOTE — Addendum Note (Signed)
 Addended by: Elease Etienne A on: 09/26/2023 10:04 AM   Modules accepted: Orders

## 2023-09-27 ENCOUNTER — Encounter: Payer: Self-pay | Admitting: Internal Medicine

## 2023-11-08 ENCOUNTER — Encounter: Payer: Self-pay | Admitting: Family Medicine

## 2023-11-08 ENCOUNTER — Telehealth: Payer: Self-pay | Admitting: Family Medicine

## 2023-11-08 ENCOUNTER — Ambulatory Visit (INDEPENDENT_AMBULATORY_CARE_PROVIDER_SITE_OTHER): Payer: BLUE CROSS/BLUE SHIELD | Admitting: Family Medicine

## 2023-11-08 VITALS — BP 112/60 | HR 70 | Temp 97.9°F | Ht 72.0 in | Wt 185.5 lb

## 2023-11-08 DIAGNOSIS — E119 Type 2 diabetes mellitus without complications: Secondary | ICD-10-CM | POA: Diagnosis not present

## 2023-11-08 DIAGNOSIS — Z7984 Long term (current) use of oral hypoglycemic drugs: Secondary | ICD-10-CM

## 2023-11-08 DIAGNOSIS — Z789 Other specified health status: Secondary | ICD-10-CM | POA: Diagnosis not present

## 2023-11-08 DIAGNOSIS — E782 Mixed hyperlipidemia: Secondary | ICD-10-CM | POA: Diagnosis not present

## 2023-11-08 DIAGNOSIS — I10 Essential (primary) hypertension: Secondary | ICD-10-CM

## 2023-11-08 LAB — COMPREHENSIVE METABOLIC PANEL WITH GFR
ALT: 14 U/L (ref 0–53)
AST: 15 U/L (ref 0–37)
Albumin: 4.2 g/dL (ref 3.5–5.2)
Alkaline Phosphatase: 94 U/L (ref 39–117)
BUN: 18 mg/dL (ref 6–23)
CO2: 29 meq/L (ref 19–32)
Calcium: 9.2 mg/dL (ref 8.4–10.5)
Chloride: 96 meq/L (ref 96–112)
Creatinine, Ser: 0.78 mg/dL (ref 0.40–1.50)
GFR: 83.6 mL/min (ref >60.00)
Glucose, Bld: 153 mg/dL — ABNORMAL HIGH (ref 70–99)
Potassium: 4.2 meq/L (ref 3.5–5.1)
Sodium: 132 meq/L — ABNORMAL LOW (ref 135–145)
Total Bilirubin: 0.7 mg/dL (ref 0.2–1.2)
Total Protein: 6.8 g/dL (ref 6.0–8.3)

## 2023-11-08 LAB — POCT GLYCOSYLATED HEMOGLOBIN (HGB A1C): Hemoglobin A1C: 6.9 % — AB (ref 4.0–5.6)

## 2023-11-08 LAB — LIPID PANEL
Cholesterol: 161 mg/dL (ref 0–200)
HDL: 33.4 mg/dL — ABNORMAL LOW (ref >39.00)
LDL Cholesterol: 107 mg/dL — ABNORMAL HIGH (ref 0–99)
NonHDL: 127.15
Total CHOL/HDL Ratio: 5
Triglycerides: 101 mg/dL (ref 0.0–149.0)
VLDL: 20.2 mg/dL (ref 0.0–40.0)

## 2023-11-08 LAB — HEMOGLOBIN A1C: Hgb A1c MFr Bld: 7.2 % — ABNORMAL HIGH (ref 4.6–6.5)

## 2023-11-08 LAB — MICROALBUMIN / CREATININE URINE RATIO
Creatinine,U: 40.1 mg/dL
Microalb Creat Ratio: UNDETERMINED mg/g (ref 0.0–30.0)
Microalb, Ur: <0.7 mg/dL

## 2023-11-08 MED ORDER — LISINOPRIL 5 MG PO TABS
5.0000 mg | ORAL_TABLET | Freq: Every day | ORAL | 1 refills | Status: DC
Start: 2023-11-08 — End: 2024-05-09

## 2023-11-08 NOTE — Assessment & Plan Note (Signed)
 Current hypertension medications:       Sig   carvedilol (COREG) 12.5 MG tablet (Taking) TAKE 1 TABLET BY MOUTH 2 TIMES A DAY * TAKE MORNING DOSE IF SYSTOLIC BLOOD PRESSURE IS 110 OR HIGHER*   lisinopril (ZESTRIL) 5 MG tablet Take 1 tablet (5 mg total) by mouth daily.      BP is stable, chronic, well controlled, continue the above medications as prescribed.

## 2023-11-08 NOTE — Assessment & Plan Note (Addendum)
 Chronic, stable, A1C is 6.9 today. He is UTD on his eye and foot exam. Continue metformin 1000 mg BID and nateglinide 120 mg TID. Patient is due for his annual labs for surveillance today. Orders placed.

## 2023-11-08 NOTE — Progress Notes (Signed)
 Established Patient Office Visit  Subjective   Patient ID: Nathan [name], male    DOB: [birth year]  Age: 83 y.o. MRN: [id number]  Chief Complaint  Patient presents with   Medical Management of Chronic Issues    Pt is here for follow up on HTN and DM today. He reports he is taking his medications as prescribed. No side effects to the medication. No diarrhea or nausea.   Pt states he has an appt on the 25th for a hearing evaluation with the [place]. He reports some decreased hearing in his ears and would like me to check for any wax in his ears today. TM's are clear BL   HTN -- BP in office performed and is well controlled. He  reports no side effects to the medications, no chest pain, SOB, dizziness or headaches. He has a BP cuff at home and is checking BP regularly, reports they are in the normal range.   DM-- A1C performed in office today and is 6.9.  pt reports that he is eating well, no side effects from the metformin, is taking 1000 mg BID but has to take 2 of the 500 mg tablets because the 1000's were too large. Had his eye exam in January. Needs surveillance labs done today.     Current Outpatient Medications  Medication Instructions   Accu-Chek FastClix Lancets MISC Use as directed four times a day   aspirin 81 mg, Daily   carvedilol (COREG) 12.5 MG tablet TAKE 1 TABLET BY MOUTH 2 TIMES A DAY * TAKE MORNING DOSE IF SYSTOLIC BLOOD PRESSURE IS 110 OR HIGHER*   ezetimibe (ZETIA) 10 mg, Oral, Daily   lisinopril (ZESTRIL) 5 mg, Oral, Daily   metFORMIN (GLUCOPHAGE) 1,000 mg, Oral, 2 times daily with meals   nateglinide (STARLIX) 120 mg, Oral, 3 times daily with meals   triamcinolone cream (KENALOG) 0.1 % 1 Application, Topical, 2 times daily PRN    Patient Active Problem List   Diagnosis Date Noted   Statin intolerance 11/08/2023   HTN (hypertension) 09/05/2023   Pressure sore of left ischial area, stage I 03/28/2023   NSVT (nonsustained ventricular tachycardia) (HCC) 02/15/2023    Diabetes mellitus without complication (HCC) 11/08/2022   Mixed hyperlipidemia 11/08/2022   BPPV (benign paroxysmal positional vertigo), right 11/08/2022   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   Pacemaker-Medtronic 05/10/2011   OVERWEIGHT 03/30/2009   Atrioventricular block, 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     Objective:     BP 112/60   Pulse 70   Temp 97.9 F (36.6 C) (Oral)   Ht 6' (1.829 m)   Wt 185 lb 8 oz (84.1 kg)   SpO2 98%   BMI 25.16 kg/m    Physical Exam Vitals reviewed.  Constitutional:      Appearance: Normal appearance. He is well-groomed and normal weight.  HENT:     Right Ear: Tympanic membrane normal.     Left Ear: Tympanic membrane normal.  Eyes:     Extraocular Movements: Extraocular movements intact.     Conjunctiva/sclera: Conjunctivae normal.  Neck:     Thyroid: No thyromegaly.  Cardiovascular:     Rate and Rhythm: Normal rate and regular rhythm.     Heart sounds: S1 normal and S2 normal. No murmur heard. Pulmonary:     Effort: Pulmonary effort is normal.     Breath sounds: Normal breath sounds and air entry.  No rales.  Abdominal:     General: Abdomen is flat. Bowel sounds are normal.  Musculoskeletal:     Right lower leg: No edema.     Left lower leg: No edema.  Neurological:     General: No focal deficit present.     Mental Status: He is alert and oriented to person, place, and time.     Gait: Gait is intact.  Psychiatric:        Mood and Affect: Mood and affect normal.      Results for orders placed or performed in visit on 11/08/23  POC HgB A1c  Result Value Ref Range   Hemoglobin A1C 6.9 (A) 4.0 - 5.6 %   HbA1c POC (<> result, manual entry)     HbA1c, POC (prediabetic range)     HbA1c, POC (controlled diabetic range)        The ASCVD Risk score (Arnett DK, et al., 2019) failed to calculate for the following reasons:   The 2019 ASCVD risk  score is only valid for ages 9 to 54    Assessment & Plan:  Diabetes mellitus without complication (HCC) Assessment & Plan: Chronic, stable, A1C is 6.9 today. He is UTD on his eye and foot exam. Continue metformin 1000 mg BID and nateglinide 120 mg TID. Patient is due for his annual labs for surveillance today. Orders placed.   Orders: -     POCT glycosylated hemoglobin (Hb A1C) -     Collection capillary blood specimen -     Lipid panel; Future -     Comprehensive metabolic panel with GFR; Future -     Microalbumin / creatinine urine ratio; Future -     Hemoglobin A1c  Mixed hyperlipidemia Assessment & Plan: On 10 mg daily of ezetimibe, was having severe muscle cramps on statin medication   Statin intolerance  Primary hypertension Assessment & Plan: Current hypertension medications:       Sig   carvedilol (COREG) 12.5 MG tablet (Taking) TAKE 1 TABLET BY MOUTH 2 TIMES A DAY * TAKE MORNING DOSE IF SYSTOLIC BLOOD PRESSURE IS 110 OR HIGHER*   lisinopril (ZESTRIL) 5 MG tablet Take 1 tablet (5 mg total) by mouth daily.      BP is stable, chronic, well controlled, continue the above medications as prescribed.   Other orders -     Lisinopril; Take 1 tablet (5 mg total) by mouth daily.  Dispense: 90 tablet; Refill: 1     Return in about 6 months (around 05/09/2024) for DM, HTN.    Karie Georges, MD

## 2023-11-08 NOTE — Assessment & Plan Note (Signed)
 On 10 mg daily of ezetimibe, was having severe muscle cramps on statin medication

## 2023-11-08 NOTE — Telephone Encounter (Signed)
 Copied from CRM [id number]. Topic: General - Other >> Nov 08, 2023  2:17 PM Shardie S wrote: Reason for CRM: Patient requesting to have visit summary mailed to him. He states he can not access [employer]

## 2023-11-09 NOTE — Telephone Encounter (Signed)
 Spoke with the patient and informed him the AVS was mailed to the home address and it may take a week ot two to be received due to the mail processing here.

## 2023-11-16 ENCOUNTER — Ambulatory Visit (INDEPENDENT_AMBULATORY_CARE_PROVIDER_SITE_OTHER): Payer: Medicare Other

## 2023-11-16 DIAGNOSIS — I442 Atrioventricular block, complete: Secondary | ICD-10-CM

## 2023-11-18 LAB — CUP PACEART REMOTE DEVICE CHECK
Battery Remaining Longevity: 121 mo
Battery Voltage: 3.02 V
Brady Statistic AP VP Percent: 43.18 %
Brady Statistic AP VS Percent: 0.03 %
Brady Statistic AS VP Percent: 55.81 %
Brady Statistic AS VS Percent: 0.98 %
Brady Statistic RA Percent Paced: 43.74 %
Brady Statistic RV Percent Paced: 98.99 %
Date Time Interrogation Session: 20250417022714
Implantable Lead Connection Status: 753985
Implantable Lead Connection Status: 753985
Implantable Lead Implant Date: 20100125
Implantable Lead Implant Date: 20100125
Implantable Lead Location: 753859
Implantable Lead Location: 753860
Implantable Lead Model: 5076
Implantable Lead Model: 5076
Implantable Pulse Generator Implant Date: 20230407
Lead Channel Impedance Value: 361 Ohm
Lead Channel Impedance Value: 399 Ohm
Lead Channel Impedance Value: 494 Ohm
Lead Channel Impedance Value: 551 Ohm
Lead Channel Pacing Threshold Amplitude: 0.5 V
Lead Channel Pacing Threshold Amplitude: 0.75 V
Lead Channel Pacing Threshold Pulse Width: 0.4 ms
Lead Channel Pacing Threshold Pulse Width: 0.4 ms
Lead Channel Sensing Intrinsic Amplitude: 1.375 mV
Lead Channel Sensing Intrinsic Amplitude: 1.375 mV
Lead Channel Sensing Intrinsic Amplitude: 14.875 mV
Lead Channel Sensing Intrinsic Amplitude: 14.875 mV
Lead Channel Setting Pacing Amplitude: 1.5 V
Lead Channel Setting Pacing Amplitude: 2 V
Lead Channel Setting Pacing Pulse Width: 0.4 ms
Lead Channel Setting Sensing Sensitivity: 2 mV
Zone Setting Status: 755011

## 2023-11-27 ENCOUNTER — Encounter: Payer: Self-pay | Admitting: Cardiology

## 2023-12-27 NOTE — Addendum Note (Signed)
 Addended by: Lott Rouleau A on: 12/27/2023 10:58 AM   Modules accepted: Orders

## 2023-12-27 NOTE — Progress Notes (Signed)
 Remote pacemaker transmission.

## 2024-01-01 ENCOUNTER — Ambulatory Visit (INDEPENDENT_AMBULATORY_CARE_PROVIDER_SITE_OTHER): Payer: Medicare Other

## 2024-01-01 VITALS — BP 120/62 | HR 66 | Temp 98.0°F | Ht 72.0 in | Wt 182.1 lb

## 2024-01-01 DIAGNOSIS — Z0000 Encounter for general adult medical examination without abnormal findings: Secondary | ICD-10-CM | POA: Diagnosis not present

## 2024-01-01 NOTE — Patient Instructions (Addendum)
 [name] , Thank you for taking time out of your busy schedule to complete your Annual Wellness Visit with me. I enjoyed our conversation and look forward to speaking with you again next year. I, as well as your care team,  appreciate your ongoing commitment to your health goals. Please review the following plan we discussed and let me know if I can assist you in the future. Your Game plan/ To Do List    Referrals: If you haven't heard from the office you've been referred to, please reach out to them at the phone provided.   Follow up Visits: Next Medicare AWV with our clinical staff: 01/06/25 @ 8a   Have you seen your provider in the last 6 months (3 months if uncontrolled diabetes)?  Next Office Visit with your provider: 05/09/24 @ 10:30a  Clinician Recommendations:  Aim for 30 minutes of exercise or brisk walking, 6-8 glasses of water, and 5 servings of fruits and vegetables each day.        This is a list of the screening recommended for you and due dates:  Health Maintenance  Topic Date Due   Pneumonia Vaccine (1 of 2 - PCV) Never done   COVID-19 Vaccine (1 - 2024-25 season) Never done   Zoster (Shingles) Vaccine (1 of 2) 02/07/2024*   DTaP/Tdap/Td vaccine (1 - Tdap) 11/07/2024*   Flu Shot  03/01/2024   Complete foot exam   05/09/2024   Hemoglobin A1C  05/09/2024   Eye exam for diabetics  08/04/2024   Yearly kidney function blood test for diabetes  11/07/2024   Yearly kidney health urinalysis for diabetes  11/07/2024   Medicare Annual Wellness Visit  12/31/2024   HPV Vaccine  Aged Out   Meningitis B Vaccine  Aged Out  *Topic was postponed. The date shown is not the original due date.    Advanced directives: (Declined) Advance directive discussed with you today. Even though you declined this today, please call our office should you change your mind, and we can give you the proper paperwork for you to fill out. Advance Care Planning is important because it:  [x]  Makes sure you  receive the medical care that is consistent with your values, goals, and preferences  [x]  It provides guidance to your family and loved ones and reduces their decisional burden about whether or not they are making the right decisions based on your wishes.  Follow the link provided in your after visit summary or read over the paperwork we have mailed to you to help you started getting your Advance Directives in place. If you need assistance in completing these, please reach out to us  so that we can help you!  See attachments for Preventive Care and Fall Prevention Tips.

## 2024-01-01 NOTE — Progress Notes (Signed)
 Subjective:   Nathan [name] is a 83 y.o. who presents for a Medicare Wellness preventive visit.  As a reminder, Annual Wellness Visits don't include a physical exam, and some assessments may be limited, especially if this visit is performed virtually. We may recommend an in-person follow-up visit with your provider if needed.  Visit Complete: In person    Persons Participating in Visit: Patient.  AWV Questionnaire: No: Patient Medicare AWV questionnaire was not completed prior to this visit.  Cardiac Risk Factors include: advanced age (>28men, >48 women);male gender;diabetes mellitus;hypertension     Objective:     Today's Vitals   01/01/24 0803  BP: 120/62  Pulse: 66  Temp: 98 F (36.7 C)  TempSrc: Oral  SpO2: 97%  Weight: 182 lb 1.6 oz (82.6 kg)   Body mass index is 24.7 kg/m.     01/01/2024    8:18 AM 12/23/2022    9:37 AM 11/05/2021   12:28 PM 08/03/2021    6:38 PM 05/18/2017    1:52 PM 04/09/2017    9:46 PM  Advanced Directives  Does Patient Have a Medical Advance Directive? No No No No No;Yes No  Would patient like information on creating a medical advance directive? No - Patient declined No - Patient declined No - Patient declined No - Patient declined      Current Medications (verified) Outpatient Encounter Medications as of 01/01/2024  Medication Sig   Accu-Chek FastClix Lancets MISC Use as directed four times a day   aspirin 81 MG tablet Take 81 mg by mouth daily.   carvedilol  (COREG ) 12.5 MG tablet TAKE 1 TABLET BY MOUTH 2 TIMES A DAY * TAKE MORNING DOSE IF SYSTOLIC BLOOD PRESSURE IS 110 OR HIGHER*   ezetimibe  (ZETIA ) 10 MG tablet Take 1 tablet (10 mg total) by mouth daily.   lisinopril  (ZESTRIL ) 5 MG tablet Take 1 tablet (5 mg total) by mouth daily.   metFORMIN  (GLUCOPHAGE ) 500 MG tablet Take 2 tablets (1,000 mg total) by mouth 2 (two) times daily with a meal.   nateglinide  (STARLIX ) 120 MG tablet Take 1 tablet (120 mg total) by mouth 3 (three) times daily  with meals.   triamcinolone  cream (KENALOG ) 0.1 % Apply 1 Application topically 2 (two) times daily as needed (eczema).   No facility-administered encounter medications on file as of 01/01/2024.    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   Qualifier: Diagnosis of  By: Lavonne Prairie, MD, Antoine Bathe     DM (diabetes mellitus) (HCC)    HTN (hypertension)    Hyperlipidemia    Nephrolithiasis    Pacemaker-Medtronic 05/10/2011   Medtronic device implanted January 2010    Vertigo    Past Surgical History:  Procedure Laterality Date   APPENDECTOMY     PACEMAKER PLACEMENT     PPM GENERATOR CHANGEOUT N/A 11/05/2021   Procedure: PPM GENERATOR CHANGEOUT;  Surgeon: Verona Goodwill, MD;  Location: [facility] INVASIVE CV LAB;  Service: Cardiovascular;  Laterality: N/A;   Family History  Problem Relation Age of Onset   Cancer - Colon Father    Social History   Socioeconomic History   Marital status: Married    Spouse name: Not on file   Number of children: Not on file   Years of education: Not on file   Highest education level: Not on file  Occupational History  Not on file  Tobacco Use   Smoking status: Never   Smokeless tobacco: Never  Vaping Use   Vaping status: Never Used  Substance and Sexual Activity   Alcohol use: Not Currently    Comment: occasional   Drug use: No   Sexual activity: Not Currently  Other Topics Concern   Not on file  Social History Narrative   Not on file   Social Drivers of Health   Financial Resource Strain: Low Risk  (01/01/2024)   Overall Financial Resource Strain (CARDIA)    Difficulty of Paying Living Expenses: Not hard at all  Food Insecurity: No Food Insecurity (01/01/2024)   Hunger Vital Sign    Worried About Running Out of Food in the Last Year: Never true     Ran Out of Food in the Last Year: Never true  Transportation Needs: No Transportation Needs (01/01/2024)   PRAPARE - Administrator, Civil Service (Medical): No    Lack of Transportation (Non-Medical): No  Physical Activity: Inactive (01/01/2024)   Exercise Vital Sign    Days of Exercise per Week: 0 days    Minutes of Exercise per Session: 0 min  Stress: No Stress Concern Present (01/01/2024)   [employer] of Occupational Health - Occupational Stress Questionnaire    Feeling of Stress : Not at all  Social Connections: Moderately Isolated (01/01/2024)   Social Connection and Isolation Panel [NHANES]    Frequency of Communication with Friends and Family: More than three times a week    Frequency of Social Gatherings with Friends and Family: More than three times a week    Attends Religious Services: Never    Database administrator or Organizations: No    Attends Engineer, structural: Never    Marital Status: Married    Tobacco Counseling Counseling given: Not Answered    Clinical Intake:  Pre-visit preparation completed: Yes  Pain : No/denies pain     BMI - recorded: 24.7 Nutritional Status: BMI of 19-24  Normal Nutritional Risks: None Diabetes: Yes CBG done?: Yes (CBG 126 Per patient) CBG resulted in Enter/ Edit results?: Yes Did pt. bring in CBG monitor from home?: No  Lab Results  Component Value Date   HGBA1C 7.2 (H) 11/08/2023   HGBA1C 6.9 (A) 11/08/2023   HGBA1C 7.1 (A) 03/28/2023     How often do you need to have someone help you when you read instructions, pamphlets, or other written materials from your doctor or pharmacy?: 1 - Never  Interpreter Needed?: No  Information entered by :: Farris Hong LPN   Activities of Daily Living     01/01/2024    8:16 AM  In your present state of health, do you have any difficulty performing the following activities:  Hearing? 0  Vision? 0  Difficulty concentrating or making decisions? 0   Walking or climbing stairs? 0  Dressing or bathing? 0  Doing errands, shopping? 0  Preparing Food and eating ? N  Using the Toilet? N  In the past six months, have you accidently leaked urine? N  Do you have problems with loss of bowel control? N  Managing your Medications? N  Managing your Finances? N  Housekeeping or managing your Housekeeping? N    Patient Care Team: Aida House, MD as PCP - General (Family Medicine)  I have updated your Care Teams any recent Medical Services you may have received from other providers in the past year.  Assessment:    This is a routine wellness examination for Winter.  Hearing/Vision screen Hearing Screening - Comments:: Denies hearing difficulties   Vision Screening - Comments:: Wears rx glasses - up to date with routine eye exams with  Dr Gaynelle Keeling   Goals Addressed               This Visit's Progress     Stay active (pt-stated)         Depression Screen    01/01/2024    8:15 AM 12/23/2022    9:35 AM 11/08/2022    2:21 PM  PHQ 2/9 Scores  PHQ - 2 Score 0 0 0    Fall Risk     01/01/2024    8:16 AM 05/10/2023   10:10 AM 12/23/2022    9:37 AM 11/08/2022    2:21 PM  Fall Risk   Falls in the past year? 0 0 0 0  Number falls in past yr: 0 0 0 0  Injury with Fall? 0 0 0 0  Risk for fall due to : No Fall Risks No Fall Risks No Fall Risks No Fall Risks  Follow up Falls evaluation completed Falls evaluation completed Falls prevention discussed Falls evaluation completed    MEDICARE RISK AT HOME:  Medicare Risk at Home Any stairs in or around the home?: No If so, are there any without handrails?: No Home free of loose throw rugs in walkways, pet beds, electrical cords, etc?: Yes Adequate lighting in your home to reduce risk of falls?: Yes Life alert?: No Use of a cane, walker or w/c?: No Grab bars in the bathroom?: Yes Shower chair or bench in shower?: Yes Elevated toilet seat or a handicapped toilet?: Yes  TIMED UP  AND GO:  Was the test performed?  Yes  Length of time to ambulate 10 feet: 10 sec Gait steady and fast without use of assistive device  Cognitive Function: 6CIT completed        01/01/2024    8:18 AM 12/23/2022    9:37 AM  6CIT Screen  What Year? 0 points 0 points  What month? 0 points 0 points  What time? 0 points 0 points  Count back from 20 0 points 0 points  Months in reverse 0 points 0 points  Repeat phrase 0 points 0 points  Total Score 0 points 0 points    Immunizations  There is no immunization history on file for this patient.  Screening Tests Health Maintenance  Topic Date Due   Pneumonia Vaccine 15+ Years old (1 of 2 - PCV) Never done   COVID-19 Vaccine (1 - 2024-25 season) Never done   Zoster Vaccines- Shingrix (1 of 2) 02/07/2024 (Originally 04/15/1992)   DTaP/Tdap/Td (1 - Tdap) 11/07/2024 (Originally 04/15/1960)   INFLUENZA VACCINE  03/01/2024   FOOT EXAM  05/09/2024   HEMOGLOBIN A1C  05/09/2024   OPHTHALMOLOGY EXAM  08/04/2024   Diabetic kidney evaluation - eGFR measurement  11/07/2024   Diabetic kidney evaluation - Urine ACR  11/07/2024   Medicare Annual Wellness (AWV)  12/31/2024   HPV VACCINES  Aged Out   Meningococcal B Vaccine  Aged Out    Health Maintenance  Health Maintenance Due  Topic Date Due   Pneumonia Vaccine 23+ Years old (1 of 2 - PCV) Never done   COVID-19 Vaccine (1 - 2024-25 season) Never done   Health Maintenance Items Addressed:   Additional Screening:  Vision Screening: Recommended annual ophthalmology exams for early detection  of glaucoma and other disorders of the eye. Would you like a referral to an eye doctor? No    Dental Screening: Recommended annual dental exams for proper oral hygiene  Community Resource Referral / Chronic Care Management: CRR required this visit?  No   CCM required this visit?  No   Plan:    I have personally reviewed and noted the following in the patient's chart:   Medical and social  history Use of alcohol, tobacco or illicit drugs  Current medications and supplements including opioid prescriptions. Patient is not currently taking opioid prescriptions. Functional ability and status Nutritional status Physical activity Advanced directives List of other physicians Hospitalizations, surgeries, and ER visits in previous 12 months Vitals Screenings to include cognitive, depression, and falls Referrals and appointments  In addition, I have reviewed and discussed with patient certain preventive protocols, quality metrics, and best practice recommendations. A written personalized care plan for preventive services as well as general preventive health recommendations were provided to patient.   Dewayne Ford, LPN   08/06/1094   After Visit Summary: (In Person-Printed) AVS printed and given to the patient  Notes: Nothing significant to report at this time.

## 2024-01-27 ENCOUNTER — Other Ambulatory Visit: Payer: Self-pay | Admitting: Internal Medicine

## 2024-01-29 NOTE — Telephone Encounter (Signed)
 Rx refill sent to pharmacy.

## 2024-02-15 ENCOUNTER — Ambulatory Visit: Payer: Medicare Other

## 2024-02-15 DIAGNOSIS — I442 Atrioventricular block, complete: Secondary | ICD-10-CM | POA: Diagnosis not present

## 2024-02-15 LAB — CUP PACEART REMOTE DEVICE CHECK
Battery Remaining Longevity: 117 mo
Battery Voltage: 3.01 V
Brady Statistic AP VP Percent: 69.34 %
Brady Statistic AP VS Percent: 0.08 %
Brady Statistic AS VP Percent: 28.52 %
Brady Statistic AS VS Percent: 2.05 %
Brady Statistic RA Percent Paced: 69.77 %
Brady Statistic RV Percent Paced: 97.87 %
Date Time Interrogation Session: 20250717034824
Implantable Lead Connection Status: 753985
Implantable Lead Connection Status: 753985
Implantable Lead Implant Date: 20100125
Implantable Lead Implant Date: 20100125
Implantable Lead Location: 753859
Implantable Lead Location: 753860
Implantable Lead Model: 5076
Implantable Lead Model: 5076
Implantable Pulse Generator Implant Date: 20230407
Lead Channel Impedance Value: 342 Ohm
Lead Channel Impedance Value: 380 Ohm
Lead Channel Impedance Value: 475 Ohm
Lead Channel Impedance Value: 532 Ohm
Lead Channel Pacing Threshold Amplitude: 0.5 V
Lead Channel Pacing Threshold Amplitude: 0.75 V
Lead Channel Pacing Threshold Pulse Width: 0.4 ms
Lead Channel Pacing Threshold Pulse Width: 0.4 ms
Lead Channel Sensing Intrinsic Amplitude: 14.875 mV
Lead Channel Sensing Intrinsic Amplitude: 14.875 mV
Lead Channel Sensing Intrinsic Amplitude: 2 mV
Lead Channel Sensing Intrinsic Amplitude: 2 mV
Lead Channel Setting Pacing Amplitude: 1.5 V
Lead Channel Setting Pacing Amplitude: 2 V
Lead Channel Setting Pacing Pulse Width: 0.4 ms
Lead Channel Setting Sensing Sensitivity: 2 mV
Zone Setting Status: 755011

## 2024-02-19 ENCOUNTER — Emergency Department (HOSPITAL_COMMUNITY)

## 2024-02-19 ENCOUNTER — Inpatient Hospital Stay (HOSPITAL_COMMUNITY)
Admission: EM | Admit: 2024-02-19 | Discharge: 2024-02-22 | DRG: 062 | Disposition: A | Discharge status: Home / Self Care | Attending: Neurology | Admitting: Neurology

## 2024-02-19 ENCOUNTER — Inpatient Hospital Stay (HOSPITAL_COMMUNITY)

## 2024-02-19 DIAGNOSIS — R29705 NIHSS score 5: Secondary | ICD-10-CM | POA: Diagnosis present

## 2024-02-19 DIAGNOSIS — R278 Other lack of coordination: Secondary | ICD-10-CM | POA: Diagnosis not present

## 2024-02-19 DIAGNOSIS — I160 Hypertensive urgency: Secondary | ICD-10-CM

## 2024-02-19 DIAGNOSIS — E1165 Type 2 diabetes mellitus with hyperglycemia: Secondary | ICD-10-CM | POA: Diagnosis present

## 2024-02-19 DIAGNOSIS — Z950 Presence of cardiac pacemaker: Secondary | ICD-10-CM

## 2024-02-19 DIAGNOSIS — R29818 Other symptoms and signs involving the nervous system: Secondary | ICD-10-CM | POA: Diagnosis not present

## 2024-02-19 DIAGNOSIS — E785 Hyperlipidemia, unspecified: Secondary | ICD-10-CM | POA: Diagnosis present

## 2024-02-19 DIAGNOSIS — I6389 Other cerebral infarction: Secondary | ICD-10-CM | POA: Diagnosis not present

## 2024-02-19 DIAGNOSIS — D72829 Elevated white blood cell count, unspecified: Secondary | ICD-10-CM | POA: Diagnosis present

## 2024-02-19 DIAGNOSIS — I639 Cerebral infarction, unspecified: Principal | ICD-10-CM | POA: Diagnosis present

## 2024-02-19 DIAGNOSIS — I6522 Occlusion and stenosis of left carotid artery: Secondary | ICD-10-CM | POA: Diagnosis not present

## 2024-02-19 DIAGNOSIS — R29702 NIHSS score 2: Secondary | ICD-10-CM | POA: Diagnosis not present

## 2024-02-19 DIAGNOSIS — Z888 Allergy status to other drugs, medicaments and biological substances status: Secondary | ICD-10-CM

## 2024-02-19 DIAGNOSIS — R4701 Aphasia: Secondary | ICD-10-CM | POA: Diagnosis not present

## 2024-02-19 DIAGNOSIS — R410 Disorientation, unspecified: Secondary | ICD-10-CM | POA: Diagnosis not present

## 2024-02-19 DIAGNOSIS — Z79899 Other long term (current) drug therapy: Secondary | ICD-10-CM | POA: Diagnosis not present

## 2024-02-19 DIAGNOSIS — I10 Essential (primary) hypertension: Secondary | ICD-10-CM | POA: Diagnosis present

## 2024-02-19 DIAGNOSIS — R9082 White matter disease, unspecified: Secondary | ICD-10-CM | POA: Diagnosis not present

## 2024-02-19 DIAGNOSIS — Z7902 Long term (current) use of antithrombotics/antiplatelets: Secondary | ICD-10-CM | POA: Diagnosis not present

## 2024-02-19 DIAGNOSIS — I6350 Cerebral infarction due to unspecified occlusion or stenosis of unspecified cerebral artery: Secondary | ICD-10-CM | POA: Diagnosis not present

## 2024-02-19 DIAGNOSIS — H5340 Unspecified visual field defects: Secondary | ICD-10-CM | POA: Diagnosis present

## 2024-02-19 DIAGNOSIS — E871 Hypo-osmolality and hyponatremia: Secondary | ICD-10-CM | POA: Diagnosis not present

## 2024-02-19 DIAGNOSIS — Z882 Allergy status to sulfonamides status: Secondary | ICD-10-CM | POA: Diagnosis not present

## 2024-02-19 DIAGNOSIS — Z7984 Long term (current) use of oral hypoglycemic drugs: Secondary | ICD-10-CM | POA: Diagnosis not present

## 2024-02-19 DIAGNOSIS — R29810 Facial weakness: Secondary | ICD-10-CM | POA: Diagnosis present

## 2024-02-19 DIAGNOSIS — R339 Retention of urine, unspecified: Secondary | ICD-10-CM | POA: Diagnosis not present

## 2024-02-19 DIAGNOSIS — I700 Atherosclerosis of aorta: Secondary | ICD-10-CM | POA: Diagnosis not present

## 2024-02-19 DIAGNOSIS — G319 Degenerative disease of nervous system, unspecified: Secondary | ICD-10-CM | POA: Diagnosis not present

## 2024-02-19 DIAGNOSIS — R9089 Other abnormal findings on diagnostic imaging of central nervous system: Secondary | ICD-10-CM | POA: Diagnosis not present

## 2024-02-19 DIAGNOSIS — Z7982 Long term (current) use of aspirin: Secondary | ICD-10-CM | POA: Diagnosis not present

## 2024-02-19 DIAGNOSIS — E1151 Type 2 diabetes mellitus with diabetic peripheral angiopathy without gangrene: Secondary | ICD-10-CM | POA: Diagnosis not present

## 2024-02-19 DIAGNOSIS — I6523 Occlusion and stenosis of bilateral carotid arteries: Secondary | ICD-10-CM | POA: Diagnosis not present

## 2024-02-19 DIAGNOSIS — I6782 Cerebral ischemia: Secondary | ICD-10-CM | POA: Diagnosis not present

## 2024-02-19 LAB — I-STAT CHEM 8, ED
BUN: 11 mg/dL (ref 8–23)
Calcium, Ion: 1.12 mmol/L — ABNORMAL LOW (ref 1.15–1.40)
Chloride: 98 mmol/L (ref 98–111)
Creatinine, Ser: 0.7 mg/dL (ref 0.61–1.24)
Glucose, Bld: 127 mg/dL — ABNORMAL HIGH (ref 70–99)
HCT: 45 % (ref 39.0–52.0)
Hemoglobin: 15.3 g/dL (ref 13.0–17.0)
Potassium: 4.6 mmol/L (ref 3.5–5.1)
Sodium: 131 mmol/L — ABNORMAL LOW (ref 135–145)
TCO2: 23 mmol/L (ref 22–32)

## 2024-02-19 LAB — CBG MONITORING, ED: Glucose-Capillary: 143 mg/dL — ABNORMAL HIGH (ref 70–99)

## 2024-02-19 LAB — DIFFERENTIAL
Abs Immature Granulocytes: 0.06 K/uL (ref 0.00–0.07)
Basophils Absolute: 0.1 K/uL (ref 0.0–0.1)
Basophils Relative: 0 %
Eosinophils Absolute: 0.1 K/uL (ref 0.0–0.5)
Eosinophils Relative: 0 %
Immature Granulocytes: 0 %
Lymphocytes Relative: 9 %
Lymphs Abs: 1.4 K/uL (ref 0.7–4.0)
Monocytes Absolute: 1.3 K/uL — ABNORMAL HIGH (ref 0.1–1.0)
Monocytes Relative: 9 %
Neutro Abs: 12.2 K/uL — ABNORMAL HIGH (ref 1.7–7.7)
Neutrophils Relative %: 82 %

## 2024-02-19 LAB — CBC
HCT: 43.5 % (ref 39.0–52.0)
Hemoglobin: 15.2 g/dL (ref 13.0–17.0)
MCH: 30.8 pg (ref 26.0–34.0)
MCHC: 34.9 g/dL (ref 30.0–36.0)
MCV: 88.2 fL (ref 80.0–100.0)
Platelets: 235 K/uL (ref 150–400)
RBC: 4.93 MIL/uL (ref 4.22–5.81)
RDW: 12.5 % (ref 11.5–15.5)
WBC: 15 K/uL — ABNORMAL HIGH (ref 4.0–10.5)
nRBC: 0 % (ref 0.0–0.2)

## 2024-02-19 LAB — RAPID URINE DRUG SCREEN, HOSP PERFORMED
Amphetamines: NOT DETECTED
Barbiturates: NOT DETECTED
Benzodiazepines: NOT DETECTED
Cocaine: NOT DETECTED
Opiates: NOT DETECTED
Tetrahydrocannabinol: NOT DETECTED

## 2024-02-19 LAB — COMPREHENSIVE METABOLIC PANEL WITH GFR
ALT: 16 U/L (ref 0–44)
AST: 17 U/L (ref 15–41)
Albumin: 3.8 g/dL (ref 3.5–5.0)
Alkaline Phosphatase: 89 U/L (ref 38–126)
Anion gap: 12 (ref 5–15)
BUN: 9 mg/dL (ref 8–23)
CO2: 20 mmol/L — ABNORMAL LOW (ref 22–32)
Calcium: 9.1 mg/dL (ref 8.9–10.3)
Chloride: 97 mmol/L — ABNORMAL LOW (ref 98–111)
Creatinine, Ser: 0.7 mg/dL (ref 0.61–1.24)
GFR, Estimated: >60 mL/min (ref >60)
Glucose, Bld: 157 mg/dL — ABNORMAL HIGH (ref 70–99)
Potassium: 4.8 mmol/L (ref 3.5–5.1)
Sodium: 129 mmol/L — ABNORMAL LOW (ref 135–145)
Total Bilirubin: 1.1 mg/dL (ref 0.0–1.2)
Total Protein: 6.7 g/dL (ref 6.5–8.1)

## 2024-02-19 LAB — URINALYSIS, ROUTINE W REFLEX MICROSCOPIC
Bilirubin Urine: NEGATIVE
Glucose, UA: 50 mg/dL — AB
Hgb urine dipstick: NEGATIVE
Ketones, ur: 20 mg/dL — AB
Leukocytes,Ua: NEGATIVE
Nitrite: NEGATIVE
Protein, ur: NEGATIVE mg/dL
Specific Gravity, Urine: 1.017 (ref 1.005–1.030)
pH: 8 (ref 5.0–8.0)

## 2024-02-19 LAB — ETHANOL: Alcohol, Ethyl (B): <15 mg/dL (ref <15)

## 2024-02-19 LAB — PROTIME-INR
INR: 1.1 (ref 0.8–1.2)
Prothrombin Time: 14.7 s (ref 11.4–15.2)

## 2024-02-19 LAB — APTT: aPTT: 31 s (ref 24–36)

## 2024-02-19 MED ORDER — LABETALOL HCL 5 MG/ML IV SOLN
10.0000 mg | Freq: Once | INTRAVENOUS | Status: AC
  Administered 2024-02-19: 10 mg via INTRAVENOUS

## 2024-02-19 MED ORDER — ORAL CARE MOUTH RINSE
15.0000 mL | OROMUCOSAL | Status: DC
  Administered 2024-02-20 – 2024-02-21 (×5): 15 mL via OROMUCOSAL

## 2024-02-19 MED ORDER — CLEVIDIPINE BUTYRATE 0.5 MG/ML IV EMUL
INTRAVENOUS | Status: AC
  Filled 2024-02-19: qty 50

## 2024-02-19 MED ORDER — STROKE: EARLY STAGES OF RECOVERY BOOK: Freq: Once | Status: AC

## 2024-02-19 MED ORDER — CLEVIDIPINE BUTYRATE 0.5 MG/ML IV EMUL
0.0000 mg/h | INTRAVENOUS | Status: DC
  Administered 2024-02-19: 2 mg/h via INTRAVENOUS
  Administered 2024-02-19: 8 mg/h via INTRAVENOUS
  Filled 2024-02-19: qty 100

## 2024-02-19 MED ORDER — ACETAMINOPHEN 160 MG/5ML PO SOLN: 650.0000 mg | ORAL | Status: DC | PRN

## 2024-02-19 MED ORDER — ACETAMINOPHEN 650 MG RE SUPP: 650.0000 mg | RECTAL | Status: DC | PRN

## 2024-02-19 MED ORDER — PANTOPRAZOLE SODIUM 40 MG IV SOLR
40.0000 mg | Freq: Every day | INTRAVENOUS | Status: DC
  Administered 2024-02-20: 40 mg via INTRAVENOUS
  Filled 2024-02-19: qty 10

## 2024-02-19 MED ORDER — TENECTEPLASE FOR STROKE
0.2500 mg/kg | PACK | Freq: Once | INTRAVENOUS | Status: AC
  Administered 2024-02-19: 20 mg via INTRAVENOUS
  Filled 2024-02-19: qty 10

## 2024-02-19 MED ORDER — SENNOSIDES-DOCUSATE SODIUM 8.6-50 MG PO TABS: 1.0000 | ORAL_TABLET | Freq: Every evening | ORAL | Status: DC | PRN

## 2024-02-19 MED ORDER — SODIUM CHLORIDE 0.9 % IV SOLN: INTRAVENOUS | Status: DC

## 2024-02-19 MED ORDER — CHLORHEXIDINE GLUCONATE CLOTH 2 % EX PADS
6.0000 | MEDICATED_PAD | Freq: Every day | CUTANEOUS | Status: DC
  Administered 2024-02-20 – 2024-02-21 (×2): 6 via TOPICAL

## 2024-02-19 MED ORDER — ACETAMINOPHEN 325 MG PO TABS
650.0000 mg | ORAL_TABLET | ORAL | Status: DC | PRN
  Administered 2024-02-20 – 2024-02-21 (×4): 650 mg via ORAL
  Filled 2024-02-19 (×4): qty 2

## 2024-02-19 MED ORDER — ORAL CARE MOUTH RINSE: 15.0000 mL | OROMUCOSAL | Status: DC | PRN

## 2024-02-19 MED ORDER — IOHEXOL 350 MG/ML SOLN
100.0000 mL | Freq: Once | INTRAVENOUS | Status: AC | PRN
  Administered 2024-02-19: 100 mL via INTRAVENOUS

## 2024-02-19 NOTE — Code Documentation (Addendum)
 Stroke Response Nurse Documentation Code Documentation  Nathan [name] is a 83 y.o. male arriving to [facility]  via [facility] on 02/19/2024 with past medical hx of DM, pacemaker, HTN, HLD, vertigo. On No antithrombotic. Code stroke was activated by EMS.   Patient from home where he was LKW at 1730 and now complaining of expressive aphasia. Patient went down for a nap at 17:30, when woke up family stated he was confused. Upon EMS arrival they noticed expressive aphasia and vision problems.   Stroke team at the bedside on patient arrival. Labs drawn and patient cleared for CT by EDP. Patient to CT with team. NIHSS 5, see documentation for details and code stroke times. Patient with disoriented, left hemianopia, Upper limb ataxia, and Expressive aphasia  on exam. The following imaging was completed:  CT Head and CTA. Patient is a candidate for IV Thrombolytic due to no bleed noted on imaging. Patient is not a candidate for IR due to no LVO noted.   Care Plan: Transfer to ICU. NIH and VS Q15 x2hours, Q30 x6hours, Q1 x16hours. Monitor for signs of bleeding, headache, nausea/vomiting, neuro changes, angioedema. Keep SBP <180 and >120, DBP <105.   Process Delays Noted: Delays in administering TNK due to hypertension.   Bedside handoff with ED RN Roddarria.    Delon Daub  Stroke Response RN

## 2024-02-19 NOTE — Progress Notes (Signed)
 PHARMACIST CODE STROKE RESPONSE  Notified to mix TNK at 2023 by Dr. Arora. BP at this time to high to administer TNK so prepared labetalol  and clevidipine  for administration. TNK preparation completed at 2026 BP ok to give at 2037 >> given at this time  TNK dose = 20 mg IV over 5 seconds  Issues/delays encountered (if applicable):  Hall scale obstructed by plumbing work (unavoidable by them). No working bed scales available. Verbal by Dr. Arora to use most recent recorded weight in the chart of 80 kg.  Dorn LITTIE Buttner 02/19/24 8:39 PM

## 2024-02-19 NOTE — ED Notes (Signed)
 Foley insertion attempted after bladder scanner reading of per MD. Insertion unsuccessful. Oncoming nurse and MD made aware

## 2024-02-19 NOTE — ED Triage Notes (Signed)
 [facility] d/t code  stroke. Per EMS pts family states pt woke up from nap with difficulty speaking. Pt ambulatory per EMS but endorsed left sided visual impairment. LWK 530pm. Pt unable to answer questions appropriately.

## 2024-02-19 NOTE — Plan of Care (Signed)
 Straight cath with >1L urine. UA negative. Plan as before   A Keigen Caddell, MD Neurology

## 2024-02-19 NOTE — H&P (Signed)
 NEUROLOGY H&P NOTE   Date of service: February 19, 2024 Patient Name: Nathan [name] MRN:  [id number] DOB:  1940-12-22 Chief Complaint: Confusion, expressive aphasia  History of Present Illness  Nathan [name] is a 83 y.o. male with hx of complete AV block status post pacemaker, diabetes, hypertension, hyperlipidemia presenting for evaluation of sudden onset of difficulty with words and confusion. Last known well 5:30 PM when he went for a nap and when he woke up, his speech would not make sense.  EMS was called who noted aphasia and left visual field deficits for which a code stroke was activated. He was evaluated at the [facility].  He was extremely hypertensive and required labetalol  x 1 as well as starting Cleviprex  drip before his blood pressure got into goal for treatment with TNK. He was eventually administered TNK and admission orders for neuro ICU admission and post thrombolysis vitals and neurochecks were placed.    Last known well: 5:30 PM Modified rankin score: 1-No significant post stroke disability and can perform usual duties with stroke symptoms IV Thrombolysis: Administered Thrombectomy: No-no LVO NIHSS components Score: Comment  1a Level of Conscious 0[x]  1[]  2[]  3[]      1b LOC Questions 0[]  1[]  2[x]       1c LOC Commands 0[x]  1[]  2[]       2 Best Gaze 0[]  1[]  2[]       3 Visual 0[]  1[x]  2[]  3[]      4 Facial Palsy 0[x]  1[]  2[]  3[]      5a Motor Arm - left 0[x]  1[]  2[]  3[]  4[]  UN[]    5b Motor Arm - Right 0[x]  1[]  2[]  3[]  4[]  UN[]    6a Motor Leg - Left 0[x]  1[]  2[]  3[]  4[]  UN[]    6b Motor Leg - Right 0[x]  1[]  2[]  3[]  4[]  UN[]    7 Limb Ataxia 0[]  1[x]  2[]  UN[]      8 Sensory 0[x]  1[]  2[]  UN[]      9 Best Language 0[]  1[x]  2[]  3[]      10 Dysarthria 0[x]  1[]  2[]  UN[]      11 Extinct. and Inattention 0[x]  1[]  2[]       TOTAL: 5      ROS  Comprehensive ROS performed and pertinent positives documented in the HPI  Past History   Past Medical History:  Diagnosis Date    Atrioventricular block, 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   Qualifier: Diagnosis of  By: Lavona, MD, CODY Lynwood     DM (diabetes mellitus) (HCC)    HTN (hypertension)    Hyperlipidemia    Nephrolithiasis    Pacemaker-Medtronic 05/10/2011   Medtronic device implanted January 2010    Vertigo    Past Surgical History:  Procedure Laterality Date   APPENDECTOMY     PACEMAKER PLACEMENT     PPM GENERATOR CHANGEOUT N/A 11/05/2021   Procedure: PPM GENERATOR CHANGEOUT;  Surgeon: Fernande Elspeth BROCKS, MD;  Location: [facility] INVASIVE CV LAB;  Service: Cardiovascular;  Laterality: N/A;   Family History  Problem Relation Age of Onset   Cancer - Colon Father    Social History   Socioeconomic History   Marital status: Married    Spouse name: Not on file   Number of children: Not on file   Years of education: Not on file   Highest education level: Not on file  Occupational History   Not on file  Tobacco Use   Smoking status: Never   Smokeless tobacco: Never  Vaping Use   Vaping status: Never Used  Substance and Sexual Activity  Alcohol use: Not Currently    Comment: occasional   Drug use: No   Sexual activity: Not Currently  Other Topics Concern   Not on file  Social History Narrative   Not on file   Social Drivers of Health   Financial Resource Strain: Low Risk  (01/01/2024)   Overall Financial Resource Strain (CARDIA)    Difficulty of Paying Living Expenses: Not hard at all  Food Insecurity: No Food Insecurity (01/01/2024)   Hunger Vital Sign    Worried About Running Out of Food in the Last Year: Never true    Ran Out of Food in the Last Year: Never true  Transportation Needs: No Transportation Needs (01/01/2024)   PRAPARE - Administrator, Civil Service (Medical): No    Lack of Transportation (Non-Medical): No  Physical Activity: Inactive (01/01/2024)   Exercise Vital Sign    Days of Exercise per Week: 0 days    Minutes of Exercise per Session: 0 min  Stress: No Stress  Concern Present (01/01/2024)   [employer] of Occupational Health - Occupational Stress Questionnaire    Feeling of Stress : Not at all  Social Connections: Moderately Isolated (01/01/2024)   Social Connection and Isolation Panel    Frequency of Communication with Friends and Family: More than three times a week    Frequency of Social Gatherings with Friends and Family: More than three times a week    Attends Religious Services: Never    Database administrator or Organizations: No    Attends Engineer, structural: Never    Marital Status: Married   Allergies  Allergen Reactions   Cyclobenzaprine Other (See Comments)    Unknown   Niacin Other (See Comments)    REACTION: flushing   Niacin And Related Other (See Comments)    Eye lids itch/ symptoms of poison ivy   Olmesartan Swelling    Prior Benicar in 2005-2011; stopped due to low BP   Pioglitazone Other (See Comments)    Achilles pain; ocular symptoms (twice)   Sulfonamide Derivatives Other (See Comments)    Unknown reaction to medication   Valsartan Other (See Comments)    Started in 2011; 80 mg daily; he stopped in 7/15 due to dizziness despite dose reduction    Medications   Current Facility-Administered Medications:    [START ON 02/20/2024]  stroke: early stages of recovery book, , Does not apply, Once, Voncile Isles, MD   0.9 %  sodium chloride  infusion, , Intravenous, Continuous, Helen Winterhalter, MD   acetaminophen  (TYLENOL ) tablet 650 mg, 650 mg, Oral, Q4H PRN **OR** acetaminophen  (TYLENOL ) 160 MG/5ML solution 650 mg, 650 mg, Per Tube, Q4H PRN **OR** acetaminophen  (TYLENOL ) suppository 650 mg, 650 mg, Rectal, Q4H PRN, Rubbie Goostree, MD   [COMPLETED] labetalol  (NORMODYNE ) injection 10 mg, 10 mg, Intravenous, Once, 10 mg at 02/19/24 2026 **AND** clevidipine  (CLEVIPREX ) infusion 0.5 mg/mL, 0-21 mg/hr, Intravenous, Continuous, Voncile Isles, MD, Last Rate: 16 mL/hr at 02/19/24 2034, 8 mg/hr at 02/19/24 2034    pantoprazole  (PROTONIX ) injection 40 mg, 40 mg, Intravenous, QHS, Haily Caley, MD   senna-docusate (Senokot-S) tablet 1 tablet, 1 tablet, Oral, QHS PRN, Wendell Fiebig, MD  Current Outpatient Medications:    Accu-Chek FastClix Lancets MISC, Use as directed four times a day, Disp: 400 each, Rfl: 6   aspirin  81 MG tablet, Take 81 mg by mouth daily., Disp: , Rfl:    carvedilol  (COREG ) 12.5 MG tablet, TAKE 1 TABLET BY MOUTH 2 TIMES  A DAY **TAKE MORNING DOSE IF SYSTOLIC BLOOD PRESSURE IS 110 OR HIGHER** (Patient taking differently: Take 12.5 mg by mouth 2 (two) times daily with a meal.), Disp: 60 tablet, Rfl: 0   ezetimibe  (ZETIA ) 10 MG tablet, Take 1 tablet (10 mg total) by mouth daily., Disp: 90 tablet, Rfl: 3   lisinopril  (ZESTRIL ) 5 MG tablet, Take 1 tablet (5 mg total) by mouth daily., Disp: 90 tablet, Rfl: 1   metFORMIN  (GLUCOPHAGE ) 500 MG tablet, Take 2 tablets (1,000 mg total) by mouth 2 (two) times daily with a meal., Disp: 360 tablet, Rfl: 3   nateglinide  (STARLIX ) 120 MG tablet, Take 1 tablet (120 mg total) by mouth 3 (three) times daily with meals., Disp: 270 tablet, Rfl: 3   triamcinolone  cream (KENALOG ) 0.1 %, Apply 1 Application topically 2 (two) times daily as needed (eczema)., Disp: 45 g, Rfl: 2   Vitals   Vitals:   02/19/24 2107 02/19/24 2113 02/19/24 2115 02/19/24 2122  BP:   (!) 147/63 (!) 148/77  Pulse: 82  82 85  Resp: 20  20 18   Temp:    98.7 F (37.1 C)  SpO2: 96%  100% 100%  Weight:  82.6 kg    Height:  6' (1.829 m)       Body mass index is 24.7 kg/m.  Physical Exam   General: Well-developed well-nourished man in no acute distress HEENT: Normocephalic atraumatic Lungs: Clear Cardiovascular: Regular rate rhythm Neurological exam Awake alert oriented to self. Unable to tell me the correct month or his age correctly. Has a word salad when trying to speak sentences. Naming, repetition and fluency impaired.  Comprehension for simple commands appears intact but  comprehension for multistep commands is impaired. No gross dysarthria Cranial nerves II to XII: Pupils equal round react light, extraocular movements intact, visual field examination reveals left-sided constriction of visual fields on confrontation exam, facial symmetry is preserved, tongue and palate midline. Motor examination with no drift Sensation intact to light touch Coordination examination with mild left-sided dysmetria in the upper extremity.  Labs   CBC:  Recent Labs  Lab 02/19/24 2016 02/19/24 2059  WBC  --  15.0*  NEUTROABS  --  12.2*  HGB 15.3 15.2  HCT 45.0 43.5  MCV  --  88.2  PLT  --  235   Basic Metabolic Panel:  Lab Results  Component Value Date   NA 131 (L) 02/19/2024   K 4.6 02/19/2024   CO2 29 11/08/2023   GLUCOSE 127 (H) 02/19/2024   BUN 11 02/19/2024   CREATININE 0.70 02/19/2024   CALCIUM  9.2 11/08/2023   GFRNONAA >60 08/03/2021   GFRAA >60 05/18/2017   Lipid Panel:  Lab Results  Component Value Date   LDLCALC 107 (H) 11/08/2023   HgbA1c:  Lab Results  Component Value Date   HGBA1C 7.2 (H) 11/08/2023   INR  Lab Results  Component Value Date   INR 1.1 02/19/2024   APTT  Lab Results  Component Value Date   APTT 31 02/19/2024     CT Head without contrast(Personally reviewed): No acute changes.  CT angio Head and Neck with contrast(Personally reviewed): Negative for ELVO  Assessment   Nathan [name] is a 83 y.o. male with above past medical history who presents with sudden onset of speech difficulty-specifically expressive aphasia and left-sided visual field constriction and left-sided ataxia.  He is right-handed so I am unsure if all his symptoms are explained by stroke in 1 vascular territory but  given his cardiac history, multiple territories may be involved from a cardio [place] source. Other differentials include hypertensive urgency. Does have some leukocytosis and hyponatremia-metabolic causes for stroke mimic could also be  entertained in the differentials I had a discussion in detail with the wife regarding the sudden onset of symptoms pointing towards this being a stroke.  We discussed the risks benefits and alternatives of IV TNKase  and she agreed to proceed over the phone. He was administered IV thrombolysis after detailed discussion with the wife, as well as after controlling his blood pressures which caused some delay in getting the blood pressure to the goal  Impression: Acute ischemic stroke, etiology under investigation, status post IV thrombolysis  Recommendations  Admit to neuro ICU Post thrombolysis neurochecks and vitals No anti-platelets of anticoagulants for 24 hours from TNK unless 24-hour imaging is negative for bleed. SCDs for DVT prophylaxis 2D echo, A1c, lipid panel Normal saline-75 cc an hour. Mild hyponatremia-saline as above.  Recheck BMP in the morning Check UA and chest x-ray.  Also trend fever curve. No need for antibiotics at this time unless above tests are concerning for infection. Therapy assessments once able to. 24-hour CT head ordered for 8 PM tomorrow Stroke team will continue to follow  ______________________________________________________________________   Signed, Eligio Lav, MD [facility]  Risks, benefits and alternatives of IVT discussed with wife on phone and they agreed. CTH personally reviewed prior to TNK administration  Delays -Needed to repeat CT head due to artifacts on initial CT (poor CT quality) -HTN needing meds as in [facility]    CRITICAL CARE ATTESTATION Performed by: Eligio Lav, MD Total critical care time: 45 minutes Critical care time was exclusive of separately billable procedures and treating other patients and/or supervising APPs/Residents/Students Critical care was necessary to treat or prevent imminent or life-threatening deterioration. This patient is critically ill and at significant risk for neurological worsening and/or  death and care requires constant monitoring. Critical care was time spent personally by me on the following activities: development of treatment plan with patient and/or surrogate as well as nursing, discussions with consultants, evaluation of patient's response to treatment, examination of patient, obtaining history from patient or surrogate, ordering and performing treatments and interventions, ordering and review of laboratory studies, ordering and review of radiographic studies, pulse oximetry, re-evaluation of patient's condition, participation in multidisciplinary rounds and medical decision making of high complexity in the care of this patient.

## 2024-02-19 NOTE — ED Notes (Signed)
 CCMD contacted for cardiac monitoring. Monitoring successful.

## 2024-02-20 ENCOUNTER — Inpatient Hospital Stay (HOSPITAL_COMMUNITY)

## 2024-02-20 DIAGNOSIS — I6523 Occlusion and stenosis of bilateral carotid arteries: Secondary | ICD-10-CM | POA: Diagnosis not present

## 2024-02-20 DIAGNOSIS — E1151 Type 2 diabetes mellitus with diabetic peripheral angiopathy without gangrene: Secondary | ICD-10-CM

## 2024-02-20 DIAGNOSIS — R29702 NIHSS score 2: Secondary | ICD-10-CM | POA: Diagnosis not present

## 2024-02-20 DIAGNOSIS — E785 Hyperlipidemia, unspecified: Secondary | ICD-10-CM

## 2024-02-20 DIAGNOSIS — I6389 Other cerebral infarction: Secondary | ICD-10-CM

## 2024-02-20 DIAGNOSIS — I6350 Cerebral infarction due to unspecified occlusion or stenosis of unspecified cerebral artery: Secondary | ICD-10-CM

## 2024-02-20 DIAGNOSIS — I639 Cerebral infarction, unspecified: Secondary | ICD-10-CM | POA: Diagnosis not present

## 2024-02-20 LAB — ECHOCARDIOGRAM COMPLETE
Area-P 1/2: 1.81 cm2
Height: 72 in
MV VTI: 2.05 cm2
S' Lateral: 2.7 cm
Weight: 2804.25 [oz_av]

## 2024-02-20 LAB — LIPID PANEL
Cholesterol: 170 mg/dL (ref 0–200)
HDL: 36 mg/dL — ABNORMAL LOW (ref >40)
LDL Cholesterol: 118 mg/dL — ABNORMAL HIGH (ref 0–99)
Total CHOL/HDL Ratio: 4.7 ratio
Triglycerides: 79 mg/dL (ref <150)
VLDL: 16 mg/dL (ref 0–40)

## 2024-02-20 LAB — GLUCOSE, CAPILLARY
Glucose-Capillary: 177 mg/dL — ABNORMAL HIGH (ref 70–99)
Glucose-Capillary: 183 mg/dL — ABNORMAL HIGH (ref 70–99)

## 2024-02-20 LAB — MRSA NEXT GEN BY PCR, NASAL: MRSA by PCR Next Gen: NOT DETECTED

## 2024-02-20 MED ORDER — ONDANSETRON HCL 4 MG/2ML IJ SOLN: 4.0000 mg | Freq: Four times a day (QID) | INTRAMUSCULAR | Status: DC | PRN

## 2024-02-20 MED ORDER — TAMSULOSIN HCL 0.4 MG PO CAPS
0.4000 mg | ORAL_CAPSULE | Freq: Every day | ORAL | Status: AC
  Administered 2024-02-20 – 2024-02-22 (×3): 0.4 mg via ORAL
  Filled 2024-02-20 (×3): qty 1

## 2024-02-20 MED ORDER — ATORVASTATIN CALCIUM 40 MG PO TABS
40.0000 mg | ORAL_TABLET | Freq: Every day | ORAL | Status: DC
  Administered 2024-02-20 – 2024-02-22 (×3): 40 mg via ORAL
  Filled 2024-02-20 (×3): qty 1

## 2024-02-20 MED ORDER — PANTOPRAZOLE SODIUM 40 MG PO TBEC
40.0000 mg | DELAYED_RELEASE_TABLET | Freq: Every day | ORAL | Status: DC
  Administered 2024-02-20 – 2024-02-21 (×2): 40 mg via ORAL
  Filled 2024-02-20 (×2): qty 1

## 2024-02-20 MED ORDER — LIDOCAINE HCL URETHRAL/MUCOSAL 2 % EX GEL
1.0000 | Freq: Once | CUTANEOUS | Status: DC
  Filled 2024-02-20: qty 11

## 2024-02-20 MED ORDER — PERFLUTREN LIPID MICROSPHERE
1.0000 mL | INTRAVENOUS | Status: AC | PRN
  Administered 2024-02-20: 5 mL via INTRAVENOUS

## 2024-02-20 MED ORDER — EZETIMIBE 10 MG PO TABS
10.0000 mg | ORAL_TABLET | Freq: Every day | ORAL | Status: DC
  Administered 2024-02-20 – 2024-02-22 (×3): 10 mg via ORAL
  Filled 2024-02-20 (×3): qty 1

## 2024-02-20 NOTE — Progress Notes (Signed)
 Patient repositioning retaken

## 2024-02-20 NOTE — Evaluation (Signed)
 Physical Therapy Evaluation Patient Details Name: Nathan [name] MRN: [id number] DOB: Jun 23, 1941 Today's Date: 02/20/2024  History of Present Illness  83 yo male presents to [facility] ED on 02/19/2024 with sudden onset of difficulty with words and confusion. TNK administered 7/21 at 2000. CT showed no large vessel occlusion and diminutive appearance of multiple M2 branches of the left MCA w/o occlusion. PMH: complete AV block s/p pacemaker, DM, HTN, HLD.  Clinical Impression   Pt presents with impaired higher level standing balance, decreased activity tolerance vs baseline, and L visual field deficits with inattention. Pt to benefit from acute PT to address deficits. Pt ambulated good hallway distance without AD and close guard to supervision, needs cuing to attend to L side environment. Anticipate follow up PT needs in OP setting. PT to progress mobility as tolerated, and will continue to follow acutely.          If plan is discharge home, recommend the following: A little help with walking and/or transfers;A little help with bathing/dressing/bathroom   Can travel by private vehicle        Equipment Recommendations None recommended by PT  Recommendations for Other Services       Functional Status Assessment Patient has had a recent decline in their functional status and demonstrates the ability to make significant improvements in function in a reasonable and predictable amount of time.     Precautions / Restrictions Precautions Precautions: Fall;ICD/Pacemaker Recall of Precautions/Restrictions: Impaired Restrictions Weight Bearing Restrictions Per Provider Order: No      Mobility  Bed Mobility Overal bed mobility: Needs Assistance             General bed mobility comments: up in recliner    Transfers Overall transfer level: Needs assistance Equipment used: None Transfers: Sit to/from Stand Sit to Stand: Contact guard assist           General transfer comment: slow  to rise, no physical assist    Ambulation/Gait Ambulation/Gait assistance: Contact guard assist, Supervision Gait Distance (Feet): 220 Feet Assistive device: None Gait Pattern/deviations: Step-through pattern, Decreased stride length, Drifts right/left Gait velocity: decr     General Gait Details: decreased attention to L side environment, encouraged scanning L side environment. Min L drift  Stairs            Wheelchair Mobility     Tilt Bed    Modified Rankin (Stroke Patients Only) Modified Rankin (Stroke Patients Only) Pre-Morbid Rankin Score: No symptoms Modified Rankin: Moderately severe disability     Balance Overall balance assessment: Needs assistance Sitting-balance support: Feet supported Sitting balance-Leahy Scale: Good     Standing balance support: Reliant on assistive device for balance Standing balance-Leahy Scale: Poor                               Pertinent Vitals/Pain Pain Assessment Pain Assessment: No/denies pain Pain Intervention(s): Monitored during session    Home Living Family/patient expects to be discharged to:: Private residence Living Arrangements: Spouse/significant other;Other relatives Available Help at Discharge: Family;Available 24 hours/day Type of Home: House Home Access: Stairs to enter   [employer] of Steps: 2   Home Layout: One level Home Equipment: Agricultural consultant (2 wheels)      Prior Function Prior Level of Function : Independent/Modified Independent;Driving             Mobility Comments: Uses RW when he has bouts of vertigo ADLs Comments: Typically Ind  with ADL, iADL and continues to drive. Nathan [name] is target shooting     Extremity/Trunk Assessment   Upper Extremity Assessment Upper Extremity Assessment: Defer to OT evaluation    Lower Extremity Assessment Lower Extremity Assessment: Overall WFL for tasks assessed (sensation and strength symmetric and intact)    Cervical /  Trunk Assessment Cervical / Trunk Assessment: Normal  Communication   Communication Communication: Impaired Factors Affecting Communication: Difficulty expressing self    Cognition Arousal: Alert Behavior During Therapy: WFL for tasks assessed/performed   PT - Cognitive impairments: No apparent impairments                         Following commands: Impaired Following commands impaired: Follows one step commands inconsistently     Cueing Cueing Techniques: Verbal cues, Gestural cues, Tactile cues     General Comments General comments (skin integrity, edema, etc.): vss. OT observed L visual field cut, functionally during session presenting with L inattention to L environment    Exercises     Assessment/Plan    PT Assessment Patient needs continued PT services  PT Problem List Decreased mobility;Decreased activity tolerance;Decreased balance;Decreased knowledge of use of DME;Decreased safety awareness;Decreased knowledge of precautions       PT Treatment Interventions DME instruction;Therapeutic activities;Gait training;Therapeutic exercise;Patient/family education;Balance training;Stair training;Functional mobility training;Neuromuscular re-education    PT Goals (Current goals can be found in the Care Plan section)  Acute Rehab PT Goals Patient Stated Goal: go home when medically ready PT Goal Formulation: With patient/family Time For Goal Achievement: 03/05/24 Potential to Achieve Goals: Good    Frequency Min 2X/week     Co-evaluation               AM-PAC PT 6 Clicks Mobility  Outcome Measure Help needed turning from your back to your side while in a flat bed without using bedrails?: A Little Help needed moving from lying on your back to sitting on the side of a flat bed without using bedrails?: A Little Help needed moving to and from a bed to a chair (including a wheelchair)?: A Little Help needed standing up from a chair using your arms (e.g.,  wheelchair or bedside chair)?: A Little Help needed to walk in hospital room?: A Little Help needed climbing 3-5 steps with a railing? : A Little 6 Click Score: 18    End of Session Equipment Utilized During Treatment: Gait belt Activity Tolerance: Patient tolerated treatment well Patient left: in chair;with call bell/phone within reach;with chair alarm set;with family/visitor present Nurse Communication: Mobility status PT Visit Diagnosis: Unsteadiness on feet (R26.81);Other abnormalities of gait and mobility (R26.89)    Time: 8867-8848 PT Time Calculation (min) (ACUTE ONLY): 19 min   Charges:   PT Evaluation $PT Eval Low Complexity: 1 Low   PT General Charges $$ ACUTE PT VISIT: 1 Visit         Johana RAMAN, PT DPT Acute Rehabilitation Services Secure Chat Preferred  Office [phone]   Doniesha Landau E Stroup 02/20/2024, 1:32 PM

## 2024-02-20 NOTE — Evaluation (Signed)
 Speech Language Pathology Evaluation Patient Details Name: Nathan [name] MRN: [id number] DOB: 1941/01/17 Today's Date: 02/20/2024 Time: 8443-8393 SLP Time Calculation (min) (ACUTE ONLY): 10 min  Problem List:  Patient Active Problem List   Diagnosis Date Noted   Acute ischemic stroke (HCC) 02/19/2024   Statin intolerance 11/08/2023   HTN (hypertension) 09/05/2023   Pressure sore of left ischial area, stage I 03/28/2023   NSVT (nonsustained ventricular tachycardia) (HCC) 02/15/2023   Diabetes mellitus without complication (HCC) 11/08/2022   Mixed hyperlipidemia 11/08/2022   BPPV (benign paroxysmal positional vertigo), right 11/08/2022   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   Pacemaker-Medtronic 05/10/2011   OVERWEIGHT 03/30/2009   Atrioventricular block, 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   Past Medical History:  Past Medical History:  Diagnosis Date   Atrioventricular block, 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   Qualifier: Diagnosis of  By: Lavona, MD, CODY Lynwood     DM (diabetes mellitus) (HCC)    HTN (hypertension)    Hyperlipidemia    Nephrolithiasis    Pacemaker-Medtronic 05/10/2011   Medtronic device implanted January 2010    Vertigo    Past Surgical History:  Past Surgical History:  Procedure Laterality Date   APPENDECTOMY     PACEMAKER PLACEMENT     PPM GENERATOR CHANGEOUT N/A 11/05/2021   Procedure: PPM GENERATOR CHANGEOUT;  Surgeon: Fernande Elspeth BROCKS, MD;  Location: [facility] INVASIVE CV LAB;  Service: Cardiovascular;  Laterality: N/A;   HPI:  Nathan [name] is an 83 yo male presenting to ED 7/21 with sudden onset of word finding difficulty and confusion. He was hypertensive upon arrival and received TNK. CTH showed no LVO and diminutive appearance of multiple M2 branches of the L MCA without occlusion. PMH includes complete AV block s/p pacemaker, DM, HTN, HLD   Assessment / Plan / Recommendation Clinical Impression  Pt presents with expressive language deficits  primarily noted during a repetition task. Pt and his family report a significant improvement compared to time of admission and even earlier this date. Now, errors are most common at the conversation level. Auditory comprehension and confrontation naming appear WFL. Repetition at the phrase and sentence level resulted in phonemic paraphasias, although question the effect of pt's hearing as his hearing aids were unavailable. Recommend ongoing SLP f/u acutely and on an OP basis to target expressive language deficits and assess cognition further.    SLP Assessment  SLP Recommendation/Assessment: Patient needs continued Speech Language Pathology Services SLP Visit Diagnosis: Aphasia (R47.01)     Assistance Recommended at Discharge  PRN  Functional Status Assessment Patient has had a recent decline in their functional status and demonstrates the ability to make significant improvements in function in a reasonable and predictable amount of time.  Frequency and Duration min 2x/week  2 weeks      SLP Evaluation Cognition  Overall Cognitive Status: Difficult to assess Arousal/Alertness: Awake/alert Orientation Level: Oriented X4 Attention: Sustained Sustained Attention: Appears intact Awareness: Impaired Awareness Impairment: Emergent impairment Problem Solving: Impaired Problem Solving Impairment: Functional basic       Comprehension  Auditory Comprehension Overall Auditory Comprehension: Appears within functional limits for tasks assessed    Expression Expression Primary Mode of Expression: Verbal Verbal Expression Overall Verbal Expression: Impaired Initiation: No impairment Automatic Speech: Name;Social Response Level of Generative/Spontaneous Verbalization: Conversation Repetition: Impaired Level of Impairment: Phrase level;Sentence level Naming: No impairment Pragmatics: No impairment   Oral / Motor  Oral Motor/Sensory Function Overall Oral Motor/Sensory Function: Within  functional limits Motor Speech Overall Motor Speech: Appears within  functional limits for tasks assessed            Damien Blumenthal, M.A., CCC-SLP Speech Language Pathology, Acute Rehabilitation Services  Secure Chat preferred [phone]  02/20/2024, 4:12 PM

## 2024-02-20 NOTE — TOC Initial Note (Signed)
 Transition of Care [facility]) - Initial/Assessment Note    Patient Details  Name: Nathan [name] MRN: [id number] Date of Birth: 1941/02/13  Transition of Care [facility]) CM/SW Contact:    Marval Gell, RN Phone Number: 02/20/2024, 3:06 PM  Clinical Narrative:                  Beatris w wife about DC plan.  She states that they have RW and BSC at home. No Additional DME needs identified at this time.  Discussed recs for OP therapies. Referral made to [facility] Neuro for PT OT. Wife instructed to call for faster scheduling.  Added to AVS.   Expected Discharge Plan: Home/Self Care Barriers to Discharge: Continued Medical Work up   Patient Goals and CMS Choice Patient states their goals for this hospitalization and ongoing recovery are:: return home CMS Medicare.gov Compare Post Acute Care list provided to:: Patient Represenative (must comment) Choice offered to / list presented to : Spouse      Expected Discharge Plan and Services In-house Referral: Clinical Social Work Discharge Planning Services: CM Consult Post Acute Care Choice: NA Living arrangements for the past 2 months: Single Family Home                 DME Arranged: N/A         HH Arranged: NA          Prior Living Arrangements/Services Living arrangements for the past 2 months: Single Family Home Lives with:: Spouse              Current home services: DME (has RW and BSC at home)    Activities of Daily Living      Permission Sought/Granted                  Emotional Assessment              Admission diagnosis:  Acute ischemic stroke [facility]) [I63.9] Patient Active Problem List   Diagnosis Date Noted   Acute ischemic stroke (HCC) 02/19/2024   Statin intolerance 11/08/2023   HTN (hypertension) 09/05/2023   Pressure sore of left ischial area, stage I 03/28/2023   NSVT (nonsustained ventricular tachycardia) (HCC) 02/15/2023   Diabetes mellitus without complication (HCC) 11/08/2022   Mixed  hyperlipidemia 11/08/2022   BPPV (benign paroxysmal positional vertigo), right 11/08/2022   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   Pacemaker-Medtronic 05/10/2011   OVERWEIGHT 03/30/2009   Atrioventricular block, 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   PCP:  Ozell Heron HERO, MD Pharmacy:   [facility] [id number] - [address], [place] - [address] [place] [address] Phone: [phone] Fax: [phone]     Social Drivers of Health (SDOH) Social History: SDOH Screenings   Food Insecurity: No Food Insecurity (01/01/2024)  Housing: Unknown (01/01/2024)  Transportation Needs: No Transportation Needs (01/01/2024)  Utilities: Not At Risk (01/01/2024)  Alcohol Screen: Low Risk  (01/01/2024)  Depression (PHQ2-9): Low Risk  (01/01/2024)  Financial Resource Strain: Low Risk  (01/01/2024)  Physical Activity: Inactive (01/01/2024)  Social Connections: Moderately Isolated (01/01/2024)  Stress: No Stress Concern Present (01/01/2024)  Tobacco Use: Low Risk  (01/01/2024)  Health Literacy: Adequate Health Literacy (01/01/2024)   SDOH Interventions:     Readmission Risk Interventions     No data to display

## 2024-02-20 NOTE — Progress Notes (Addendum)
 STROKE TEAM PROGRESS NOTE    SIGNIFICANT HOSPITAL EVENTS 7/21: Patient admitted with aphasia and given TNK to treat possible stroke  INTERIM HISTORY/SUBJECTIVE Patient has remained hemodynamically stable and afebrile overnight.  His speech deficit has improved.  He had a Foley catheter inserted overnight due to urinary retention, and Flomax  was started.  OBJECTIVE  CBC    Component Value Date/Time   WBC 15.0 (H) 02/19/2024 2059   RBC 4.93 02/19/2024 2059   HGB 15.2 02/19/2024 2059   HGB 14.5 10/29/2021 1603   HCT 43.5 02/19/2024 2059   HCT 41.3 10/29/2021 1603   PLT 235 02/19/2024 2059   PLT 268 10/29/2021 1603   MCV 88.2 02/19/2024 2059   MCV 91 10/29/2021 1603   MCH 30.8 02/19/2024 2059   MCHC 34.9 02/19/2024 2059   RDW 12.5 02/19/2024 2059   RDW 11.9 10/29/2021 1603   LYMPHSABS 1.4 02/19/2024 2059   MONOABS 1.3 (H) 02/19/2024 2059   EOSABS 0.1 02/19/2024 2059   BASOSABS 0.1 02/19/2024 2059    BMET    Component Value Date/Time   NA 129 (L) 02/19/2024 2059   NA 138 10/29/2021 1603   K 4.8 02/19/2024 2059   CL 97 (L) 02/19/2024 2059   CO2 20 (L) 02/19/2024 2059   GLUCOSE 157 (H) 02/19/2024 2059   BUN 9 02/19/2024 2059   BUN 13 10/29/2021 1603   CREATININE 0.70 02/19/2024 2059   CALCIUM  9.1 02/19/2024 2059   EGFR 86 10/29/2021 1603   GFRNONAA >60 02/19/2024 2059    IMAGING past 24 hours ECHOCARDIOGRAM COMPLETE Result Date: 02/20/2024    ECHOCARDIOGRAM REPORT   Patient Name:   Nathan [name] Date of Exam: 02/20/2024 Medical Rec #:  [id number]       Height:       72.0 in Accession #:    [id number]      Weight:       175.3 lb Date of Birth:  [birth year]       BSA:          2.014 m Patient Age:    82 years        BP:           110/66 mmHg Patient Gender: M               HR:           66 bpm. Exam Location:  Inpatient Procedure: 2D Echo, Color Doppler, Cardiac Doppler and Intracardiac            Opacification Agent (Both Spectral and Color Flow Doppler were             utilized during procedure). Indications:    Stroke i63.9  History:        Patient has prior history of Echocardiogram examinations, most                 recent 03/24/2023. Pacemaker; Risk Factors:Hypertension, Diabetes                 and Dyslipidemia.  Sonographer:    Damien Senior RDCS Referring Phys: [id number] ASHISH ARORA  Sonographer Comments: Technically difficult study limited by patient cooperation. IMPRESSIONS  1. Left ventricular ejection fraction, by estimation, is 60 to 65%. The left ventricle has normal function. The left ventricle has no regional wall motion abnormalities. There is moderate asymmetric left ventricular hypertrophy of the septal segment. Left ventricular diastolic parameters are indeterminate.  2. Right ventricular systolic function is normal. The right  ventricular size is normal. There is normal pulmonary artery systolic pressure. The estimated right ventricular systolic pressure is 29.6 mmHg.  3. Left atrial size was mildly dilated.  4. The mitral valve is degenerative. Trivial mitral valve regurgitation. No evidence of mitral stenosis. The mean mitral valve gradient is 2.0 mmHg with average heart rate of 70 bpm. Severe mitral annular calcification.  5. Unable to get gradients on today's study due to limited patient cooperation; Visually on short axis assessment, valve consistent with moderate aortic stenosis (mean gradient 19 mm Hg last year). The aortic valve is calcified. There is moderate calcification of the aortic valve. Aortic valve regurgitation is not visualized.  6. The inferior vena cava is normal in size with greater than 50% respiratory variability, suggesting right atrial pressure of 3 mmHg. Comparison(s): Prior images reviewed side by side. Similar to prior with caveats on aortic valve assessment as noted above. FINDINGS  Left Ventricle: Left ventricular ejection fraction, by estimation, is 60 to 65%. The left ventricle has normal function. The left ventricle has no regional  wall motion abnormalities. Definity  contrast agent was given IV to delineate the left ventricular  endocardial borders. The left ventricular internal cavity size was normal in size. There is moderate asymmetric left ventricular hypertrophy of the septal segment. Left ventricular diastolic parameters are indeterminate. Right Ventricle: The right ventricular size is normal. No increase in right ventricular wall thickness. Right ventricular systolic function is normal. There is normal pulmonary artery systolic pressure. The tricuspid regurgitant velocity is 2.58 m/s, and  with an assumed right atrial pressure of 3 mmHg, the estimated right ventricular systolic pressure is 29.6 mmHg. Left Atrium: Left atrial size was mildly dilated. Right Atrium: Right atrial size was not well visualized. Pericardium: There is no evidence of pericardial effusion. Presence of epicardial fat layer. Mitral Valve: The mitral valve is degenerative in appearance. Severe mitral annular calcification. Trivial mitral valve regurgitation. No evidence of mitral valve stenosis. MV peak gradient, 6.1 mmHg. The mean mitral valve gradient is 2.0 mmHg with average heart rate of 70 bpm. Tricuspid Valve: The tricuspid valve is normal in structure. Tricuspid valve regurgitation is mild. Aortic Valve: Unable to get gradients on today's study due to limited patient cooperation; Visually on short axis assessment, valve consistent with moderate aortic stenosis (mean gradient 19 mm Hg last year). The aortic valve is calcified. There is moderate calcification of the aortic valve. Aortic valve regurgitation is not visualized. Pulmonic Valve: The pulmonic valve was normal in structure. Pulmonic valve regurgitation is trivial. Aorta: The aortic root is normal in size and structure and the ascending aorta was not well visualized. Venous: The inferior vena cava is normal in size with greater than 50% respiratory variability, suggesting right atrial pressure of 3  mmHg. IAS/Shunts: No atrial level shunt detected by color flow Doppler. Additional Comments: A device lead is visualized in the right ventricle and right atrium.  LEFT VENTRICLE PLAX 2D LVIDd:         3.70 cm   Diastology LVIDs:         2.70 cm   LV e' medial:    6.06 cm/s LV PW:         0.90 cm   LV E/e' medial:  12.7 LV IVS:        1.40 cm   LV e' lateral:   5.28 cm/s LVOT diam:     2.00 cm   LV E/e' lateral: 14.6 LV SV:  77 LV SV Index:   38 LVOT Area:     3.14 cm  RIGHT VENTRICLE RV S prime:     14.00 cm/s TAPSE (M-mode): 2.0 cm LEFT ATRIUM             Index LA diam:        3.40 cm 1.69 cm/m LA Vol (A2C):   71.2 ml 35.35 ml/m LA Vol (A4C):   75.5 ml 37.48 ml/m LA Biplane Vol: 74.3 ml 36.89 ml/m  AORTIC VALVE LVOT Vmax:   109.00 cm/s LVOT Vmean:  89.900 cm/s LVOT VTI:    0.245 m  AORTA Ao Root diam: 3.30 cm MITRAL VALVE                TRICUSPID VALVE MV Area (PHT): 1.81 cm     TR Peak grad:   26.6 mmHg MV Area VTI:   2.05 cm     TR Vmax:        258.00 cm/s MV Peak grad:  6.1 mmHg MV Mean grad:  2.0 mmHg     SHUNTS MV Vmax:       1.23 m/s     Systemic VTI:  0.24 m MV Vmean:      65.2 cm/s    Systemic Diam: 2.00 cm MV Decel Time: 419 msec MV E velocity: 77.10 cm/s MV A velocity: 129.00 cm/s MV E/A ratio:  0.60 Stanly Leavens MD Electronically signed by Stanly Leavens MD Signature Date/Time: 02/20/2024/9:57:11 AM    Final    DG Chest 1 View Result Date: 02/20/2024 CLINICAL DATA:  [id number] Aspiration into airway [id number] EXAM: CHEST  1 VIEW COMPARISON:  CT chest 08/26/2008, CT angio chest 02/19/2024 FINDINGS: Left chest wall dual lead pacemaker. The heart and mediastinal contours are unchanged. Atherosclerotic plaque. No focal consolidation. Question nodular interstitial markings within the right lower lobe and left upper lobe. No pleural effusion. No pneumothorax. No acute osseous abnormality. IMPRESSION: 1. Question nodular interstitial markings within the right lower lobe and left upper  lobe. Finding may be infectious versus inflammatory. Followup PA and lateral chest X-ray is recommended in 3-4 weeks following therapy to ensure resolution. 2.  Aortic Atherosclerosis (ICD10-I70.0). Electronically Signed   By: Morgane  Naveau M.D.   On: 02/20/2024 00:20   CT ANGIO HEAD NECK W WO CM (CODE STROKE) Result Date: 02/19/2024 CLINICAL DATA:  Neuro deficit, concern for stroke, aphasia, vision loss. EXAM: CT ANGIOGRAPHY HEAD AND NECK WITH AND WITHOUT CONTRAST TECHNIQUE: Multidetector CT imaging of the head and neck was performed using the standard protocol during bolus administration of intravenous contrast. Multiplanar CT image reconstructions and MIPs were obtained to evaluate the vascular anatomy. Carotid stenosis measurements (when applicable) are obtained utilizing NASCET criteria, using the distal internal carotid diameter as the denominator. RADIATION DOSE REDUCTION: This exam was performed according to the departmental dose-optimization program which includes automated exposure control, adjustment of the mA and/or kV according to patient size and/or use of iterative reconstruction technique. CONTRAST:  OMNIPAQUE  IOHEXOL  350 MG/ML SOLN COMPARISON:  Same-day head CT. FINDINGS: CTA NECK FINDINGS Aortic arch: Standard configuration of the aortic arch. Imaged portion shows no evidence of aneurysm or dissection. Atherosclerosis of the partially visualized aortic arch. No significant stenosis of the major arch vessel origins. Pulmonary arteries: Not well visualized. Subclavian arteries: Patent bilaterally. Circumferential calcified atherosclerosis at the origin of the left subclavian artery without significant stenosis. Right carotid system: Patent from the origin to the skull base. Mild atherosclerosis of the common carotid  artery. Calcified atherosclerosis at the carotid bifurcation without hemodynamically significant stenosis. No evidence of dissection. Left carotid system: Patent from the  origin to the skull base. Mild atherosclerosis along the distal common carotid artery. There is bulky calcified atherosclerosis at the carotid bifurcation resulting in approximately 40% stenosis at the origin of the cervical ICA. No evidence of dissection. Vertebral arteries: Codominant. No evidence of dissection, stenosis (50% or greater), or occlusion. Skeleton: No acute findings. Degenerative changes in the cervical spine. Other neck: The visualized airway is patent. No cervical lymphadenopathy. Upper chest: The visualized lung apices are unremarkable. Partially visualized left chest wall pacer device. Review of the MIP images confirms the above findings CTA HEAD FINDINGS ANTERIOR CIRCULATION: The intracranial ICAs are patent bilaterally. Atherosclerosis of the carotid siphons. Mild stenosis of the bilateral cavernous ICAs and left paraclinoid ICA. No significant stenosis, proximal occlusion, aneurysm, or vascular malformation. MCAs: Patent bilaterally. Diminutive appearance of M2 superior division branches of the left MCA without occlusion. Additional somewhat diminutive M2 inferior division branches of the left MCA. ACAs: The anterior cerebral arteries are patent bilaterally. POSTERIOR CIRCULATION: No significant stenosis, proximal occlusion, aneurysm, or vascular malformation. PCAs: The posterior cerebral arteries are patent bilaterally. Pcomm: Not well visualized. SCAs: The superior cerebellar arteries are patent bilaterally. Basilar artery: Patent AICAs: Patent PICAs: Patent Vertebral arteries: The intracranial vertebral arteries are patent. Venous sinuses: As permitted by contrast timing, patent. Anatomic variants: None Review of the MIP images confirms the above findings IMPRESSION: No large vessel occlusion. Diminutive appearance of multiple M2 branches of the left MCA without occlusion. Atherosclerosis of the carotid siphons resulting in mild stenosis. Atherosclerosis at the left carotid bifurcation  resulting in approximately 40% stenosis. Aortic Atherosclerosis (ICD10-I70.0). These results were called by telephone at the time of interpretation on 02/19/2024 at 8:48 pm to provider [facility] , who verbally acknowledged these results. Electronically Signed   By: Donnice Mania M.D.   On: 02/19/2024 21:04   CT HEAD CODE STROKE WO CONTRAST Result Date: 02/19/2024 CLINICAL DATA:  Code stroke. Neuro deficit, concern for stroke, aphasia, vision problems. EXAM: CT HEAD WITHOUT CONTRAST TECHNIQUE: Contiguous axial images were obtained from the base of the skull through the vertex without intravenous contrast. RADIATION DOSE REDUCTION: This exam was performed according to the departmental dose-optimization program which includes automated exposure control, adjustment of the mA and/or kV according to patient size and/or use of iterative reconstruction technique. COMPARISON:  CT head 08/03/2021. FINDINGS: Brain: No acute intracranial hemorrhage. No CT evidence of acute infarct. Nonspecific hypoattenuation in the periventricular and subcortical white matter favored to reflect chronic microvascular ischemic changes. No edema, mass effect, or midline shift. The basilar cisterns are patent. Ventricles: The ventricles are normal. Vascular: Atherosclerotic calcifications of the carotid siphons. No hyperdense vessel. Skull: No acute or aggressive finding. Orbits: Orbits are symmetric. Sinuses: The visualized paranasal sinuses are clear. Other: Mastoid air cells are clear. ASPECTS [facility] Stroke Program Early CT Score) - Ganglionic level infarction (caudate, lentiform nuclei, internal capsule, insula, M1-M3 cortex): 7 - Supraganglionic infarction (M4-M6 cortex): 3 Total score (0-10 with 10 being normal): 10 IMPRESSION: 1. No CT evidence of acute intracranial abnormality. 2. Chronic microvascular ischemic changes. 3. ASPECTS is 10 These results were communicated to Dr. Arora at 8:47 pm on 02/19/2024 by text page via the [facility]  messaging system. Electronically Signed   By: Donnice Mania M.D.   On: 02/19/2024 20:47    Vitals:   02/20/24 1000 02/20/24 1056 02/20/24 1100 02/20/24 1200  BP: 131/70  (!) 110/58   Pulse: 64 (!) 59 (!) 59 64  Resp: 17 19 20  (!) 21  Temp:      TempSrc:      SpO2: 100% 98% 98% 100%  Weight:      Height:         PHYSICAL EXAM General:  Alert, well-nourished, well-developed patient in no acute distress Psych:  Mood and affect appropriate for situation CV: Regular rate and rhythm on monitor Respiratory:  Regular, unlabored respirations on room air   NEURO:  Mental Status: Patient is alert and oriented to person and place, gives the correct month but the wrong year into states his age incorrectly.  He is oriented to the situation and is able to state events of yesterday Speech/Language: speech is without dysarthria but with some word finding difficulties, improved from prior.  He is able to name some but not all objects and repetition of complex phrases is impaired.  Cranial Nerves:  II: PERRL.  III, IV, VI: EOMI. Eyelids elevate symmetrically.  V: Sensation is intact to light touch and symmetrical to face.  VII: Face is symmetrical resting and smiling VIII: hearing intact to voice. IX, X: Phonation is normal.  XII: tongue is midline without fasciculations. Motor: 5/5 strength to all muscle groups tested.  Tone: is normal and bulk is normal Sensation- Intact to light touch bilaterally.  Coordination: FTN intact bilaterally Gait- deferred  Most Recent NIH  1a Level of Conscious.: 0 1b LOC Questions: 1 1c LOC Commands: 0 2 Best Gaze: 0 3 Visual: 0 4 Facial Palsy: 0 5a Motor Arm - left: 0 5b Motor Arm - Right: 0 6a Motor Leg - Left: 0 6b Motor Leg - Right: 0 7 Limb Ataxia: 0 8 Sensory: 0 9 Best Language: 1 10 Dysarthria: 0 11 Extinct. and Inatten.: 0 TOTAL: 2   ASSESSMENT/PLAN  Nathan [name] is a 83 y.o. male with history of complete AV block status post  pacemaker, diabetes, hypertension and hyperlipidemia admitted for acute onset aphasia.  He was given TNK to treat stroke, and speech deficits have improved.  Awaiting MRI later today.  Will have pacemaker interrogated today.  NIH on Admission 5  Stroke: Left brain infarct s/p TNK, etiology: unclear at this time Code Stroke CT head No acute abnormality. Small vessel disease.  ASPECTS 10.    CTA head & neck no for LVO, diminutive appearance of multiple left MCA M2 branches without occlusion, atherosclerosis of bilateral carotid siphons, atherosclerosis at left carotid bifurcation resulting in 40% stenosis MRI pending Pacemaker interrogation pending 2D Echo EF 60 to 65%, no PFO LDL 118 HgbA1c pending UDS neg VTE prophylaxis -SCDs aspirin  81 mg daily prior to admission, now on No antithrombotic as he is less than 24 hours from TNK administration Therapy recommendations:  outpt PT Disposition: Pending  Left ICA athero CTA head and neck atherosclerosis at left carotid bifurcation resulting in 40% stenosis Seems to be high risk plaque based on morphology May consider VVS consult if needed  Hypertension Home meds: Lisinopril  5 mg daily, carvedilol  12.5 mg twice daily Stable Blood Pressure Goal: BP less than 180/105  Long term BP goal normotensive  Hyperlipidemia Home meds: Ezetimibe  10 mg daily  LDL 118, goal < 70 Add atorvastatin  40 mg daily Continue home zetia  Continue statin and zetia  at discharge  Diabetes type II poorly controlled Home meds: Metformin  1000 mg twice daily, nateglinide  120 mg 3 times daily with meals HgbA1c pending, goal <  7.0 CBGs SSI Recommend close follow-up with PCP for better DM control  Other Stroke Risk Factors Advanced age  Other Active Problems AVB s/p pacer Hyponatremia Na 131-> pending Leukocytosis WBC 15.0, UA neg Urinary retention - unsuccessful foley - now on coud catheter, put on flomax   Hospital day # 1  Patient seen by NP with MD, MD  to addend note as needed. Cortney E Everitt Clint Kill , MSN, AGACNP-BC [facility] See Amion for schedule and pager information 02/20/2024 12:33 PM  ATTENDING NOTE: I reviewed above note and agree with the assessment and plan. Pt was seen and examined.   RN is at the bedside. Pt is awake, alert, lying in bed, eyes open, orientated to place and month but not to age or year. He still has moderate expressive aphasia with wording finding difficulty but able to speak some short sentences from time to time. Follows all simple commands, able to name 2/3 and repeat simple sentences with paraphasic errors. No gaze palsy, tracking bilaterally, visual field full, PERRL. Mild R facial droop. Tongue midline. Bilateral UEs 5/5, no drift. Bilaterally LEs 5/5, no drift. Sensation symmetrical bilaterally, b/l FTN intact, gait not tested.   Pt likely to have left MCA stroke, could be from left ICA significant athero although no significant stenosis but morphology concerning for high risk plaque. Will consult VVS if needed after MRI. On home zetia , will add lipitor. Pending MRI at 24 h, will consider DAPT after 24h if no bleeding.   For detailed assessment and plan, please refer to above as I have made changes wherever appropriate.   Ary Cummins, MD PhD Stroke Neurology 02/20/2024 4:05 PM  This patient is critically ill due to stroke s/p TNK, left ICA significant athero, AVB on pacer and at significant risk of neurological worsening, death form recurrent stroke, bleeding from TNK, heart failure. This patient's care requires constant monitoring of vital signs, hemodynamics, respiratory and cardiac monitoring, review of multiple databases, neurological assessment, discussion with family, other specialists and medical decision making of high complexity. I spent 40 minutes of neurocritical care time in the care of this patient.     To contact Stroke Continuity provider, please refer to [email or web address]. After hours,  contact General Neurology

## 2024-02-20 NOTE — TOC CAGE-AID Note (Signed)
 Transition of Care [facility]) - CAGE-AID Screening   Patient Details  Name: Nathan [name] MRN: [id number] Date of Birth: 01/03/1941  Transition [facility]) CM/SW Contact:    Demario Faniel E Purl Claytor, LCSW Phone Number: 02/20/2024, 9:05 AM   Clinical Narrative:    CAGE-AID Screening:    Have You Ever Felt You Ought to Cut Down on Your Drinking or Drug Use?: No Have People Annoyed You By [employer] Your Drinking Or Drug Use?: No Have You Felt Bad Or Guilty About Your Drinking Or Drug Use?: No Have You Ever Had a Drink or Used Drugs First Thing In The Morning to Steady Your Nerves or to Get Rid of a Hangover?: No CAGE-AID Score: 0  Substance Abuse Education Offered: No

## 2024-02-20 NOTE — Evaluation (Addendum)
 Occupational Therapy Evaluation Patient Details Name: Nathan [name] MRN: [id number] DOB: [birth year] Today's Date: 02/20/2024   History of Present Illness   83 yo male presents to [facility] ED on 02/19/2024 with sudden onset of difficulty with words and confusion. TNK administered 7/21 at 2000. CT showed no large vessel occlusion and diminutive appearance of multiple M2 branches of the left MCA w/o occlusion. PMH: complete AV block s/p pacemaker, DM, HTN, hyperlipidemia     Clinical Impressions Patient admitted for the diagnosis above.  PTA he lives at home with his spouse, drive and needed no assist for ADL and iADL.  Patient did use a RW at times if he was having a bout of vertigo, but typically did not need a RW.  Presents with expressive and ? Receptive aphasia, significant L visual field cut, poor dynamic balance, poor safety, decreased command following, and difficulty with problem solving, all impacting independence.  Patient is needing Min physical A for ADL completion, but due to constant cues, safety and sequencing, he is closer to Mod A for ADL and Min A with simple transfers in the room with RW.  OT will follow in the acute setting and Patient will benefit from outpatient neuro OT for vision and cognitive remediation.       If plan is discharge home, recommend the following:   A little help with bathing/dressing/bathroom;A little help with walking and/or transfers;Assistance with cooking/housework;Direct supervision/assist for medications management;Direct supervision/assist for financial management;Supervision due to cognitive status     Functional Status Assessment   Patient has had a recent decline in their functional status and demonstrates the ability to make significant improvements in function in a reasonable and predictable amount of time.     Equipment Recommendations   Tub/shower seat     Recommendations for Other Services   Other (comment) (vision and cognitive  rehab)     Precautions/Restrictions   Precautions Precautions: Fall Recall of Precautions/Restrictions: Impaired Precaution/Restrictions Comments: Pacemaker Restrictions Weight Bearing Restrictions Per Provider Order: No     Mobility Bed Mobility Overal bed mobility: Needs Assistance Bed Mobility: Supine to Sit     Supine to sit: Supervision          Transfers Overall transfer level: Needs assistance   Transfers: Sit to/from Stand, Bed to chair/wheelchair/BSC Sit to Stand: Contact guard assist     Step pivot transfers: Min assist     General transfer comment: Running into objects on the L visual field, cues for safety and sequencing.  Expressive aphaisa and ? some recetive as well.      Balance Overall balance assessment: Needs assistance Sitting-balance support: Feet supported Sitting balance-Leahy Scale: Good     Standing balance support: Reliant on assistive device for balance Standing balance-Leahy Scale: Poor                             ADL either performed or assessed with clinical judgement   ADL Overall ADL's : Needs assistance/impaired Eating/Feeding: Set up;Sitting   Grooming: Wash/dry hands;Contact guard assist;Standing   Upper Body Bathing: Contact guard assist;Cueing for safety;Sitting   Lower Body Bathing: Minimal assistance;Sit to/from stand;Cueing for safety   Upper Body Dressing : Contact guard assist;Sitting;Cueing for safety;Cueing for sequencing;Minimal assistance   Lower Body Dressing: Minimal assistance;Cueing for safety;Sit to/from stand   Toilet Transfer: Minimal assistance;Cueing for sequencing;Cueing for safety   Toileting- Clothing Manipulation and Hygiene: Supervision/safety;Cueing for sequencing;Cueing for safety  Vision Ability to See in Adequate Light: 2 Moderately impaired Patient Visual Report: Peripheral vision impairment Vision Assessment?: Yes Eye Alignment: Within Functional  Limits Additional Comments: L visual field cut     Perception Perception: Impaired Preception Impairment Details: Inattention/Neglect     Praxis Praxis: Impaired Praxis Impairment Details: Organization     Pertinent Vitals/Pain Pain Assessment Pain Assessment: Faces Faces Pain Scale: Hurts a little bit Pain Location: chest Pain Descriptors / Indicators: Pressure Pain Intervention(s): Monitored during session     Extremity/Trunk Assessment Upper Extremity Assessment Upper Extremity Assessment: Overall WFL for tasks assessed   Lower Extremity Assessment Lower Extremity Assessment: Defer to PT evaluation   Cervical / Trunk Assessment Cervical / Trunk Assessment: Normal   Communication Communication Communication: Impaired Factors Affecting Communication: Difficulty expressing self   Cognition Arousal: Alert Behavior During Therapy: WFL for tasks assessed/performed Cognition: Cognition impaired       Memory impairment (select all impairments): Working [employer] fast streamer, Short-term memory Attention impairment (select first level of impairment): Focused attention Executive functioning impairment (select all impairments): Sequencing, Reasoning, Problem solving                   Following commands: Impaired Following commands impaired: Follows one step commands inconsistently     Cueing  General Comments   Cueing Techniques: Verbal cues;Gestural cues;Tactile cues      Exercises     Shoulder Instructions      Home Living Family/patient expects to be discharged to:: Private residence Living Arrangements: Spouse/significant other;Other relatives Available Help at Discharge: Family;Available 24 hours/day Type of Home: House Home Access: Stairs to enter [employer] of Steps: 2   Home Layout: One level     Bathroom Shower/Tub: Producer, television/film/video: Standard Bathroom Accessibility: Yes How Accessible: Accessible via walker Home Equipment:  Rolling Walker (2 wheels)          Prior Functioning/Environment Prior Level of Function : Independent/Modified Independent;Driving             Mobility Comments: Uses RW when he has bouts of vertigo ADLs Comments: Typically Ind with ADL, iADL and continues to drive.    OT Problem List: Impaired balance (sitting and/or standing);Impaired vision/perception;Decreased safety awareness;Decreased cognition   OT Treatment/Interventions: Self-care/ADL training;Therapeutic activities;Visual/perceptual remediation/compensation;Patient/family education;DME and/or AE instruction;Balance training      OT Goals(Current goals can be found in the care plan section)   Acute Rehab OT Goals Patient Stated Goal: Return home OT Goal Formulation: With patient Time For Goal Achievement: 03/05/24 Potential to Achieve Goals: Good   OT Frequency:  Min 2X/week    Co-evaluation              AM-PAC OT 6 Clicks Daily Activity     Outcome Measure Help from another person eating meals?: A Little Help from another person taking care of personal grooming?: A Little Help from another person toileting, which includes using toliet, bedpan, or urinal?: A Little Help from another person bathing (including washing, rinsing, drying)?: A Lot Help from another person to put on and taking off regular upper body clothing?: A Little Help from another person to put on and taking off regular lower body clothing?: A Lot 6 Click Score: 16   End of Session Equipment Utilized During Treatment: Rolling walker (2 wheels) Nurse Communication: Mobility status  Activity Tolerance: Patient tolerated treatment well Patient left: in chair;with call bell/phone within reach;with chair alarm set  OT Visit Diagnosis: Unsteadiness on feet (R26.81);Low vision, both eyes (  H54.2);Other symptoms and signs involving cognitive function                Time: 1025-1050 OT Time Calculation (min): 25 min Charges:  OT General  Charges $OT Visit: 1 Visit OT Evaluation $OT Eval Moderate Complexity: 1 Mod OT Treatments $Self Care/Home Management : 8-22 mins  02/20/2024  RP, OTR/L  Acute Rehabilitation Services  Office:  [phone]   Charlie JONETTA Halsted 02/20/2024, 11:46 AM

## 2024-02-20 NOTE — Progress Notes (Signed)
 Echocardiogram 2D Echocardiogram has been performed.  Damien FALCON Altheria Shadoan RDCS 02/20/2024, 9:12 AM

## 2024-02-20 NOTE — Plan of Care (Signed)
 Foley insertion failed x 2 RN requested order for insert coud catheter.  Order for catheter and topical anesthetic in the chart.   RONAL Lav, MD Neurology

## 2024-02-21 ENCOUNTER — Inpatient Hospital Stay (HOSPITAL_COMMUNITY)

## 2024-02-21 DIAGNOSIS — R29702 NIHSS score 2: Secondary | ICD-10-CM | POA: Diagnosis not present

## 2024-02-21 DIAGNOSIS — I6523 Occlusion and stenosis of bilateral carotid arteries: Secondary | ICD-10-CM | POA: Diagnosis not present

## 2024-02-21 DIAGNOSIS — I6350 Cerebral infarction due to unspecified occlusion or stenosis of unspecified cerebral artery: Secondary | ICD-10-CM | POA: Diagnosis not present

## 2024-02-21 DIAGNOSIS — E1151 Type 2 diabetes mellitus with diabetic peripheral angiopathy without gangrene: Secondary | ICD-10-CM | POA: Diagnosis not present

## 2024-02-21 DIAGNOSIS — I639 Cerebral infarction, unspecified: Secondary | ICD-10-CM

## 2024-02-21 DIAGNOSIS — I6522 Occlusion and stenosis of left carotid artery: Secondary | ICD-10-CM

## 2024-02-21 LAB — GLUCOSE, CAPILLARY
Glucose-Capillary: 120 mg/dL — ABNORMAL HIGH (ref 70–99)
Glucose-Capillary: 153 mg/dL — ABNORMAL HIGH (ref 70–99)
Glucose-Capillary: 208 mg/dL — ABNORMAL HIGH (ref 70–99)
Glucose-Capillary: 155 mg/dL — ABNORMAL HIGH (ref 70–99)

## 2024-02-21 LAB — BASIC METABOLIC PANEL WITH GFR
Anion gap: 11 (ref 5–15)
BUN: 12 mg/dL (ref 8–23)
CO2: 21 mmol/L — ABNORMAL LOW (ref 22–32)
Calcium: 8.7 mg/dL — ABNORMAL LOW (ref 8.9–10.3)
Chloride: 100 mmol/L (ref 98–111)
Creatinine, Ser: 0.8 mg/dL (ref 0.61–1.24)
GFR, Estimated: >60 mL/min (ref >60)
Glucose, Bld: 137 mg/dL — ABNORMAL HIGH (ref 70–99)
Potassium: 3.9 mmol/L (ref 3.5–5.1)
Sodium: 132 mmol/L — ABNORMAL LOW (ref 135–145)

## 2024-02-21 LAB — CBC
HCT: 36.8 % — ABNORMAL LOW (ref 39.0–52.0)
Hemoglobin: 13 g/dL (ref 13.0–17.0)
MCH: 31.4 pg (ref 26.0–34.0)
MCHC: 35.3 g/dL (ref 30.0–36.0)
MCV: 88.9 fL (ref 80.0–100.0)
Platelets: 198 K/uL (ref 150–400)
RBC: 4.14 MIL/uL — ABNORMAL LOW (ref 4.22–5.81)
RDW: 12.9 % (ref 11.5–15.5)
WBC: 9.7 K/uL (ref 4.0–10.5)
nRBC: 0 % (ref 0.0–0.2)

## 2024-02-21 LAB — HEMOGLOBIN A1C
Hgb A1c MFr Bld: 6.3 % — ABNORMAL HIGH (ref 4.8–5.6)
Mean Plasma Glucose: 134.11 mg/dL

## 2024-02-21 MED ORDER — ASPIRIN 81 MG PO CHEW
81.0000 mg | CHEWABLE_TABLET | Freq: Every day | ORAL | Status: DC
  Administered 2024-02-21 – 2024-02-22 (×2): 81 mg via ORAL
  Filled 2024-02-21 (×2): qty 1

## 2024-02-21 MED ORDER — ENOXAPARIN SODIUM 40 MG/0.4ML IJ SOSY
40.0000 mg | PREFILLED_SYRINGE | Freq: Every evening | INTRAMUSCULAR | Status: DC
  Administered 2024-02-21: 40 mg via SUBCUTANEOUS
  Filled 2024-02-21: qty 0.4

## 2024-02-21 MED ORDER — CLOPIDOGREL BISULFATE 75 MG PO TABS
75.0000 mg | ORAL_TABLET | Freq: Every day | ORAL | Status: DC
  Administered 2024-02-21 – 2024-02-22 (×2): 75 mg via ORAL
  Filled 2024-02-21 (×2): qty 1

## 2024-02-21 NOTE — Plan of Care (Signed)

## 2024-02-21 NOTE — Progress Notes (Signed)
  Device system confirmed to be MRI conditional, with implant date > 6 weeks ago, and no evidence of abandoned or epicardial leads in review of most recent CXR  Device last cleared by EP Provider: Jodie Passey. PA  Clearance is good through for 1 year as long as parameters remain stable at time of check. If pt undergoes a cardiac device procedure during that time, they should be re-cleared.   Tachy-therapies to be programmed off if applicable with device back to pre-MRI settings after completion of exam.  [employer] - Programming recommendation received through [employer] App/Tablet  Suzan Recardo Arna Debby  02/21/2024 9:02 AM

## 2024-02-21 NOTE — Progress Notes (Signed)
 Nathan [name] was transferred via wheelchair to [phone]. Nurse at bedside upon arrival to the new room, all belongings with patient. Wife was called and updated on room change, all questions answered.

## 2024-02-21 NOTE — Discharge Summary (Shared)
 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, etiology likely large vessel disease from left ICA athero and stenosis   Secondary diagnosis Carotid stenosis, left HTN HLD DM AVB s/p pacer Urinary retention    Allergies as of 02/22/2024       Reactions   Actos [pioglitazone] Other (See Comments)   Achilles pain Ocular symptoms (twice)   Benicar [olmesartan] Swelling   Hypotension    Diovan [valsartan] Other (See Comments)   Dizziness    Flexeril [cyclobenzaprine] Other (See Comments)   Unknown reaction   Niacin And Related Itching, Other (See Comments)   Flushing   Niaspan [niacin] Other (See Comments)   Flushing   Sulfonamide Derivatives Other (See Comments)   Unknown reaction        Medication List     STOP taking these medications    aspirin  81 MG tablet Replaced by: aspirin  81 MG chewable tablet       TAKE these medications    Accu-Chek FastClix Lancets Misc Use as directed four times a day   aspirin  81 MG chewable tablet Chew 1 tablet (81 mg total) by mouth daily for 90 doses. Start taking on: February 23, 2024 Replaces: aspirin  81 MG tablet   atorvastatin  40 MG tablet Commonly known as: LIPITOR Take 1 tablet (40 mg total) by mouth daily. Start taking on: February 23, 2024   carvedilol  12.5 MG tablet Commonly known as: COREG  TAKE 1 TABLET BY MOUTH 2 TIMES A DAY **TAKE MORNING DOSE IF SYSTOLIC BLOOD PRESSURE IS 110 OR HIGHER** What changed: See the new instructions.   clopidogrel  75 MG tablet Commonly known as: PLAVIX  Take 1 tablet (75 mg total) by mouth daily. Start taking on: February 23, 2024   ezetimibe  10 MG  tablet Commonly known as: Zetia  Take 1 tablet (10 mg total) by mouth daily. What changed: when to take this   lisinopril  5 MG tablet Commonly known as: ZESTRIL  Take 1 tablet (5 mg total) by mouth daily. What changed: when to take this   metFORMIN  500 MG tablet Commonly known as: GLUCOPHAGE  Take 2 tablets (1,000 mg total) by mouth 2 (two) times daily with a meal.   nateglinide  120 MG tablet Commonly known as: STARLIX  Take 1 tablet (120 mg total) by mouth 3 (three) times daily with meals.   triamcinolone  cream 0.1 % Commonly known as: KENALOG  Apply 1 Application topically 2 (two) times daily as needed (eczema).        LABORATORY STUDIES CBC    Component Value Date/Time   WBC 7.6 02/22/2024 0333   RBC 4.26 02/22/2024 0333   HGB 13.2 02/22/2024 0333   HGB 14.5 10/29/2021 1603   HCT 38.1 (L) 02/22/2024 0333   HCT 41.3 10/29/2021 1603   PLT 201 02/22/2024 0333   PLT 268 10/29/2021 1603   MCV 89.4 02/22/2024 0333   MCV 91 10/29/2021 1603   MCH 31.0 02/22/2024 0333   MCHC 34.6 02/22/2024 0333   RDW 12.9 02/22/2024 0333   RDW 11.9 10/29/2021 1603   LYMPHSABS 1.4 02/19/2024 2059   MONOABS 1.3 (H) 02/19/2024 2059   EOSABS 0.1 02/19/2024 2059  BASOSABS 0.1 02/19/2024 2059   CMP    Component Value Date/Time   NA 132 (L) 02/22/2024 0333   NA 138 10/29/2021 1603   K 3.7 02/22/2024 0333   CL 98 02/22/2024 0333   CO2 22 02/22/2024 0333   GLUCOSE 139 (H) 02/22/2024 0333   BUN 10 02/22/2024 0333   BUN 13 10/29/2021 1603   CREATININE 0.86 02/22/2024 0333   CALCIUM  8.7 (L) 02/22/2024 0333   PROT 6.7 02/19/2024 2059   ALBUMIN 3.8 02/19/2024 2059   AST 17 02/19/2024 2059   ALT 16 02/19/2024 2059   ALKPHOS 89 02/19/2024 2059   BILITOT 1.1 02/19/2024 2059   GFRNONAA >60 02/22/2024 0333   GFRAA >60 05/18/2017 1521   COAGS Lab Results  Component Value Date   INR 1.1 02/19/2024   INR 1.1 08/24/2008   Lipid Panel    Component Value Date/Time   CHOL 170 02/20/2024  0626   TRIG 79 02/20/2024 0626   HDL 36 (L) 02/20/2024 0626   CHOLHDL 4.7 02/20/2024 0626   VLDL 16 02/20/2024 0626   LDLCALC 118 (H) 02/20/2024 0626   HgbA1C  Lab Results  Component Value Date   HGBA1C 6.3 (H) 02/21/2024   Alcohol Level    Component Value Date/Time   [facility] <15 02/19/2024 2059     SIGNIFICANT DIAGNOSTIC STUDIES VAS US  CAROTID Result Date: 02/22/2024 Carotid Arterial Duplex Study Patient Name:  Nathan [name]  Date of Exam:   02/21/2024 Medical Rec #: [id number]        Accession #:    [id number] Date of Birth: October 04, 1940        Patient Gender: M Patient Age:   83 years Exam Location:  [facility] Procedure:      VAS US  CAROTID Referring Phys: DONNICE SENDER --------------------------------------------------------------------------------  Indications:       CVA, Carotid artery disease and Presented with aphasia and                    confusion. Risk Factors:      Hypertension, hyperlipidemia, Diabetes. Comparison Study:  No prior carotid duplex study                    02/19/24 - CTA showed left bifurcation 40% stenosis. Performing Technologist: Nathan [name] RDMS, RVT  Examination Guidelines: A complete evaluation includes B-mode imaging, spectral Doppler, color Doppler, and power Doppler as needed of all accessible portions of each vessel. Bilateral testing is considered an integral part of a complete examination. Limited examinations for reoccurring indications may be performed as noted.  Right Carotid Findings: +----------+--------+--------+--------+------------------+--------+           PSV cm/sEDV cm/sStenosisPlaque DescriptionComments +----------+--------+--------+--------+------------------+--------+ CCA Prox  61      15                                         +----------+--------+--------+--------+------------------+--------+ CCA Distal86      17              calcific                    +----------+--------+--------+--------+------------------+--------+ ICA Prox  74      17      1-39%   calcific                   +----------+--------+--------+--------+------------------+--------+ ICA Mid  80      27                                         +----------+--------+--------+--------+------------------+--------+ ICA Distal73      30                                         +----------+--------+--------+--------+------------------+--------+ ECA       96                                                 +----------+--------+--------+--------+------------------+--------+ +----------+--------+-------+----------------+-------------------+           PSV cm/sEDV cmsDescribe        Arm Pressure (mmHG) +----------+--------+-------+----------------+-------------------+ Subclavian80             Multiphasic, WNL                    +----------+--------+-------+----------------+-------------------+ +---------+--------+--+--------+--+---------+ VertebralPSV cm/s41EDV cm/s11Antegrade +---------+--------+--+--------+--+---------+  Left Carotid Findings: +----------+--------+--------+--------+------------------+--------+           PSV cm/sEDV cm/sStenosisPlaque DescriptionComments +----------+--------+--------+--------+------------------+--------+ CCA Prox  75      16                                         +----------+--------+--------+--------+------------------+--------+ CCA Distal67      16              calcific                   +----------+--------+--------+--------+------------------+--------+ ICA Prox  75      26      1-39%                              +----------+--------+--------+--------+------------------+--------+ ICA Mid   88      28                                         +----------+--------+--------+--------+------------------+--------+ ICA Distal83      28                                          +----------+--------+--------+--------+------------------+--------+ ECA       84      8                                          +----------+--------+--------+--------+------------------+--------+ +----------+--------+--------+----------------+-------------------+           PSV cm/sEDV cm/sDescribe        Arm Pressure (mmHG) +----------+--------+--------+----------------+-------------------+ [id number]              Multiphasic, WNL                    +----------+--------+--------+----------------+-------------------+ +---------+--------+--+--------+--+---------+ VertebralPSV cm/s45EDV cm/s17Antegrade +---------+--------+--+--------+--+---------+  Summary: Right Carotid: Velocities in the right ICA are consistent with a 1-39% stenosis. Left Carotid: Velocities in the left ICA are consistent with a 1-39% stenosis. Vertebrals:  Bilateral vertebral arteries demonstrate antegrade flow. Subclavians: Normal flow hemodynamics were seen in bilateral subclavian              arteries. *See table(s) above for measurements and observations.  Electronically signed by Eather Popp MD on 02/22/2024 at 8:19:30 AM.    Final    MR BRAIN WO CONTRAST Result Date: 02/21/2024 CLINICAL DATA:  Stroke, follow up EXAM: MRI HEAD WITHOUT CONTRAST TECHNIQUE: Multiplanar, multiecho pulse sequences of the brain and surrounding structures were obtained without intravenous contrast. COMPARISON:  CT the head dated February 20, 2024. FINDINGS: Brain: There is age related cerebral volume loss present. There is mild to moderate periventricular and subcortical white matter disease present. There is no restricted diffusion to indicate acute or recent infarction. There is no evidence of hemorrhage, mass or hydrocephalus. Vascular: Normal vascular flow voids. Skull and upper cervical spine: Normal marrow signal. No osseous lesions. Sinuses/Orbits: Clear paranasal sinuses.  Normal orbits. Other: None. IMPRESSION: 1. Age-related  atrophy and a mild to moderate nonspecific cerebral white matter disease. No apparent acute process. Electronically Signed   By: Evalene Coho M.D.   On: 02/21/2024 10:51   CT HEAD WO CONTRAST ( ) Result Date: 02/20/2024 EXAM: CT HEAD WITHOUT CONTRAST 02/20/2024 09:50:00 PM TECHNIQUE: CT of the head was performed without the administration of intravenous contrast. Automated exposure control, iterative reconstruction, and/or weight based adjustment of the mA/kV was utilized to reduce the radiation dose to as low as reasonably achievable. COMPARISON: 02/19/2024 CLINICAL HISTORY: Stroke, follow up. FINDINGS: BRAIN AND VENTRICLES: No acute hemorrhage. Gray-white differentiation is preserved. No hydrocephalus. No extra-axial collection. No mass effect or midline shift. ORBITS: No acute abnormality. SINUSES: No acute abnormality. SOFT TISSUES AND SKULL: No acute soft tissue abnormality. No skull fracture. IMPRESSION: 1. No acute intracranial abnormality. Electronically signed by: Franky Stanford MD 02/20/2024 11:20 PM EDT RP Workstation: [id number]   ECHOCARDIOGRAM COMPLETE Result Date: 02/20/2024    ECHOCARDIOGRAM REPORT   Patient Name:   TION TSE Date of Exam: 02/20/2024 Medical Rec #:  [id number]       Height:       72.0 in Accession #:    [id number]      Weight:       175.3 lb Date of Birth:  16-Sep-1940       BSA:          2.014 m Patient Age:    82 years        BP:           110/66 mmHg Patient Gender: M               HR:           66 bpm. Exam Location:  Inpatient Procedure: 2D Echo, Color Doppler, Cardiac Doppler and Intracardiac            Opacification Agent (Both Spectral and Color Flow Doppler were            utilized during procedure). Indications:    Stroke i63.9  History:        Patient has prior history of Echocardiogram examinations, most                 recent 03/24/2023. Pacemaker; Risk Factors:Hypertension, Diabetes  and Dyslipidemia.  Sonographer:    Damien Senior RDCS  Referring Phys: [id number] ASHISH ARORA  Sonographer Comments: Technically difficult study limited by patient cooperation. IMPRESSIONS  1. Left ventricular ejection fraction, by estimation, is 60 to 65%. The left ventricle has normal function. The left ventricle has no regional wall motion abnormalities. There is moderate asymmetric left ventricular hypertrophy of the septal segment. Left ventricular diastolic parameters are indeterminate.  2. Right ventricular systolic function is normal. The right ventricular size is normal. There is normal pulmonary artery systolic pressure. The estimated right ventricular systolic pressure is 29.6 mmHg.  3. Left atrial size was mildly dilated.  4. The mitral valve is degenerative. Trivial mitral valve regurgitation. No evidence of mitral stenosis. The mean mitral valve gradient is 2.0 mmHg with average heart rate of 70 bpm. Severe mitral annular calcification.  5. Unable to get gradients on today's study due to limited patient cooperation; Visually on short axis assessment, valve consistent with moderate aortic stenosis (mean gradient 19 mm Hg last year). The aortic valve is calcified. There is moderate calcification of the aortic valve. Aortic valve regurgitation is not visualized.  6. The inferior vena cava is normal in size with greater than 50% respiratory variability, suggesting right atrial pressure of 3 mmHg. Comparison(s): Prior images reviewed side by side. Similar to prior with caveats on aortic valve assessment as noted above. FINDINGS  Left Ventricle: Left ventricular ejection fraction, by estimation, is 60 to 65%. The left ventricle has normal function. The left ventricle has no regional wall motion abnormalities. Definity  contrast agent was given IV to delineate the left ventricular  endocardial borders. The left ventricular internal cavity size was normal in size. There is moderate asymmetric left ventricular hypertrophy of the septal segment. Left ventricular  diastolic parameters are indeterminate. Right Ventricle: The right ventricular size is normal. No increase in right ventricular wall thickness. Right ventricular systolic function is normal. There is normal pulmonary artery systolic pressure. The tricuspid regurgitant velocity is 2.58 m/s, and  with an assumed right atrial pressure of 3 mmHg, the estimated right ventricular systolic pressure is 29.6 mmHg. Left Atrium: Left atrial size was mildly dilated. Right Atrium: Right atrial size was not well visualized. Pericardium: There is no evidence of pericardial effusion. Presence of epicardial fat layer. Mitral Valve: The mitral valve is degenerative in appearance. Severe mitral annular calcification. Trivial mitral valve regurgitation. No evidence of mitral valve stenosis. MV peak gradient, 6.1 mmHg. The mean mitral valve gradient is 2.0 mmHg with average heart rate of 70 bpm. Tricuspid Valve: The tricuspid valve is normal in structure. Tricuspid valve regurgitation is mild. Aortic Valve: Unable to get gradients on today's study due to limited patient cooperation; Visually on short axis assessment, valve consistent with moderate aortic stenosis (mean gradient 19 mm Hg last year). The aortic valve is calcified. There is moderate calcification of the aortic valve. Aortic valve regurgitation is not visualized. Pulmonic Valve: The pulmonic valve was normal in structure. Pulmonic valve regurgitation is trivial. Aorta: The aortic root is normal in size and structure and the ascending aorta was not well visualized. Venous: The inferior vena cava is normal in size with greater than 50% respiratory variability, suggesting right atrial pressure of 3 mmHg. IAS/Shunts: No atrial level shunt detected by color flow Doppler. Additional Comments: A device lead is visualized in the right ventricle and right atrium.  LEFT VENTRICLE PLAX 2D LVIDd:         3.70 cm   Diastology LVIDs:  2.70 cm   LV e' medial:    6.06 cm/s LV PW:          0.90 cm   LV E/e' medial:  12.7 LV IVS:        1.40 cm   LV e' lateral:   5.28 cm/s LVOT diam:     2.00 cm   LV E/e' lateral: 14.6 LV SV:         77 LV SV Index:   38 LVOT Area:     3.14 cm  RIGHT VENTRICLE RV S prime:     14.00 cm/s TAPSE (M-mode): 2.0 cm LEFT ATRIUM             Index LA diam:        3.40 cm 1.69 cm/m LA Vol (A2C):   71.2 ml 35.35 ml/m LA Vol (A4C):   75.5 ml 37.48 ml/m LA Biplane Vol: 74.3 ml 36.89 ml/m  AORTIC VALVE LVOT Vmax:   109.00 cm/s LVOT Vmean:  89.900 cm/s LVOT VTI:    0.245 m  AORTA Ao Root diam: 3.30 cm MITRAL VALVE                TRICUSPID VALVE MV Area (PHT): 1.81 cm     TR Peak grad:   26.6 mmHg MV Area VTI:   2.05 cm     TR Vmax:        258.00 cm/s MV Peak grad:  6.1 mmHg MV Mean grad:  2.0 mmHg     SHUNTS MV Vmax:       1.23 m/s     Systemic VTI:  0.24 m MV Vmean:      65.2 cm/s    Systemic Diam: 2.00 cm MV Decel Time: 419 msec MV E velocity: 77.10 cm/s MV A velocity: 129.00 cm/s MV E/A ratio:  0.60 Stanly Leavens MD Electronically signed by Stanly Leavens MD Signature Date/Time: 02/20/2024/9:57:11 AM    Final    DG Chest 1 View Result Date: 02/20/2024 CLINICAL DATA:  [id number] Aspiration into airway [id number] EXAM: CHEST  1 VIEW COMPARISON:  CT chest 08/26/2008, CT angio chest 02/19/2024 FINDINGS: Left chest wall dual lead pacemaker. The heart and mediastinal contours are unchanged. Atherosclerotic plaque. No focal consolidation. Question nodular interstitial markings within the right lower lobe and left upper lobe. No pleural effusion. No pneumothorax. No acute osseous abnormality. IMPRESSION: 1. Question nodular interstitial markings within the right lower lobe and left upper lobe. Finding may be infectious versus inflammatory. Followup PA and lateral chest X-ray is recommended in 3-4 weeks following therapy to ensure resolution. 2.  Aortic Atherosclerosis (ICD10-I70.0). Electronically Signed   By: Morgane  Naveau M.D.   On: 02/20/2024 00:20   CT ANGIO  HEAD NECK W WO CM (CODE STROKE) Result Date: 02/19/2024 CLINICAL DATA:  Neuro deficit, concern for stroke, aphasia, vision loss. EXAM: CT ANGIOGRAPHY HEAD AND NECK WITH AND WITHOUT CONTRAST TECHNIQUE: Multidetector CT imaging of the head and neck was performed using the standard protocol during bolus administration of intravenous contrast. Multiplanar CT image reconstructions and MIPs were obtained to evaluate the vascular anatomy. Carotid stenosis measurements (when applicable) are obtained utilizing NASCET criteria, using the distal internal carotid diameter as the denominator. RADIATION DOSE REDUCTION: This exam was performed according to the departmental dose-optimization program which includes automated exposure control, adjustment of the mA and/or kV according to patient size and/or use of iterative reconstruction technique. CONTRAST:  OMNIPAQUE  IOHEXOL  350 MG/ML SOLN COMPARISON:  Same-day head CT. FINDINGS:  CTA NECK FINDINGS Aortic arch: Standard configuration of the aortic arch. Imaged portion shows no evidence of aneurysm or dissection. Atherosclerosis of the partially visualized aortic arch. No significant stenosis of the major arch vessel origins. Pulmonary arteries: Not well visualized. Subclavian arteries: Patent bilaterally. Circumferential calcified atherosclerosis at the origin of the left subclavian artery without significant stenosis. Right carotid system: Patent from the origin to the skull base. Mild atherosclerosis of the common carotid artery. Calcified atherosclerosis at the carotid bifurcation without hemodynamically significant stenosis. No evidence of dissection. Left carotid system: Patent from the origin to the skull base. Mild atherosclerosis along the distal common carotid artery. There is bulky calcified atherosclerosis at the carotid bifurcation resulting in approximately 40% stenosis at the origin of the cervical ICA. No evidence of dissection. Vertebral arteries: Codominant.  No evidence of dissection, stenosis (50% or greater), or occlusion. Skeleton: No acute findings. Degenerative changes in the cervical spine. Other neck: The visualized airway is patent. No cervical lymphadenopathy. Upper chest: The visualized lung apices are unremarkable. Partially visualized left chest wall pacer device. Review of the MIP images confirms the above findings CTA HEAD FINDINGS ANTERIOR CIRCULATION: The intracranial ICAs are patent bilaterally. Atherosclerosis of the carotid siphons. Mild stenosis of the bilateral cavernous ICAs and left paraclinoid ICA. No significant stenosis, proximal occlusion, aneurysm, or vascular malformation. MCAs: Patent bilaterally. Diminutive appearance of M2 superior division branches of the left MCA without occlusion. Additional somewhat diminutive M2 inferior division branches of the left MCA. ACAs: The anterior cerebral arteries are patent bilaterally. POSTERIOR CIRCULATION: No significant stenosis, proximal occlusion, aneurysm, or vascular malformation. PCAs: The posterior cerebral arteries are patent bilaterally. Pcomm: Not well visualized. SCAs: The superior cerebellar arteries are patent bilaterally. Basilar artery: Patent AICAs: Patent PICAs: Patent Vertebral arteries: The intracranial vertebral arteries are patent. Venous sinuses: As permitted by contrast timing, patent. Anatomic variants: None Review of the MIP images confirms the above findings IMPRESSION: No large vessel occlusion. Diminutive appearance of multiple M2 branches of the left MCA without occlusion. Atherosclerosis of the carotid siphons resulting in mild stenosis. Atherosclerosis at the left carotid bifurcation resulting in approximately 40% stenosis. Aortic Atherosclerosis (ICD10-I70.0). These results were called by telephone at the time of interpretation on 02/19/2024 at 8:48 pm to provider [facility] , who verbally acknowledged these results. Electronically Signed   By: Donnice Mania M.D.   On:  02/19/2024 21:04   CT HEAD CODE STROKE WO CONTRAST Result Date: 02/19/2024 CLINICAL DATA:  Code stroke. Neuro deficit, concern for stroke, aphasia, vision problems. EXAM: CT HEAD WITHOUT CONTRAST TECHNIQUE: Contiguous axial images were obtained from the base of the skull through the vertex without intravenous contrast. RADIATION DOSE REDUCTION: This exam was performed according to the departmental dose-optimization program which includes automated exposure control, adjustment of the mA and/or kV according to patient size and/or use of iterative reconstruction technique. COMPARISON:  CT head 08/03/2021. FINDINGS: Brain: No acute intracranial hemorrhage. No CT evidence of acute infarct. Nonspecific hypoattenuation in the periventricular and subcortical white matter favored to reflect chronic microvascular ischemic changes. No edema, mass effect, or midline shift. The basilar cisterns are patent. Ventricles: The ventricles are normal. Vascular: Atherosclerotic calcifications of the carotid siphons. No hyperdense vessel. Skull: No acute or aggressive finding. Orbits: Orbits are symmetric. Sinuses: The visualized paranasal sinuses are clear. Other: Mastoid air cells are clear. ASPECTS [facility] Stroke Program Early CT Score) - Ganglionic level infarction (caudate, lentiform nuclei, internal capsule, insula, M1-M3 cortex): 7 - Supraganglionic infarction (M4-M6 cortex):  3 Total score (0-10 with 10 being normal): 10 IMPRESSION: 1. No CT evidence of acute intracranial abnormality. 2. Chronic microvascular ischemic changes. 3. ASPECTS is 10 These results were communicated to Dr. Arora at 8:47 pm on 02/19/2024 by text page via the [facility] messaging system. Electronically Signed   By: Donnice Mania M.D.   On: 02/19/2024 20:47   CUP PACEART REMOTE DEVICE CHECK Result Date: 02/15/2024 Pacemaker: Scheduled remote reviewed. Normal device function.  Presenting rhythm: AP/VP, AS/VP Next remote 91 days. ML, CVRS      HISTORY OF  PRESENT ILLNESS 83 y.o. patient with history of complete AV block status post pacemaker, diabetes, hypertension and hyperlipidemia was admitted with acute onset aphasia.  HOSPITAL COURSE Patient was given TNK to treat potential stroke, and speech deficits improved.  MRI the following day revealed no acute abnormality, and this was likely a stroke aborted by TNK.  Patient did have atherosclerosis at the left carotid bifurcation and was seen by vascular surgery.  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 Small vessel disease.  ASPECTS 10.    CTA head & neck no for LVO, diminutive appearance of multiple left MCA M2 branches without occlusion, atherosclerosis of bilateral carotid siphons, atherosclerosis at left carotid bifurcation resulting in 40% stenosis with high risk plqaue MRI no acute abnormality, age-related atrophy and chronic ischemic white matter changes Pacemaker interrogation no A-fib seen 2D Echo EF 60 to 65%, no PFO CUS b/l 1-39% stenosis LDL 118 HgbA1c 6.3 UDS neg VTE prophylaxis -SCDs aspirin  81 mg daily prior to admission, now on aspirin  81 mg daily and Plavix  75 mg daily for 3 months and the plavix  alone Therapy recommendations:  outpt PT Disposition: Pending   Left ICA athero CTA head and neck atherosclerosis at left carotid bifurcation resulting in 40% stenosis Seems to be high risk plaque based on morphology CUS no significant stenosis Patient seen by VVS and recommend aggressive medical treatment Continue DAPT Outpt follow up with VVS   Hypertension Home meds: Lisinopril  5 mg daily, carvedilol  12.5 mg twice daily Stable Avoid low BP Long term BP goal normotensive   Hyperlipidemia Home meds: Ezetimibe  10 mg daily  LDL 118, goal < 70 Add atorvastatin  40 mg daily Continue home zetia  Continue statin and zetia  at discharge   Diabetes type II  poorly controlled Home meds: Metformin  1000 mg twice daily, nateglinide  120 mg 3 times daily with meals HgbA1c 6.3, goal < 7.0 CBGs SSI Recommend close follow-up with PCP for better DM control   Other Stroke Risk Factors Advanced age   Other Active Problems AVB s/p pacer Hyponatremia Na 131-> 132 Leukocytosis WBC 15.0-> 9.7->7.6, UA neg Urinary retention - unsuccessful foley - now on coud catheter, put on flomax , discontinued catheter   DISCHARGE EXAM PHYSICAL EXAM General:  Alert, well-nourished, well-developed patient in no acute distress Psych:  Mood and affect appropriate for situation CV: Regular rate and rhythm on monitor Respiratory:  Regular, unlabored respirations on room air     NEURO:  Mental Status: Patient is alert and oriented to person, place, time and situation.  He does not have any word finding difficulties today and states that he feels his speech is approaching baseline Speech/Language: speech is clear and fluent without word finding difficulties   Cranial Nerves:  II: PERRL.  III, IV, VI: EOMI. Eyelids elevate symmetrically.  V: Sensation is intact to light  touch and symmetrical to face.  VII: Face is symmetrical resting and smiling VIII: hearing intact to voice. IX, X: Phonation is normal.  XII: tongue is midline without fasciculations. Motor: 5/5 strength to all muscle groups tested.  Tone: is normal and bulk is normal Sensation- Intact to light touch bilaterally.  Coordination: FTN intact bilaterally Gait- deferred   Most Recent NIH  1a Level of Conscious.: 0 1b LOC Questions: 1 1c LOC Commands: 0 2 Best Gaze: 0 3 Visual: 0 4 Facial Palsy: 0 5a Motor Arm - left: 0 5b Motor Arm - Right: 0 6a Motor Leg - Left: 0 6b Motor Leg - Right: 0 7 Limb Ataxia: 0 8 Sensory: 0 9 Best Language: 1 10 Dysarthria: 0 11 Extinct. and Inatten.: 0 TOTAL: 2   Discharge Diet       Diet   Diet heart healthy/carb modified Room service appropriate? Yes  with Assist; Fluid consistency: Thin   liquids  DISCHARGE PLAN Disposition: Home aspirin  81 mg daily and clopidogrel  75 mg daily for secondary stroke prevention for 3 months then clopidogrel  75 mg daily alone. Ongoing stroke risk factor control by Primary Care Physician at time of discharge Follow-up PCP Nathan Heron HERO, MD in 2 weeks. Follow up with Vascular Surgery Follow-up in [facility] in 8 weeks, office to schedule an appointment. Able to see NP in clinic.  35 minutes were spent preparing discharge.  Patient seen and examined by NP/APP with MD. MD to update note as needed.   Jorene Last, DNP, FNP-BC [facility] Pager: [phone]  ATTENDING NOTE: I reviewed above note and agree with the assessment and plan. Pt was seen and examined.   No acute event overnight. Neuro stable. On DAPT and statin. VVS recommend medical management. Pt ready for discharge. Follow up at [facility] and with VVS.   For detailed assessment and plan, please refer to above as I have made changes wherever appropriate.   Nathan Cummins, MD PhD Stroke Neurology 02/22/2024 10:44 PM

## 2024-02-21 NOTE — CV Procedure (Signed)
  Device system confirmed to be MRI conditional, with implant date > 6 weeks ago, and no evidence of abandoned or epicardial leads in review of most recent CXR  Device last cleared by EP Provider: Ozell Passey 02/20/24  Clearance is good through for 1 year as long as parameters remain stable at time of check. If pt undergoes a cardiac device procedure during that time, they should be re-cleared.   Tachy-therapies to be programmed off if applicable with device back to pre-MRI settings after completion of exam.  Medtronic - Programming recommendation received through Medtronic App/Tablet  Rocky Catalan, RT  02/21/2024 9:01 AM

## 2024-02-21 NOTE — Consult Note (Addendum)
 Hospital Consult    Reason for Consult: Left CVA; left carotid stenosis Requesting Physician: Dr. Jerri MRN #:  [id number]  History of Present Illness: This is a 83 y.o. male with past medical history significant for pacemaker for heart block, hypertension, hyperlipidemia, diabetes mellitus.  He is being seen in consultation for evaluation of carotid artery stenosis.  He has been diagnosed with a left-sided CVA presenting with aphasia and confusion.  He was given TNK and admitted to the neuro ICU.  This morning he believes his speech has drastically improved.  He denies any right-sided weakness or vision changes.  Workup included CTA demonstrating 40% stenosis at the left carotid bifurcation.  Prior to admission he was taking 81 mg of aspirin  daily.  He previously had a statin intolerance and has been on Zetia .  He denies tobacco use.  He lives with his wife and his grandson.  Past Medical History:  Diagnosis Date   Atrioventricular block, 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   Qualifier: Diagnosis of  By: Lavona, MD, CODY Agent     DM (diabetes mellitus) (HCC)    HTN (hypertension)    Hyperlipidemia    Nephrolithiasis    Pacemaker-Medtronic 05/10/2011   Medtronic device implanted January 2010    Vertigo     Past Surgical History:  Procedure Laterality Date   APPENDECTOMY     PACEMAKER PLACEMENT     PPM GENERATOR CHANGEOUT N/A 11/05/2021   Procedure: PPM GENERATOR CHANGEOUT;  Surgeon: Fernande Elspeth BROCKS, MD;  Location: [facility] INVASIVE CV LAB;  Service: Cardiovascular;  Laterality: N/A;    Allergies  Allergen Reactions   Actos [Pioglitazone] Other (See Comments)    Achilles pain Ocular symptoms (twice)   Benicar [Olmesartan] Swelling    Hypotension    Diovan [Valsartan] Other (See Comments)    Dizziness    Flexeril [Cyclobenzaprine] Other (See Comments)    Unknown reaction   Niacin And Related Itching and Other (See Comments)    Flushing    Niaspan [Niacin] Other (See Comments)     Flushing   Sulfonamide Derivatives Other (See Comments)    Unknown reaction    Prior to Admission medications   Medication Sig Start Date End Date Taking? Authorizing Provider  aspirin  81 MG tablet Take 81 mg by mouth at bedtime.   Yes [provider]  carvedilol  (COREG ) 12.5 MG tablet TAKE 1 TABLET BY MOUTH 2 TIMES A DAY **TAKE MORNING DOSE IF SYSTOLIC BLOOD PRESSURE IS 110 OR HIGHER** Patient taking differently: Take 12.5 mg by mouth 2 (two) times daily as needed (SBP >= 110). 01/29/24  Yes Fernande Elspeth BROCKS, MD  ezetimibe  (ZETIA ) 10 MG tablet Take 1 tablet (10 mg total) by mouth daily. Patient taking differently: Take 10 mg by mouth at bedtime. 09/05/23  Yes Johnny Garnette LABOR, MD  lisinopril  (ZESTRIL ) 5 MG tablet Take 1 tablet (5 mg total) by mouth daily. Patient taking differently: Take 5 mg by mouth at bedtime. 11/08/23  Yes Ozell Heron HERO, MD  metFORMIN  (GLUCOPHAGE ) 500 MG tablet Take 2 tablets (1,000 mg total) by mouth 2 (two) times daily with a meal. 09/05/23  Yes Johnny Garnette LABOR, MD  nateglinide  (STARLIX ) 120 MG tablet Take 1 tablet (120 mg total) by mouth 3 (three) times daily with meals. 09/05/23  Yes Johnny Garnette LABOR, MD  triamcinolone  cream (KENALOG ) 0.1 % Apply 1 Application topically 2 (two) times daily as needed (eczema). 07/11/23  Yes Johnny Garnette LABOR, MD  Accu-Chek FastClix Lancets MISC Use as directed four  times a day 09/11/23   Johnny Garnette LABOR, MD    Social History   Socioeconomic History   Marital status: Married    Spouse name: Not on file   Number of children: Not on file   Years of education: Not on file   Highest education level: Not on file  Occupational History   Not on file  Tobacco Use   Smoking status: Never   Smokeless tobacco: Never  Vaping Use   Vaping status: Never Used  Substance and Sexual Activity   Alcohol use: Not Currently    Comment: occasional   Drug use: No   Sexual activity: Not Currently  Other Topics Concern   Not on file  Social History  Narrative   Not on file   Social Drivers of Health   Financial Resource Strain: Low Risk  (01/01/2024)   Overall Financial Resource Strain (CARDIA)    Difficulty of Paying Living Expenses: Not hard at all  Food Insecurity: Unknown (02/20/2024)   Hunger Vital Sign    Worried About Running Out of Food in the Last Year: Not on file    Ran Out of Food in the Last Year: Never true  Transportation Needs: No Transportation Needs (02/20/2024)   PRAPARE - Administrator, Civil Service (Medical): No    Lack of Transportation (Non-Medical): No  Physical Activity: Inactive (01/01/2024)   Exercise Vital Sign    Days of Exercise per Week: 0 days    Minutes of Exercise per Session: 0 min  Stress: No Stress Concern Present (01/01/2024)   [employer] of Occupational Health - Occupational Stress Questionnaire    Feeling of Stress : Not at all  Social Connections: Moderately Isolated (02/20/2024)   Social Connection and Isolation Panel    Frequency of Communication with Friends and Family: More than three times a week    Frequency of Social Gatherings with Friends and Family: More than three times a week    Attends Religious Services: Never    Database administrator or Organizations: No    Attends Banker Meetings: Never    Marital Status: Married  Catering manager Violence: Not At Risk (02/20/2024)   Humiliation, Afraid, Rape, and Kick questionnaire    Fear of Current or Ex-Partner: No    Emotionally Abused: No    Physically Abused: No    Sexually Abused: No     Family History  Problem Relation Age of Onset   Cancer - Colon Father     ROS: Otherwise negative unless mentioned in HPI  Physical Examination  Vitals:   02/21/24 0800 02/21/24 0900  BP: 119/62 126/67  Pulse: 60 60  Resp: (!) 23 13  Temp:    SpO2: 99% 100%   Body mass index is 23.77 kg/m.  General:  WDWN in NAD Gait: Not observed HENT: WNL, normocephalic Pulmonary: normal non-labored  breathing, without Rales, rhonchi,  wheezing Cardiac: regular Abdomen:  soft, NT/ND, no masses Skin: without rashes Vascular Exam/Pulses: palpable radial pulses; palpable ATA pulses Extremities: without ischemic changes, without Gangrene , without cellulitis; without open wounds;  Musculoskeletal: no muscle wasting or atrophy  Neurologic: A&O X 3; still having some word finding difficulty however able to carry conversation Psychiatric:  The pt has Normal affect. Lymph:  Unremarkable  CBC    Component Value Date/Time   WBC 9.7 02/21/2024 0606   RBC 4.14 (L) 02/21/2024 0606   HGB 13.0 02/21/2024 0606   HGB 14.5 10/29/2021 1603  HCT 36.8 (L) 02/21/2024 0606   HCT 41.3 10/29/2021 1603   PLT 198 02/21/2024 0606   PLT 268 10/29/2021 1603   MCV 88.9 02/21/2024 0606   MCV 91 10/29/2021 1603   MCH 31.4 02/21/2024 0606   MCHC 35.3 02/21/2024 0606   RDW 12.9 02/21/2024 0606   RDW 11.9 10/29/2021 1603   LYMPHSABS 1.4 02/19/2024 2059   MONOABS 1.3 (H) 02/19/2024 2059   EOSABS 0.1 02/19/2024 2059   BASOSABS 0.1 02/19/2024 2059    BMET    Component Value Date/Time   NA 132 (L) 02/21/2024 0606   NA 138 10/29/2021 1603   K 3.9 02/21/2024 0606   CL 100 02/21/2024 0606   CO2 21 (L) 02/21/2024 0606   GLUCOSE 137 (H) 02/21/2024 0606   BUN 12 02/21/2024 0606   BUN 13 10/29/2021 1603   CREATININE 0.80 02/21/2024 0606   CALCIUM  8.7 (L) 02/21/2024 0606   GFRNONAA >60 02/21/2024 0606   GFRAA >60 05/18/2017 1521    COAGS: Lab Results  Component Value Date   INR 1.1 02/19/2024   INR 1.1 08/24/2008     Non-Invasive Vascular Imaging:   CTA demonstrating 40% stenosis at the left carotid bifurcation    ASSESSMENT/PLAN: This is a 83 y.o. male with left-sided CVA  Mr. Acklin is an 83 year old male who experienced a left-sided CVA and received TNK and was admitted to the neuro ICU.  Workup included CTA neck demonstrating 40% stenosis at the left carotid bifurcation.  Subjectively,  speech has drastically improved.  He will be started on antiplatelets today.  Recommend evaluation by pharmacy for statin recommendation given prior intolerance.  We will check a carotid duplex to further define stenosis and determine treatment plan surgical versus medical management.  On-call vascular surgeon Dr. Magda will evaluate the patient later today and provide further treatment plans.   Donnice Sender PA-C [facility] [phone]  VASCULAR STAFF ADDENDUM: I have independently interviewed and examined the patient. I agree with the above.  Agree with Dr. Jerri - CTA appears worse than as read.  Plan check carotid duplex to see if disease is worse than 50%.  If both CTA and duplex suggest <50% stenosis, will recommend medical therapy only (DAPT, high intensity statin therapy).  Debby SAILOR. Magda, MD [facility] Phone Number: [phone] 02/21/2024 11:17 AM

## 2024-02-21 NOTE — Plan of Care (Signed)
  Problem: Education: Goal: Knowledge of disease or condition will improve Outcome: Progressing   Problem: Ischemic Stroke/TIA Tissue Perfusion: Goal: Complications of ischemic stroke/TIA will be minimized Outcome: Progressing   Problem: Coping: Goal: Will verbalize positive feelings about self Outcome: Progressing

## 2024-02-21 NOTE — Progress Notes (Signed)
 Carotid duplex  has been completed. Refer to [facility] under chart review to view preliminary results.   02/21/2024  4:50 PM Nathan [name], [name] ;

## 2024-02-21 NOTE — Progress Notes (Addendum)
 STROKE TEAM PROGRESS NOTE    SIGNIFICANT HOSPITAL EVENTS 7/21: Patient admitted with aphasia and given TNK to treat possible stroke 7/23: Patient transferred out of the ICU  INTERIM HISTORY/SUBJECTIVE Patient has remained hemodynamically stable and afebrile overnight.  He reports continued improvement in his speech.  MRI brain today was negative for acute infarct.  Will discontinue Foley catheter and transfer patient out of the ICU today.  OBJECTIVE  CBC    Component Value Date/Time   WBC 9.7 02/21/2024 0606   RBC 4.14 (L) 02/21/2024 0606   HGB 13.0 02/21/2024 0606   HGB 14.5 10/29/2021 1603   HCT 36.8 (L) 02/21/2024 0606   HCT 41.3 10/29/2021 1603   PLT 198 02/21/2024 0606   PLT 268 10/29/2021 1603   MCV 88.9 02/21/2024 0606   MCV 91 10/29/2021 1603   MCH 31.4 02/21/2024 0606   MCHC 35.3 02/21/2024 0606   RDW 12.9 02/21/2024 0606   RDW 11.9 10/29/2021 1603   LYMPHSABS 1.4 02/19/2024 2059   MONOABS 1.3 (H) 02/19/2024 2059   EOSABS 0.1 02/19/2024 2059   BASOSABS 0.1 02/19/2024 2059    BMET    Component Value Date/Time   NA 132 (L) 02/21/2024 0606   NA 138 10/29/2021 1603   K 3.9 02/21/2024 0606   CL 100 02/21/2024 0606   CO2 21 (L) 02/21/2024 0606   GLUCOSE 137 (H) 02/21/2024 0606   BUN 12 02/21/2024 0606   BUN 13 10/29/2021 1603   CREATININE 0.80 02/21/2024 0606   CALCIUM  8.7 (L) 02/21/2024 0606   EGFR 86 10/29/2021 1603   GFRNONAA >60 02/21/2024 0606    IMAGING past 24 hours MR BRAIN WO CONTRAST Result Date: 02/21/2024 CLINICAL DATA:  Stroke, follow up EXAM: MRI HEAD WITHOUT CONTRAST TECHNIQUE: Multiplanar, multiecho pulse sequences of the brain and surrounding structures were obtained without intravenous contrast. COMPARISON:  CT the head dated February 20, 2024. FINDINGS: Brain: There is age related cerebral volume loss present. There is mild to moderate periventricular and subcortical white matter disease present. There is no restricted diffusion to indicate  acute or recent infarction. There is no evidence of hemorrhage, mass or hydrocephalus. Vascular: Normal vascular flow voids. Skull and upper cervical spine: Normal marrow signal. No osseous lesions. Sinuses/Orbits: Clear paranasal sinuses.  Normal orbits. Other: None. IMPRESSION: 1. Age-related atrophy and a mild to moderate nonspecific cerebral white matter disease. No apparent acute process. Electronically Signed   By: Evalene Coho M.D.   On: 02/21/2024 10:51   CT HEAD WO CONTRAST ( ) Result Date: 02/20/2024 EXAM: CT HEAD WITHOUT CONTRAST 02/20/2024 09:50:00 PM TECHNIQUE: CT of the head was performed without the administration of intravenous contrast. Automated exposure control, iterative reconstruction, and/or weight based adjustment of the mA/kV was utilized to reduce the radiation dose to as low as reasonably achievable. COMPARISON: 02/19/2024 CLINICAL HISTORY: Stroke, follow up. FINDINGS: BRAIN AND VENTRICLES: No acute hemorrhage. Gray-white differentiation is preserved. No hydrocephalus. No extra-axial collection. No mass effect or midline shift. ORBITS: No acute abnormality. SINUSES: No acute abnormality. SOFT TISSUES AND SKULL: No acute soft tissue abnormality. No skull fracture. IMPRESSION: 1. No acute intracranial abnormality. Electronically signed by: Franky Stanford MD 02/20/2024 11:20 PM EDT RP Workstation: [id number]    Vitals:   02/21/24 0600 02/21/24 0700 02/21/24 0800 02/21/24 0900  BP: 110/66 (!) 99/57 119/62 126/67  Pulse: 66 61 60 60  Resp: 15 14 (!) 23 13  Temp:    97.6 F (36.4 C)  TempSrc:    Oral  SpO2: 98% 97% 99% 100%  Weight:      Height:         PHYSICAL EXAM General:  Alert, well-nourished, well-developed patient in no acute distress Psych:  Mood and affect appropriate for situation CV: Regular rate and rhythm on monitor Respiratory:  Regular, unlabored respirations on room air   NEURO:  Mental Status: Patient is alert and oriented to person, place, time  and situation.  He does not have any word finding difficulties today and states that he feels his speech is approaching baseline Speech/Language: speech is clear and fluent without word finding difficulties  Cranial Nerves:  II: PERRL.  III, IV, VI: EOMI. Eyelids elevate symmetrically.  V: Sensation is intact to light touch and symmetrical to face.  VII: Face is symmetrical resting and smiling VIII: hearing intact to voice. IX, X: Phonation is normal.  XII: tongue is midline without fasciculations. Motor: 5/5 strength to all muscle groups tested.  Tone: is normal and bulk is normal Sensation- Intact to light touch bilaterally.  Coordination: FTN intact bilaterally Gait- deferred  Most Recent NIH  1a Level of Conscious.: 0 1b LOC Questions: 1 1c LOC Commands: 0 2 Best Gaze: 0 3 Visual: 0 4 Facial Palsy: 0 5a Motor Arm - left: 0 5b Motor Arm - Right: 0 6a Motor Leg - Left: 0 6b Motor Leg - Right: 0 7 Limb Ataxia: 0 8 Sensory: 0 9 Best Language: 1 10 Dysarthria: 0 11 Extinct. and Inatten.: 0 TOTAL: 2   ASSESSMENT/PLAN  [name] is a 83 y.o. male with history of complete AV block status post pacemaker, diabetes, hypertension and hyperlipidemia admitted for acute onset aphasia.  He was given TNK to treat stroke, and speech deficits have improved.  Awaiting MRI later today.  Will have pacemaker interrogated today.  NIH on Admission 5  Left brain stroke aborted by TNK, etiology uncertain, probably large vessel disease from left ICA athero and stenosis  Code Stroke CT head No acute abnormality. Small vessel disease.  ASPECTS 10.    CTA head & neck no for LVO, diminutive appearance of multiple left MCA M2 branches without occlusion, atherosclerosis of bilateral carotid siphons, atherosclerosis at left carotid bifurcation resulting in 40% stenosis with high risk plqaue MRI no acute abnormality, age-related atrophy and chronic ischemic white matter changes Pacemaker  interrogation no A-fib seen 2D Echo EF 60 to 65%, no PFO CUS b/l 1-39% stenosis LDL 118 HgbA1c 6.3 UDS neg VTE prophylaxis -SCDs aspirin  81 mg daily prior to admission, now on aspirin  81 mg daily and Plavix  75 mg daily for 3 months and the plavix  alone Therapy recommendations:  outpt PT Disposition: Pending  Left ICA athero CTA head and neck atherosclerosis at left carotid bifurcation resulting in 40% stenosis Seems to be high risk plaque based on morphology CUS no significant stenosis Patient seen by VVS and recommend aggressive medical treatment Continue DAPT  Hypertension Home meds: Lisinopril  5 mg daily, carvedilol  12.5 mg twice daily Stable Avoid low BP Long term BP goal normotensive  Hyperlipidemia Home meds: Ezetimibe  10 mg daily  LDL 118, goal < 70 Add atorvastatin  40 mg daily Continue home zetia  Continue statin and zetia  at discharge  Diabetes type II poorly controlled Home meds: Metformin  1000 mg twice daily, nateglinide  120 mg 3 times daily with meals HgbA1c 6.3, goal < 7.0 CBGs SSI Recommend close follow-up with PCP for better DM control  Other Stroke Risk Factors Advanced age  Other Active Problems AVB  s/p pacer Hyponatremia Na 131-> 132 Leukocytosis WBC 15.0-> 9.7, UA neg Urinary retention - unsuccessful foley - now on coud catheter, put on flomax , will discontinue catheter at attempt trial of void  Hospital day # 2  Patient seen by NP with MD, MD to addend note as needed. Cortney E Everitt Clint Kill , MSN, AGACNP-BC [facility] See Amion for schedule and pager information 02/21/2024 11:51 AM  ATTENDING NOTE: I reviewed above note and agree with the assessment and plan. Pt was seen and examined.   VVS PA is at the beside for evaluation. Pt lying in bed, AAO x 3, speech back to baseline, much improved from yesterday. Moving all extremities. MRI showed no acute infarct. VVS on board, checked CUS unremarkable, recommend aggressive medical  management first. On DAPT and statin and zetia   For detailed assessment and plan, please refer to above as I have made changes wherever appropriate.   Ary Cummins, MD PhD Stroke Neurology 02/21/2024 10:22 PM   This patient is critically ill due to stroke s/p TNK, left ICA significant athero, AVB on pacer and at significant risk of neurological worsening, death form recurrent stroke, bleeding from TNK, heart failure. This patient's care requires constant monitoring of vital signs, hemodynamics, respiratory and cardiac monitoring, review of multiple databases, neurological assessment, discussion with family, other specialists and medical decision making of high complexity. I spent 30 minutes of neurocritical care time in the care of this patient.

## 2024-02-21 NOTE — Progress Notes (Signed)
 Speech Language Pathology Treatment: Cognitive-Linguistic  Patient Details Name: Nathan [name] MRN: [id number] DOB: 1941-06-07 Today's Date: 02/21/2024 Time: 1450-1510 SLP Time Calculation (min) (ACUTE ONLY): 20 min  Assessment / Plan / Recommendation Clinical Impression  Skilled therapy session focused on aphasia goals. Patient and family report significant improvement from prior day. Patient oriented x4 and shared biographical information regarding family, job, and medical hx with no apparent word finding difficulty. Patient completed confrontational and responsive naming tasks with 100% accuracy independently. Patient with x2 instances of mild phonemic paraphasias during repetition of longer phrases, though able to self correct. Receptive language WFL. Patient left in bed with wife present. If spontaneous recovery continues, patient will likely not require OP ST at d/c.  HPI HPI: Nathan [name] is an 83 yo male presenting to ED 7/21 with sudden onset of word finding difficulty and confusion. He was hypertensive upon arrival and received TNK. CTH showed no LVO and diminutive appearance of multiple M2 branches of the L MCA without occlusion. PMH includes complete AV block s/p pacemaker, DM, HTN, HLD      SLP Plan  Continue with current plan of care      Recommendations         Oral care BID   PRN Aphasia (R47.01)     Continue with current plan of care     Naamah Boggess M.A., CCC-SLP 02/21/2024, 3:12 PM

## 2024-02-21 NOTE — Progress Notes (Addendum)
 PT Cancellation Note  Patient Details Name: Nathan [name] MRN: [id number] DOB: [birth year]   Cancelled Treatment:    Reason Eval/Treat Not Completed: Patient at procedure or test/unavailable. PT attempted x 3. Pt in MRI then undergoing EKG. On third attempt, pt sleeping soundly and unable to fully rouse for participation.   Erven Sari Shaker 02/21/2024, 12:03 PM

## 2024-02-22 ENCOUNTER — Other Ambulatory Visit: Payer: Self-pay | Admitting: Neurology

## 2024-02-22 ENCOUNTER — Other Ambulatory Visit (HOSPITAL_COMMUNITY): Payer: Self-pay

## 2024-02-22 DIAGNOSIS — I639 Cerebral infarction, unspecified: Secondary | ICD-10-CM

## 2024-02-22 DIAGNOSIS — R29702 NIHSS score 2: Secondary | ICD-10-CM | POA: Diagnosis not present

## 2024-02-22 DIAGNOSIS — I6350 Cerebral infarction due to unspecified occlusion or stenosis of unspecified cerebral artery: Secondary | ICD-10-CM | POA: Diagnosis not present

## 2024-02-22 LAB — BASIC METABOLIC PANEL WITH GFR
Anion gap: 12 (ref 5–15)
BUN: 10 mg/dL (ref 8–23)
CO2: 22 mmol/L (ref 22–32)
Calcium: 8.7 mg/dL — ABNORMAL LOW (ref 8.9–10.3)
Chloride: 98 mmol/L (ref 98–111)
Creatinine, Ser: 0.86 mg/dL (ref 0.61–1.24)
GFR, Estimated: >60 mL/min (ref >60)
Glucose, Bld: 139 mg/dL — ABNORMAL HIGH (ref 70–99)
Potassium: 3.7 mmol/L (ref 3.5–5.1)
Sodium: 132 mmol/L — ABNORMAL LOW (ref 135–145)

## 2024-02-22 LAB — CBC
HCT: 38.1 % — ABNORMAL LOW (ref 39.0–52.0)
Hemoglobin: 13.2 g/dL (ref 13.0–17.0)
MCH: 31 pg (ref 26.0–34.0)
MCHC: 34.6 g/dL (ref 30.0–36.0)
MCV: 89.4 fL (ref 80.0–100.0)
Platelets: 201 K/uL (ref 150–400)
RBC: 4.26 MIL/uL (ref 4.22–5.81)
RDW: 12.9 % (ref 11.5–15.5)
WBC: 7.6 K/uL (ref 4.0–10.5)
nRBC: 0 % (ref 0.0–0.2)

## 2024-02-22 LAB — GLUCOSE, CAPILLARY: Glucose-Capillary: 163 mg/dL — ABNORMAL HIGH (ref 70–99)

## 2024-02-22 MED ORDER — CLOPIDOGREL BISULFATE 75 MG PO TABS
75.0000 mg | ORAL_TABLET | Freq: Every day | ORAL | 1 refills | Status: DC
  Filled 2024-02-22: qty 30, 30d supply, fill #0

## 2024-02-22 MED ORDER — ATORVASTATIN CALCIUM 40 MG PO TABS
40.0000 mg | ORAL_TABLET | Freq: Every day | ORAL | 1 refills | Status: DC
  Filled 2024-02-22 – 2024-03-25 (×2): qty 30, 30d supply, fill #0

## 2024-02-22 MED ORDER — ASPIRIN 81 MG PO CHEW
81.0000 mg | CHEWABLE_TABLET | Freq: Every day | ORAL | 0 refills | Status: DC
  Filled 2024-02-22: qty 90, 90d supply, fill #0

## 2024-02-22 NOTE — Progress Notes (Signed)
 Patient and family have been provided discharge instructions to include medication changes, follow up appointments and exercise. Family verbalizes understanding of instructions.

## 2024-02-22 NOTE — Progress Notes (Signed)
  Progress Note    02/22/2024 7:39 AM * No surgery found *  Subjective:  overall he says he feels back to his baseline. Until about Wednesday morning he said he still had some peripheral vision loss in left eye but that has also resolved. Eager to go home   Vitals:   02/21/24 2354 02/22/24 0410  BP: (!) 117/54 104/61  Pulse: 67 64  Resp: 17 17  Temp: 97.8 F (36.6 C) (!) 97.5 F (36.4 C)  SpO2: 97% 96%   Physical Exam: Cardiac:  regular Lungs:  non labored Extremities:  moving all extremities without deficits Abdomen:  soft Neurologic: alert and oriented. Speech coherent. Tongue midline  CBC    Component Value Date/Time   WBC 7.6 02/22/2024 0333   RBC 4.26 02/22/2024 0333   HGB 13.2 02/22/2024 0333   HGB 14.5 10/29/2021 1603   HCT 38.1 (L) 02/22/2024 0333   HCT 41.3 10/29/2021 1603   PLT 201 02/22/2024 0333   PLT 268 10/29/2021 1603   MCV 89.4 02/22/2024 0333   MCV 91 10/29/2021 1603   MCH 31.0 02/22/2024 0333   MCHC 34.6 02/22/2024 0333   RDW 12.9 02/22/2024 0333   RDW 11.9 10/29/2021 1603   LYMPHSABS 1.4 02/19/2024 2059   MONOABS 1.3 (H) 02/19/2024 2059   EOSABS 0.1 02/19/2024 2059   BASOSABS 0.1 02/19/2024 2059    BMET    Component Value Date/Time   NA 132 (L) 02/22/2024 0333   NA 138 10/29/2021 1603   K 3.7 02/22/2024 0333   CL 98 02/22/2024 0333   CO2 22 02/22/2024 0333   GLUCOSE 139 (H) 02/22/2024 0333   BUN 10 02/22/2024 0333   BUN 13 10/29/2021 1603   CREATININE 0.86 02/22/2024 0333   CALCIUM  8.7 (L) 02/22/2024 0333   GFRNONAA >60 02/22/2024 0333   GFRAA >60 05/18/2017 1521    INR    Component Value Date/Time   INR 1.1 02/19/2024 2059     Intake/Output Summary (Last 24 hours) at 02/22/2024 0739 Last data filed at 02/21/2024 1832 Gross per 24 hour  Intake --  Output 625 ml  Net -625 ml     Assessment/Plan:  83 y.o. male with left brain stroke with CTA showing 40% left ICA stenosis. Duplex showing 1-39% bilaterally. His symptoms  have resolved. Would recommend medical management with DAPT and high intensity statin therapy.  - Aspirin  and Plavix  x 3 months then Plavix  alone per Neurology - Lipid consult per Pharmacy, previous intolerance to Lipitor with myalgias. On Atorvastatin  and Zetia  at this time - Will arrange follow up in our office in 6 months with repeat carotid duplex   Teretha Damme, PA-C [facility] [phone] 02/22/2024 7:39 AM

## 2024-02-22 NOTE — Progress Notes (Signed)
 PHARMACIST LIPID MONITORING   Nathan [name] is a 83 y.o. male admitted on 02/19/2024 with stroke/PVD.  Pharmacy has been consulted to optimize lipid-lowering therapy with the indication of secondary prevention for clinical ASCVD.  Recent Labs:  Lipid Panel (last 6 months):   Lab Results  Component Value Date   CHOL 170 02/20/2024   TRIG 79 02/20/2024   HDL 36 (L) 02/20/2024   CHOLHDL 4.7 02/20/2024   VLDL 16 02/20/2024   LDLCALC 118 (H) 02/20/2024    Hepatic function panel (last 6 months):   Lab Results  Component Value Date   AST 17 02/19/2024   ALT 16 02/19/2024   ALKPHOS 89 02/19/2024   BILITOT 1.1 02/19/2024    SCr (since admission):   Serum creatinine: 0.86 mg/dL 92/75/74 9666 Estimated creatinine clearance: 73.9 mL/min  Current therapy and lipid therapy tolerance Current lipid-lowering therapy: Zetia  Previous lipid-lowering therapies (if applicable): Niacin Documented or reported allergies or intolerances to lipid-lowering therapies (if applicable): Niacin  Assessment:   Patient prefers no changes in lipid-lowering therapy at this time due to patient being started on Lipitor 40 inpatient.  Plan:    1.Statin intensity (high intensity recommended for all patients regardless of the LDL):  Add or increase statin to high intensity.  2.Add ezetimibe  (if any one of the following):   Not indicated at this time. Patient was on Zetia  PTA.  3.Refer to lipid clinic:   No  4.Follow-up with:  Primary care provider - Ozell Heron HERO, MD  5.Follow-up labs after discharge:  Changes in lipid therapy were made. Check a lipid panel in 8-12 weeks then annually.      Maegan Sherryn Pollino, PharmD PGY-1 Pharmacy Resident [facility] 02/22/2024 1:12 PM

## 2024-02-22 NOTE — TOC Transition Note (Signed)
 Transition of Care [facility]) - Discharge Note   Patient Details  Name: Nathan [name] MRN: [id number] Date of Birth: [birth year]  Transition of Care [facility]) CM/SW Contact:  Andrez JULIANNA George, RN Phone Number: 02/22/2024, 1:23 PM   Clinical Narrative:     Pt is discharging home with outpatient therapy referral sent to [facility]. Information on the AVS. Pt will call to schedule the first appointment.  No new DME needs.  Pt has transportation home.  Final next level of care: OP Rehab Barriers to Discharge: No Barriers Identified   Patient Goals and CMS Choice Patient states their goals for this hospitalization and ongoing recovery are:: return home CMS Medicare.gov Compare Post Acute Care list provided to:: Patient Represenative (must comment) Choice offered to / list presented to : Spouse      Discharge Placement                       Discharge Plan and Services Additional resources added to the After Visit Summary for   In-house Referral: Clinical Social Work Discharge Planning Services: CM Consult Post Acute Care Choice: NA          DME Arranged: N/A         HH Arranged: NA          Social Drivers of Health (SDOH) Interventions SDOH Screenings   Food Insecurity: No Food Insecurity (02/21/2024)  Housing: Low Risk  (02/21/2024)  Transportation Needs: No Transportation Needs (02/20/2024)  Utilities: Not At Risk (02/20/2024)  Alcohol Screen: Low Risk  (01/01/2024)  Depression (PHQ2-9): Low Risk  (01/01/2024)  Financial Resource Strain: Low Risk  (01/01/2024)  Physical Activity: Inactive (01/01/2024)  Social Connections: Moderately Isolated (02/20/2024)  Stress: No Stress Concern Present (01/01/2024)  Tobacco Use: Low Risk  (01/01/2024)  Health Literacy: Adequate Health Literacy (01/01/2024)     Readmission Risk Interventions     No data to display

## 2024-02-22 NOTE — Progress Notes (Signed)
 Physical Therapy Treatment Patient Details Name: Nathan [name] MRN: [id number] DOB: 1941-07-15 Today's Date: 02/22/2024   History of Present Illness 83 yo male presents [facility] ED on 02/19/2024 with sudden onset of difficulty with words and confusion. TNK administered 7/21 at 2000. CT showed no large vessel occlusion and diminutive appearance of multiple M2 branches of the left MCA w/o occlusion. PMH: complete AV block s/p pacemaker, DM, HTN, HLD.    PT Comments  Pt continues to have minor balance, drifting and L innattention.  HEP in standing issued with cues to perform activities slowly to challenge his balance.  Pt able to negotiate x 6 stairs to simulate home environment.  Plan for d/c home with support from family.      If plan is discharge home, recommend the following: A little help with walking and/or transfers;A little help with bathing/dressing/bathroom   Can travel by private vehicle        Equipment Recommendations  None recommended by PT    Recommendations for Other Services       Precautions / Restrictions Precautions Precautions: Fall;ICD/Pacemaker Recall of Precautions/Restrictions: Impaired Precaution/Restrictions Comments: Pacemaker Restrictions Weight Bearing Restrictions Per Provider Order: No     Mobility  Bed Mobility               General bed mobility comments: up in recliner    Transfers Overall transfer level: Modified independent Equipment used: None                    Ambulation/Gait Ambulation/Gait assistance: Independent Gait Distance (Feet): 350 Feet Assistive device: None Gait Pattern/deviations: Step-through pattern, Decreased stride length, Drifts right/left Gait velocity: decr     General Gait Details: Innattention to L remains with minor dirft but able to correct and compensate to negotiate his environment.   Stairs Stairs: Yes Stairs assistance: Supervision Stair Management: One rail Right Number of Stairs:  6 General stair comments: Cues for sequencing and safety. NO LOB and good technique observed.   Wheelchair Mobility     Tilt Bed    Modified Rankin (Stroke Patients Only) Modified Rankin (Stroke Patients Only) Pre-Morbid Rankin Score: No symptoms Modified Rankin: Moderately severe disability     Balance Overall balance assessment: Needs assistance Sitting-balance support: Feet supported Sitting balance-Leahy Scale: Normal       Standing balance-Leahy Scale: Good                              Communication    Cognition Arousal: Alert Behavior During Therapy: WFL for tasks assessed/performed   PT - Cognitive impairments: No apparent impairments                         Following commands: Impaired Following commands impaired: Follows one step commands inconsistently    Cueing Cueing Techniques: Verbal cues, Gestural cues, Tactile cues  Exercises General Exercises - Lower Extremity Hip ABduction/ADduction: AROM, Both, 10 reps, Standing Hip Flexion/Marching: AROM, Both, 10 reps, Standing Heel Raises: AROM, Both, 10 reps, Standing Mini-Sqauts: AROM, Both, 10 reps, Standing Other Exercises Other Exercises: HEP issued: medbridge access code: [email or web address]    General Comments        Pertinent Vitals/Pain Pain Assessment Pain Assessment: No/denies pain    Home Living                          Prior Function  PT Goals (current goals can now be found in the care plan section) Acute Rehab PT Goals Patient Stated Goal: go home when medically ready Potential to Achieve Goals: Good Progress towards PT goals: Progressing toward goals    Frequency    Min 2X/week      PT Plan      Co-evaluation              AM-PAC PT 6 Clicks Mobility   Outcome Measure  Help needed turning from your back to your side while in a flat bed without using bedrails?: A Little Help needed moving from lying on your back to sitting on  the side of a flat bed without using bedrails?: A Little Help needed moving to and from a bed to a chair (including a wheelchair)?: A Little Help needed standing up from a chair using your arms (e.g., wheelchair or bedside chair)?: A Little Help needed to walk in hospital room?: A Little Help needed climbing 3-5 steps with a railing? : A Little 6 Click Score: 18    End of Session Equipment Utilized During Treatment: Gait belt Activity Tolerance: Patient tolerated treatment well Patient left: in chair;with call bell/phone within reach;with family/visitor present Nurse Communication: Mobility status PT Visit Diagnosis: Unsteadiness on feet (R26.81);Other abnormalities of gait and mobility (R26.89)     Time: 8685-8667 PT Time Calculation (min) (ACUTE ONLY): 18 min  Charges:    $Gait Training: 8-22 mins PT General Charges $$ ACUTE PT VISIT: 1 Visit                     Nathan [name] , PTA Acute Rehabilitation Services Office [phone]    Nathan [name] 02/22/2024, 3:03 PM

## 2024-02-23 ENCOUNTER — Other Ambulatory Visit (HOSPITAL_COMMUNITY): Payer: Self-pay

## 2024-02-23 ENCOUNTER — Telehealth: Payer: Self-pay | Admitting: Pharmacy Technician

## 2024-02-23 ENCOUNTER — Telehealth: Payer: Self-pay

## 2024-02-23 DIAGNOSIS — I4891 Unspecified atrial fibrillation: Secondary | ICD-10-CM

## 2024-02-23 MED ORDER — APIXABAN 5 MG PO TABS
5.0000 mg | ORAL_TABLET | Freq: Two times a day (BID) | ORAL | 3 refills | Status: DC
  Filled 2024-02-23: qty 60, 30d supply, fill #0

## 2024-02-23 NOTE — Telephone Encounter (Addendum)
 Alert remote transmission: AT/AF Daily Burden > Threshold 1 AF event 7/24 @ 02:28, duration 1hr , HR 65, burden 2.9%, no OAC - route to triage high alert per protocol Pt presented to the hospital 7/21 code stroke, aborted with TNK per EPIC, DAPT  __________________________________________________________________  [place] and spoke with patient regarding new onset AF w/ duration 1 hr 25 min. Pt unaware and asymptomatic during this time. Patient recently discharged from hospital 7/24 d/t stroke and started on Aspirin  & Plavix . Will stay on Aspirin , start Eliquis, and discontinue Plavix  per Dr. Kennyth. Patient to be seen by APP for F/U on 7/29. Verbalizes understanding and appreciative for call.

## 2024-02-23 NOTE — Telephone Encounter (Signed)
 PAP: Patient assistance application for Eliquis through [employer] ([employer]) has been mailed to pt's home address on file. Provider portion of application will be uploaded to media

## 2024-02-23 NOTE — Care Management Important Message (Signed)
 Important Message  Patient Details  Name: OCTAVIS SHEELER MRN: [id number] Date of Birth: [birth year]   Important Message Given:  Yes - Medicare IM  Patient left Prior to Im delivery will mail a copy to the patient home address.    Raven Harmes 02/23/2024, 4:43 PM

## 2024-02-23 NOTE — Care Management Important Message (Signed)
 Important Message  Patient Details  Name: Nathan [name] MRN: [id number] Date of Birth: 17-Jul-1941   Important Message Given:  Yes - Medicare IM  IM given on 7/24   Claretta Deed 02/23/2024, 8:22 AM

## 2024-02-23 NOTE — Transitions of Care (Post Inpatient/ED Visit) (Signed)
 02/23/2024  Name: Nathan [name] MRN: [id number] DOB: May 06, 1941  Today's TOC FU Call Status: Today's TOC FU Call Status:: Successful TOC FU Call Completed TOC FU Call Complete Date: 02/23/24 Patient's Name and Date of Birth confirmed.  Transition Care Management Follow-up Telephone Call Date of Discharge: 02/22/24 Discharge Facility: [facility]) Type of Discharge: Inpatient Admission Primary Inpatient Discharge Diagnosis:: Acute ischemic stroke How have you been since you were released from the hospital?: Better (relieved- feels well. slept well.) Any questions or concerns?: No  Items Reviewed: Did you receive and understand the discharge instructions provided?: Yes Medications obtained,verified, and reconciled?: Yes (Medications Reviewed) Any new allergies since your discharge?: No Dietary orders reviewed?: Yes Type of Diet Ordered:: low salt diet Do you have support at home?: Yes People in Home [RPT]: spouse, grandchild(ren)  Medications Reviewed Today: Medications Reviewed Today     Reviewed by Rumalda Alan PENNER, RN (Registered Nurse) on 02/23/24 at [phone]  Med List Status: <None>   Medication Order Taking? Sig Documenting Provider Last Dose Status Informant  Accu-Chek FastClix Lancets MISC [id number] Yes Use as directed four times a day Johnny Garnette LABOR, MD  Active Self, Pharmacy Records  aspirin  81 MG chewable tablet [id number] Yes Chew 1 tablet (81 mg total) by mouth daily for 90 doses. Remi Pippin, NP  Active   atorvastatin  (LIPITOR) 40 MG tablet [id number] Yes Take 1 tablet (40 mg total) by mouth daily. Remi Pippin, NP  Active   carvedilol  (COREG ) 12.5 MG tablet [id number] Yes TAKE 1 TABLET BY MOUTH 2 TIMES A DAY **TAKE MORNING DOSE IF SYSTOLIC BLOOD PRESSURE IS 110 OR HIGHER** Fernande Elspeth BROCKS, MD  Active Self, Pharmacy Records  clopidogrel  (PLAVIX ) 75 MG tablet [id number] Yes Take 1 tablet (75 mg total) by mouth daily. Remi Pippin, NP  Active   ezetimibe  (ZETIA ) 10 MG  tablet [id number] Yes Take 1 tablet (10 mg total) by mouth daily. Johnny Garnette LABOR, MD  Active Self, Pharmacy Records  lisinopril  (ZESTRIL ) 5 MG tablet [id number] Yes Take 1 tablet (5 mg total) by mouth daily. Ozell Heron HERO, MD  Active Self, Pharmacy Records  MAGNESIUM PO [id number] Yes Take 1 tablet by mouth daily at 6 (six) AM. [provider]  Active   metFORMIN  (GLUCOPHAGE ) 500 MG tablet [id number] Yes Take 2 tablets (1,000 mg total) by mouth 2 (two) times daily with a meal. Johnny Garnette LABOR, MD  Active Self, Pharmacy Records  nateglinide  (STARLIX ) 120 MG tablet [id number] Yes Take 1 tablet (120 mg total) by mouth 3 (three) times daily with meals. Johnny Garnette LABOR, MD  Active Self, Pharmacy Records           Med Note (COFFELL, [facility] M   Tue Feb 20, 2024 11:15 AM) Pt states he often misses the lunch dose.  triamcinolone  cream (KENALOG ) 0.1 % [id number] Yes Apply 1 Application topically 2 (two) times daily as needed (eczema). Johnny Garnette LABOR, MD  Active Self, Pharmacy Records  VITAMIN D, CHOLECALCIFEROL, PO [id number] Yes Take 1 tablet by mouth daily at 6 (six) AM. [provider]  Active             Home Care and Equipment/Supplies: Were Home Health Services Ordered?:  (referral for [facility] neuro rehab.  Patient to call himself today to schedule.) Any new equipment or medical supplies ordered?: No  Functional Questionnaire: Do you need assistance with bathing/showering or dressing?: No Do you need assistance with meal preparation?: No Do you need assistance with eating?:  No Do you have difficulty maintaining continence: No Do you need assistance with getting out of bed/getting out of a chair/moving?: No Do you have difficulty managing or taking your medications?: No  Follow up appointments reviewed: PCP Follow-up appointment confirmed?: No (will call today) MD Provider Line Number:[phone] Given: No Specialist Hospital Follow-up appointment confirmed?: No Reason  Specialist Follow-Up Not Confirmed: Patient has Specialist Provider Number and will Call for Appointment Do you need transportation to your follow-up appointment?: No Do you understand care options if your condition(s) worsen?: Yes-patient verbalized understanding  Spoke with patient who is doing amazing well. No deficits from stroke.  Reports BP and DM under good control. Self manages well.  Reports that he is not different than he was before going to the hospital.  Reports he is able to make his own phone calls and do everything for himself.  Reports his wife and grandson are his support system. Patient has his instructions and understands follow up appointments and he reports that his plan for today.  Reports that he eats what he wants without any concerns.   Interventions: Reviewed stroke signs and symptoms. Reviewed risk of bleeding and what to do if he falls- seek medical attention Reviewed use of elevation, pressure and ice for treatment for bleeding.  Encouraged patient to call 911 for any signs of stroke. Reviewed and confirmed that patient has all his medications and is taking them as prescribed.  Reviewed importance of follow up with PCP, neurology and rehab.  Encouraged patient to continue to self monitor BP and CBG and report any abnormal findings with MD. Reviewed Afib zones and when to see medical attention for afib.  Reviewed and offered 30 day TOC program and patient declined. Provided my contact information for patient to call me if needed.   Alan Ee, RN, BSN, CEN [employer]- Transition of Care Team.  [facility] [phone]

## 2024-02-25 NOTE — Progress Notes (Unsigned)
 Cardiology Office Note:  .   Date:  02/25/2024  ID:  Nathan JONETTA Lout, DOB 17-Sep-1940, MRN [id number] PCP: Ozell Heron HERO, MD  [facility] Providers Cardiologist:  None { EP: Dr. Fernande  History of Present Illness: .   BALTHAZAR DOOLY is a 83 y.o. male w/PMHx of  HTN, DM, HLD CHB w/PPM TIA/CVA (aborted stroke discussed below)  AFib  He saw Dr. Fernande July 2024, doing well, had previously been seen to have some NSVT > none at this check  Admitted 02/19/24 Left brain stroke aborted by TNK, etiology likely large vessel disease from left ICA athero and stenosis  PPM checked with no AFib noted Vascular team saw him, L carotid w/40% stenosis with high risk plaque rec for aggressive risk factor modification D/c on ASA, plavix  on 02/22/24  SUBSEQUENTLY Device alert for AFib on 02/22/24 that lasted 1hour Dr. Kennyth rec stopping plavix  > start Eliquis , stay on ASA  Today's visit is scheduled as an annual visit AND new onset AF ROS:   Doing OK post discharge Mentions that pre- stroke he has had long hx of C-spine.neck pain and has noticed that more of late Also mentions transient episodes of R face tingling. Said this started the day prior to discharge, has not escalated or changed in any way No recurrent speech difficulty or neuro symptoms  No CP, palpitations, no cardiac awareness  No near syncope or syncope He started Eliquis  is off Plavix   Device information MDT dual chamber PPM implanted 08/25/2008, gen change 11/05/21  Very symptomatic with LOC dependent  Arrhythmia/AAD hx Afib new July 2025  Studies Reviewed: SABRA    EKG not done today  DEVICE interrogation done today and reviewed by myself Battery and lead measurements are good Interrogate all today with only the ONE episode of AFib on 7/24, none prior or since He was quickly symptomatic with LOC RV lead, dependent   02/20/24: TTE 1. Left ventricular ejection fraction, by estimation, is 60 to 65%. The   left ventricle has normal function. The left ventricle has no regional  wall motion abnormalities. There is moderate asymmetric left ventricular  hypertrophy of the septal segment.  Left ventricular diastolic parameters are indeterminate.   2. Right ventricular systolic function is normal. The right ventricular  size is normal. There is normal pulmonary artery systolic pressure. The  estimated right ventricular systolic pressure is 29.6 mmHg.   3. Left atrial size was mildly dilated.   4. The mitral valve is degenerative. Trivial mitral valve regurgitation.  No evidence of mitral stenosis. The mean mitral valve gradient is 2.0 mmHg  with average heart rate of 70 bpm. Severe mitral annular calcification.   5. Unable to get gradients on today's study due to limited patient  cooperation; Visually on short axis assessment, valve consistent with  moderate aortic stenosis (mean gradient 19 mm Hg last year). The aortic  valve is calcified. There is moderate  calcification of the aortic valve. Aortic valve regurgitation is not  visualized.   6. The inferior vena cava is normal in size with greater than 50%  respiratory variability, suggesting right atrial pressure of 3 mmHg.   Comparison(s): Prior images reviewed side by side. Similar to prior with  caveats on aortic valve assessment as noted above.    Risk Assessment/Calculations:    Physical Exam:   VS:  There were no vitals taken for this visit.   Wt Readings from Last 3 Encounters:  02/19/24 175 lb 4.3  oz (79.5 kg)  01/01/24 182 lb 1.6 oz (82.6 kg)  11/08/23 185 lb 8 oz (84.1 kg)    GEN: Well nourished, well developed in no acute distress NECK: No JVD; No carotid bruits CARDIAC: RRR, no murmurs, rubs, gallops RESPIRATORY:  CTA b/l without rales, wheezing or rhonchi  ABDOMEN: Soft, non-tender, non-distended EXTREMITIES: No edema; No deformity   PPM site: is stable, no thinning, fluctuation, tethering  ASSESSMENT AND PLAN: .     PPM intact function no programming changes made He id device dependent and symptomatic with LOC  paroxysmal AFib CHA2DS2Vasc is 5, on eliquis , appropriately dosed No new episodes   I spoke to Dr. Jerri today Despite no MRI evidence of stroke, may have had a very small CVA to account for his occ R facial tingling He agreed with Eliquis , and recommended that we also stop ASA (even in light of some carotid disease) Pt has neuro PT eval 8/1 and neuro f/u 8/25 Advised if any escalation in symptoms to seek attention  Secondary hypercoagulable state 2/2 AFib   Dispo: will have him see Dr. Kennyth next visit ~ 68mo given new AF, sooner if needed  Signed, Charlies Macario Arthur, PA-C

## 2024-02-27 ENCOUNTER — Ambulatory Visit: Attending: Physician Assistant | Admitting: Physician Assistant

## 2024-02-27 ENCOUNTER — Other Ambulatory Visit: Payer: Self-pay | Admitting: Internal Medicine

## 2024-02-27 ENCOUNTER — Encounter: Payer: Self-pay | Admitting: Physician Assistant

## 2024-02-27 VITALS — BP 116/72 | HR 66 | Ht 72.0 in | Wt 180.6 lb

## 2024-02-27 DIAGNOSIS — Z950 Presence of cardiac pacemaker: Secondary | ICD-10-CM | POA: Insufficient documentation

## 2024-02-27 DIAGNOSIS — D6869 Other thrombophilia: Secondary | ICD-10-CM | POA: Insufficient documentation

## 2024-02-27 DIAGNOSIS — I480 Paroxysmal atrial fibrillation: Secondary | ICD-10-CM | POA: Diagnosis not present

## 2024-02-27 LAB — CUP PACEART INCLINIC DEVICE CHECK
Battery Remaining Longevity: 118 mo
Battery Voltage: 3.01 V
Brady Statistic AP VP Percent: 73.49 %
Brady Statistic AP VS Percent: 0.17 %
Brady Statistic AS VP Percent: 24.11 %
Brady Statistic AS VS Percent: 2.24 %
Brady Statistic RA Percent Paced: 73.27 %
Brady Statistic RV Percent Paced: 97.56 %
Date Time Interrogation Session: 20250729163303
Implantable Lead Connection Status: 753985
Implantable Lead Connection Status: 753985
Implantable Lead Implant Date: 20100125
Implantable Lead Implant Date: 20100125
Implantable Lead Location: 753859
Implantable Lead Location: 753860
Implantable Lead Model: 5076
Implantable Lead Model: 5076
Implantable Pulse Generator Implant Date: 20230407
Lead Channel Impedance Value: 361 Ohm
Lead Channel Impedance Value: 399 Ohm
Lead Channel Impedance Value: 513 Ohm
Lead Channel Impedance Value: 570 Ohm
Lead Channel Pacing Threshold Amplitude: 0.5 V
Lead Channel Pacing Threshold Amplitude: 0.75 V
Lead Channel Pacing Threshold Pulse Width: 0.4 ms
Lead Channel Pacing Threshold Pulse Width: 0.4 ms
Lead Channel Sensing Intrinsic Amplitude: 1 mV
Lead Channel Sensing Intrinsic Amplitude: 14.875 mV
Lead Channel Sensing Intrinsic Amplitude: 14.875 mV
Lead Channel Sensing Intrinsic Amplitude: 3.625 mV
Lead Channel Setting Pacing Amplitude: 1.5 V
Lead Channel Setting Pacing Amplitude: 2 V
Lead Channel Setting Pacing Pulse Width: 0.4 ms
Lead Channel Setting Sensing Sensitivity: 2 mV
Zone Setting Status: 755011

## 2024-02-27 MED ORDER — APIXABAN 5 MG PO TABS: 5.0000 mg | ORAL_TABLET | Freq: Two times a day (BID) | ORAL | 10 refills | Status: AC

## 2024-02-27 NOTE — Patient Instructions (Signed)
 Medication Instructions:  Your physician has recommended you make the following change in your medication:   1) STOP aspirin   Clopidogrel  (Plavix ) has been removed from your medication list as well.  *If you need a refill on your cardiac medications before your next appointment, please call your pharmacy*  Follow-Up: At [facility], you and your health needs are our priority.  As part of our continuing mission to provide you with exceptional heart care, our providers are all part of one team.  This team includes your primary Cardiologist (physician) and Advanced Practice Providers or APPs (Physician Assistants and Nurse Practitioners) who all work together to provide you with the care you need, when you need it.  Your next appointment:   3 month(s)  Provider:   Fonda Kitty, MD or [name], PA-C  We recommend signing up for the patient portal called MyChart.  Sign up information is provided on this After Visit Summary.  MyChart is used to connect with patients for Virtual Visits (Telemedicine).  Patients are able to view lab/test results, encounter notes, upcoming appointments, etc.  Non-urgent messages can be sent to your provider as well.    To learn more about what you can do with MyChart, go to [email or web address].

## 2024-02-29 ENCOUNTER — Telehealth: Payer: Self-pay

## 2024-02-29 NOTE — Telephone Encounter (Signed)
 Pt called in stating he was supposed to get a refill on a medication from the recent stroke that he had. Pt would like a call back

## 2024-02-29 NOTE — Therapy (Incomplete)
 OUTPATIENT PHYSICAL THERAPY NEURO EVALUATION   Patient Name: Nathan [name] MRN: [id number] DOB:1941-03-20, 83 y.o., male Today's Date: 03/01/2024   PCP: Dr. 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   Qualifier: Diagnosis of  By: Lavona, MD, CODY Lynwood     DM (diabetes mellitus) (HCC)    HTN (hypertension)    Hyperlipidemia    Nephrolithiasis    Pacemaker-Medtronic 05/10/2011   Medtronic device implanted January 2010    Vertigo    Past Surgical History:  Procedure Laterality Date   APPENDECTOMY     PACEMAKER PLACEMENT     PPM GENERATOR CHANGEOUT N/A 11/05/2021   Procedure: PPM GENERATOR CHANGEOUT;  Surgeon: Fernande Elspeth BROCKS, MD;  Location: [facility] INVASIVE CV LAB;  Service: Cardiovascular;  Laterality: N/A;   Patient Active Problem List   Diagnosis Date Noted   Acute ischemic stroke (HCC) 02/19/2024   Statin intolerance 11/08/2023   HTN (hypertension) 09/05/2023   Pressure sore of left ischial area, stage I 03/28/2023   NSVT (nonsustained ventricular tachycardia) (HCC) 02/15/2023   Diabetes mellitus without complication (HCC) 11/08/2022   Mixed hyperlipidemia 11/08/2022   BPPV (benign paroxysmal positional vertigo), right 11/08/2022   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   Pacemaker-Medtronic 05/10/2011   OVERWEIGHT 03/30/2009   Atrioventricular block, 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    ONSET DATE: 02/19/24  REFERRING DIAG: I63.9  (ICD-10-CM) - CVA (cerebral vascular accident) (HCC)  THERAPY DIAG:  Other abnormalities of gait and mobility  Muscle weakness (generalized)  Rationale for Evaluation and Treatment: Rehabilitation  SUBJECTIVE:                                                                                                                                                                                             SUBJECTIVE STATEMENT: Present Illness 83 yo male presents [facility] ED on 02/19/2024 with sudden onset of difficulty with words and confusion. TNK administered 7/21. Pt reports he is back to normal. Pt  reports he still has slight intermittent tingling on the R side of his face. Pt reports that he was switched from Plavix  to Eliquist and he has to pay $500 for Eliquist which is lot of money for him. Pt educated to discuss this with his cardiologist, PCP and/or [facility]. Pt has pacemaker for 14-15 year. Pt reports he takes BP every morning and it is usually around systolic around 110/high 60s for diastolic. Pt accompanied by: self  PERTINENT HISTORY: PMH: complete AV block s/p pacemaker, DM, HTN, HLD.  PAIN:  Are you having pain? No  PRECAUTIONS: Fall  RED FLAGS: None   WEIGHT BEARING RESTRICTIONS: No  FALLS: Has patient fallen in last 6 months? No  LIVING ENVIRONMENT: Lives with: lives with their spouse Lives in: House/apartment Stairs: Yes: External: 3 steps; on right going up Has following equipment at home: None  PLOF: Independent  PATIENT GOALS: none  OBJECTIVE:  Note: Objective measures were completed at Evaluation unless otherwise noted.  DIAGNOSTIC FINDINGS: IMPRESSION: 02/21/24 MRI brain 1. Age-related atrophy and a mild to moderate nonspecific cerebral white matter disease. No apparent acute process.  COGNITION: Overall cognitive status: Within functional limits for tasks assessed   S LOWER EXTREMITY ROM:     Active  Right Eval Left Eval  Hip flexion    Hip  extension    Hip abduction    Hip adduction    Hip internal rotation    Hip external rotation    Knee flexion    Knee extension    Ankle dorsiflexion    Ankle plantarflexion    Ankle inversion    Ankle eversion     (Blank rows = not tested)  LOWER EXTREMITY MMT:    MMT Right Eval Left Eval  Hip flexion 5 5  Hip extension    Hip abduction 5 5  Hip adduction 5 5  Hip internal rotation    Hip external rotation    Knee flexion 5 5  Knee extension 5 5  Ankle dorsiflexion 5 5  Ankle plantarflexion    Ankle inversion    Ankle eversion    (Blank rows = not tested)  BED MOBILITY:  Not tested  TRANSFERS: Sit to stand: Complete Independence  Assistive device utilized: None      RAMP:  Not tested  CURB:  Not tested  STAIRS: Findings: Number of Stairs: 8, Height of Stairs: 6   , and Comments: used no rail bil GAIT: Findings: Gait Characteristics: foot clearance can be improved and Distance walked: 230'  FUNCTIONAL TESTS:  10 meter walk test: 10.55 sec (0.95 m/s) Functional gait assessment:  FUNCTIONAL GAIT ASSESSMENT  Date: 03/01/24 Score  GAIT LEVEL SURFACE Instructions: Walk at your normal speed from here to the next mark (6 m) [20 ft]. (3) Normal - Walks 6 m (20 ft) in less than 5.5 seconds, no assistive devices, good speed, no evidence for imbalance, normal gait pattern, deviates no more than 15.24 cm (6 in) outside of the 30.48-cm (12-in) walkway width.  2.   CHANGE IN GAIT SPEED Instructions: Begin walking at your normal pace (for 1.5 m [5 ft]). When I tell you "go," walk as fast as you can (for 1.5 m [5 ft]). When I tell you "slow," walk as slowly as you can (for 1.5 m [5 ft]. (3) Normal - Able to smoothly change walking speed without loss of balance or gait deviation. Shows a significant difference in walking speeds between normal, fast, and slow speeds.  Deviates no more than 15.24 cm (6 in) outside of the 30.48-cm (12-in) walkway width.  3.    GAIT WITH HORIZONTAL  HEAD TURNS Instructions: Walk from here to the next mark 6 m (20 ft) away. Begin walking at your normal pace. Keep walking straight; after 3 steps, turn your head to the right and keep walking straight while looking to the right. After 3 more steps, turn your head to the left and keep walking straight while looking left. Continue alternating looking right and left. (3) Normal - Performs head turns smoothly with no change in gait. Deviates no more than 15.24 cm (6 in) outside 30.48-cm (12-in) walkway width.  4.   GAIT WITH VERTICAL HEAD TURNS Instructions: Walk from here to the next mark (6 m [20 ft]). Begin walking at your normal pace. Keep walking straight; after 3 steps, tip your head up and keep walking straight while looking up. After 3 more steps, tip your head down, keep walking straight while looking down. Continue  alternating looking up and down every 3 steps until you have completed 2 repetitions in each direction. (3) Normal - Performs head turns with no change in gait. Deviates no more than 15.24 cm (6 in) outside 30.48-cm (12-in) walkway width.  5.  GAIT AND PIVOT TURN Instructions: Begin with walking at your normal pace. When I tell you, "turn and stop," turn as quickly as you can to face the opposite direction and stop. (3) Normal - Pivot turns safely within 3 seconds and stops quickly with no loss of balance  6.   STEP OVER OBSTACLE Instructions: Begin walking at your normal speed. When you come to the shoe box, step over it, not around it, and keep walking. (3) Normal - Is able to step over 2 stacked shoe boxes taped together (22.86 cm [9 in] total height) without changing gait speed; no evidence of imbalance.  7.   GAIT WITH NARROW BASE OF SUPPORT Instructions: Walk on the floor with arms folded across the chest, feet aligned heel to toe in tandem for a distance of 3.6 m [12 ft]. The number of steps taken in a straight line are counted for a maximum of 10 steps. (3) Normal - Is able to  ambulate for 10 steps heel to toe with no staggering.  8.   GAIT WITH EYES CLOSED Instructions: Walk at your normal speed from here to the next mark (6 m [20 ft]) with your eyes closed. (3) Normal - Walks 6 m (20 ft), no assistive devices, good speed, no evidence of imbalance, normal gait pattern, deviates no more than 15.24 cm (6 in) outside 30.48-cm (12-in) walkway width. Ambulates 6 m (20 ft) in less than 7 seconds.  9.   AMBULATING BACKWARDS Instructions: Walk backwards until I tell you to stop (3) Normal - Walks 6 m (20 ft), no assistive devices, good speed, no evidence for imbalance, normal gait pattern, deviates no more than 15.24 cm (6 in) outside 30.48-cm (12-in) walkway width.  10. STEPS Instructions: Walk up these stairs as you would at home (ie, using the rail if necessary). At the top turn around and walk down. (3) Normal-Alternating feet, no rail.  Total 30/30   Interpretation of scores: Non-Specific Older Adults Cutoff Score: <=22/30 = risk of falls Parkinson's Disease Cutoff score <15/30= fall risk (Hoehn & Yahr 1-4)  Minimally Clinically Important Difference (MCID)  Stroke (acute, subacute, and chronic) = MDC: 4.2 points Vestibular (acute) = MDC: 6 points [employer] Older  Adults =  MCID: 4 points Parkinson's Disease  =  MDC: 4.3 points  (Academy of Neurologic Physical Therapy (nd). Functional Gait Assessment. Retrieved from https://www.neuropt.org/docs/default-source/cpgs/core-outcome-measures/function-gait-assessment-pocket-guide-proof9-(2).pdf?sfvrsn=b57f35043_0.)                                                                                                                               TREATMENT DATE:  Standing gastroc stretch: 3 x 30  Discussed importance of continuous monitoring of BP Pt educated on normal BP readings as he didn't accurate report on what normal systolic and diastolic numbers should be.   PATIENT EDUCATION: Education details: see  above Person educated: Patient Education method: Medical illustrator Education comprehension: verbalized understanding  HOME EXERCISE PROGRAM: Access Code: [email or web address] URL: [email or web address] Date: 03/01/2024 Prepared by: Raj Blanch  Exercises - Standing Bilateral Gastrocnemius Stretch at Counter  - 1 x daily - 7 x weekly - 3 reps - 30 sec hold  GOALS: No goals established, one time visit only  ASSESSMENT:  CLINICAL IMPRESSION: Patient is a 83 y.o. male who was seen today for physical therapy evaluation after recent stroke. Patient reports that he has returned back to baseline after his recent stroke and currently has no physical or cognitive impairments. Patient currently is not using any AD for ambulation or mobility. Objective impairments include WFL of gait speed and normal functional mobility based on Functional Gait Assessment score. Patient does not need skilled PT at this point. Pt was given calf stretching as foot clearance could be improved during gait bil.  OBJECTIVE IMPAIRMENTS: difficulty walking and impaired vision/preception.   ACTIVITY LIMITATIONS: none  PARTICIPATION LIMITATIONS: none  PERSONAL FACTORS: Age, Time since onset of injury/illness/exacerbation, and 1-2 comorbidities: Complete AV block, pacemaker, DM, HTN, HLD are also affecting patient's functional outcome.   REHAB POTENTIAL: Excellent  CLINICAL DECISION MAKING: Stable/uncomplicated  EVALUATION COMPLEXITY: Low  PLAN: One time visit only  Raj LOISE Blanch, PT 03/01/2024, 10:03 AM

## 2024-03-01 ENCOUNTER — Ambulatory Visit: Attending: Nurse Practitioner

## 2024-03-01 ENCOUNTER — Other Ambulatory Visit: Payer: Self-pay

## 2024-03-01 DIAGNOSIS — R2689 Other abnormalities of gait and mobility: Secondary | ICD-10-CM | POA: Insufficient documentation

## 2024-03-01 DIAGNOSIS — M6281 Muscle weakness (generalized): Secondary | ICD-10-CM | POA: Diagnosis not present

## 2024-03-03 ENCOUNTER — Ambulatory Visit: Payer: Self-pay | Admitting: Cardiology

## 2024-03-04 ENCOUNTER — Encounter: Payer: Self-pay | Admitting: Family Medicine

## 2024-03-04 ENCOUNTER — Ambulatory Visit (INDEPENDENT_AMBULATORY_CARE_PROVIDER_SITE_OTHER): Admitting: Family Medicine

## 2024-03-04 VITALS — BP 140/80 | HR 70 | Temp 98.1°F | Ht 72.0 in | Wt 181.5 lb

## 2024-03-04 DIAGNOSIS — I639 Cerebral infarction, unspecified: Secondary | ICD-10-CM

## 2024-03-04 DIAGNOSIS — I10 Essential (primary) hypertension: Secondary | ICD-10-CM

## 2024-03-04 NOTE — ED Provider Notes (Signed)
 [facility] Provider Note   CSN: [id number] Arrival date & time: 02/19/24  2008     Patient presents with: Code Stroke   Nathan [name] is a 83 y.o. male.   Pt presents via EMS as code cva activation. Reportedly last seen at baseline at ~ 530. Pt noted to be confused and have aphasia. Neurology at bridge evaluating patient. Pt limited historian - level 5 caveat.   The history is provided by the patient, the EMS personnel and medical records. The history is limited by the condition of the patient.      Allergies: Actos [pioglitazone], Benicar [olmesartan], Diovan [valsartan], Flexeril [cyclobenzaprine], Niacin and related, Niaspan [niacin], and Sulfonamide derivatives    Review of Systems  Constitutional:  Negative for fever.  Respiratory:  Negative for shortness of breath.   Neurological:  Positive for speech difficulty. Negative for headaches.  Psychiatric/Behavioral:  Positive for confusion.     Updated Vital Signs BP (!) 137/93 (BP Location: Right Arm)   Pulse 75   Temp 98.6 F (37 C) (Oral)   Resp 19   Ht 1.829 m (6')   Wt 79.5 kg   SpO2 97%   BMI 23.77 kg/m   Physical Exam Vitals and nursing note reviewed.  Constitutional:      Appearance: Normal appearance. He is well-developed.  HENT:     Head: Atraumatic.     Nose: Nose normal.     Mouth/Throat:     Mouth: Mucous membranes are moist.     Pharynx: Oropharynx is clear.  Eyes:     General: No scleral icterus.    Conjunctiva/sclera: Conjunctivae normal.     Pupils: Pupils are equal, round, and reactive to light.  Neck:     Vascular: No carotid bruit.     Trachea: No tracheal deviation.  Cardiovascular:     Rate and Rhythm: Normal rate and regular rhythm.     Pulses: Normal pulses.     Heart sounds: Normal heart sounds. No murmur heard.    No friction rub. No gallop.  Pulmonary:     Effort: Pulmonary effort is normal. No accessory muscle usage or respiratory distress.     Breath  sounds: Normal breath sounds.  Abdominal:     General: There is no distension.     Palpations: Abdomen is soft.     Tenderness: There is no abdominal tenderness.  Genitourinary:    Comments: No cva tenderness. Musculoskeletal:        General: No swelling.     Cervical back: Normal range of motion and neck supple. No rigidity.  Skin:    General: Skin is warm and dry.     Findings: No rash.  Neurological:     Mental Status: He is alert.     Comments: Alert, speech dysarthric/exp aphasia.      (all labs ordered are listed, but only abnormal results are displayed) Results for orders placed or performed during the hospital encounter of 02/19/24  CBG monitoring, ED   Collection Time: 02/19/24  8:12 PM  Result Value Ref Range   Glucose-Capillary 143 (H) 70 - 99 mg/dL  I-stat chem 8, ED   Collection Time: 02/19/24  8:16 PM  Result Value Ref Range   Sodium 131 (L) 135 - 145 mmol/L   Potassium 4.6 3.5 - 5.1 mmol/L   Chloride 98 98 - 111 mmol/L   BUN 11 8 - 23 mg/dL   Creatinine, Ser 9.29 0.61 - 1.24 mg/dL  Glucose, Bld 127 (H) 70 - 99 mg/dL   Calcium , Ion 1.12 (L) 1.15 - 1.40 mmol/L   TCO2 23 22 - 32 mmol/L   Hemoglobin 15.3 13.0 - 17.0 g/dL   HCT 54.9 60.9 - 47.9 %  Ethanol   Collection Time: 02/19/24  8:59 PM  Result Value Ref Range   Alcohol, Ethyl (B) <15 <15 mg/dL  Protime-INR   Collection Time: 02/19/24  8:59 PM  Result Value Ref Range   Prothrombin Time 14.7 11.4 - 15.2 seconds   INR 1.1 0.8 - 1.2  APTT   Collection Time: 02/19/24  8:59 PM  Result Value Ref Range   aPTT 31 24 - 36 seconds  CBC   Collection Time: 02/19/24  8:59 PM  Result Value Ref Range   WBC 15.0 (H) 4.0 - 10.5 K/uL   RBC 4.93 4.22 - 5.81 MIL/uL   Hemoglobin 15.2 13.0 - 17.0 g/dL   HCT 56.4 60.9 - 47.9 %   MCV 88.2 80.0 - 100.0 fL   MCH 30.8 26.0 - 34.0 pg   MCHC 34.9 30.0 - 36.0 g/dL   RDW 87.4 88.4 - 84.4 %   Platelets 235 150 - 400 K/uL   nRBC 0.0 0.0 - 0.2 %  Differential    Collection Time: 02/19/24  8:59 PM  Result Value Ref Range   Neutrophils Relative % 82 %   Neutro Abs 12.2 (H) 1.7 - 7.7 K/uL   Lymphocytes Relative 9 %   Lymphs Abs 1.4 0.7 - 4.0 K/uL   Monocytes Relative 9 %   Monocytes Absolute 1.3 (H) 0.1 - 1.0 K/uL   Eosinophils Relative 0 %   Eosinophils Absolute 0.1 0.0 - 0.5 K/uL   Basophils Relative 0 %   Basophils Absolute 0.1 0.0 - 0.1 K/uL   Immature Granulocytes 0 %   Abs Immature Granulocytes 0.06 0.00 - 0.07 K/uL  Comprehensive metabolic panel   Collection Time: 02/19/24  8:59 PM  Result Value Ref Range   Sodium 129 (L) 135 - 145 mmol/L   Potassium 4.8 3.5 - 5.1 mmol/L   Chloride 97 (L) 98 - 111 mmol/L   CO2 20 (L) 22 - 32 mmol/L   Glucose, Bld 157 (H) 70 - 99 mg/dL   BUN 9 8 - 23 mg/dL   Creatinine, Ser 9.29 0.61 - 1.24 mg/dL   Calcium  9.1 8.9 - 10.3 mg/dL   Total Protein 6.7 6.5 - 8.1 g/dL   Albumin 3.8 3.5 - 5.0 g/dL   AST 17 15 - 41 U/L   ALT 16 0 - 44 U/L   Alkaline Phosphatase 89 38 - 126 U/L   Total Bilirubin 1.1 0.0 - 1.2 mg/dL   GFR, Estimated >39 >39 mL/min   Anion gap 12 5 - 15  Urine rapid drug screen (hosp performed)   Collection Time: 02/19/24 10:48 PM  Result Value Ref Range   Opiates NONE DETECTED NONE DETECTED   Cocaine NONE DETECTED NONE DETECTED   Benzodiazepines NONE DETECTED NONE DETECTED   Amphetamines NONE DETECTED NONE DETECTED   Tetrahydrocannabinol NONE DETECTED NONE DETECTED   Barbiturates NONE DETECTED NONE DETECTED  Urinalysis, Routine w reflex microscopic -Urine, Catheterized   Collection Time: 02/19/24 10:48 PM  Result Value Ref Range   Color, Urine STRAW (A) YELLOW   APPearance CLEAR CLEAR   Specific Gravity, Urine 1.017 1.005 - 1.030   pH 8.0 5.0 - 8.0   Glucose, UA 50 (A) NEGATIVE mg/dL   Hgb urine dipstick  NEGATIVE NEGATIVE   Bilirubin Urine NEGATIVE NEGATIVE   Ketones, ur 20 (A) NEGATIVE mg/dL   Protein, ur NEGATIVE NEGATIVE mg/dL   Nitrite NEGATIVE NEGATIVE   Leukocytes,Ua  NEGATIVE NEGATIVE  MRSA Next Gen by PCR, Nasal   Collection Time: 02/19/24 10:51 PM   Specimen: Nasal Mucosa; Nasal Swab  Result Value Ref Range   MRSA by PCR Next Gen NOT DETECTED NOT DETECTED  Lipid panel   Collection Time: 02/20/24  6:26 AM  Result Value Ref Range   Cholesterol 170 0 - 200 mg/dL   Triglycerides 79 <849 mg/dL   HDL 36 (L) >59 mg/dL   Total CHOL/HDL Ratio 4.7 RATIO   VLDL 16 0 - 40 mg/dL   LDL Cholesterol 881 (H) 0 - 99 mg/dL  ECHOCARDIOGRAM COMPLETE   Collection Time: 02/20/24  9:11 AM  Result Value Ref Range   Weight 2,804.25 oz   Height 72 in   BP 110/66 mmHg   S' Lateral 2.70 cm   Area-P 1/2 1.81 cm2   MV VTI 2.05 cm2   Est EF 60 - 65%   Glucose, capillary   Collection Time: 02/20/24  5:10 PM  Result Value Ref Range   Glucose-Capillary 177 (H) 70 - 99 mg/dL  Glucose, capillary   Collection Time: 02/20/24  9:56 PM  Result Value Ref Range   Glucose-Capillary 183 (H) 70 - 99 mg/dL  Hemoglobin J8r   Collection Time: 02/21/24  6:06 AM  Result Value Ref Range   Hgb A1c MFr Bld 6.3 (H) 4.8 - 5.6 %   Mean Plasma Glucose 134.11 mg/dL  Basic metabolic panel with GFR   Collection Time: 02/21/24  6:06 AM  Result Value Ref Range   Sodium 132 (L) 135 - 145 mmol/L   Potassium 3.9 3.5 - 5.1 mmol/L   Chloride 100 98 - 111 mmol/L   CO2 21 (L) 22 - 32 mmol/L   Glucose, Bld 137 (H) 70 - 99 mg/dL   BUN 12 8 - 23 mg/dL   Creatinine, Ser 9.19 0.61 - 1.24 mg/dL   Calcium  8.7 (L) 8.9 - 10.3 mg/dL   GFR, Estimated >39 >39 mL/min   Anion gap 11 5 - 15  CBC   Collection Time: 02/21/24  6:06 AM  Result Value Ref Range   WBC 9.7 4.0 - 10.5 K/uL   RBC 4.14 (L) 4.22 - 5.81 MIL/uL   Hemoglobin 13.0 13.0 - 17.0 g/dL   HCT 63.1 (L) 60.9 - 47.9 %   MCV 88.9 80.0 - 100.0 fL   MCH 31.4 26.0 - 34.0 pg   MCHC 35.3 30.0 - 36.0 g/dL   RDW 87.0 88.4 - 84.4 %   Platelets 198 150 - 400 K/uL   nRBC 0.0 0.0 - 0.2 %  Glucose, capillary   Collection Time: 02/21/24  7:40 AM   Result Value Ref Range   Glucose-Capillary 120 (H) 70 - 99 mg/dL  Glucose, capillary   Collection Time: 02/21/24 11:50 AM  Result Value Ref Range   Glucose-Capillary 153 (H) 70 - 99 mg/dL  Glucose, capillary   Collection Time: 02/21/24  4:28 PM  Result Value Ref Range   Glucose-Capillary 208 (H) 70 - 99 mg/dL  Glucose, capillary   Collection Time: 02/21/24 10:15 PM  Result Value Ref Range   Glucose-Capillary 155 (H) 70 - 99 mg/dL   Comment 1 Notify RN   Basic metabolic panel with GFR   Collection Time: 02/22/24  3:33 AM  Result Value Ref  Range   Sodium 132 (L) 135 - 145 mmol/L   Potassium 3.7 3.5 - 5.1 mmol/L   Chloride 98 98 - 111 mmol/L   CO2 22 22 - 32 mmol/L   Glucose, Bld 139 (H) 70 - 99 mg/dL   BUN 10 8 - 23 mg/dL   Creatinine, Ser 9.13 0.61 - 1.24 mg/dL   Calcium  8.7 (L) 8.9 - 10.3 mg/dL   GFR, Estimated >39 >39 mL/min   Anion gap 12 5 - 15  CBC   Collection Time: 02/22/24  3:33 AM  Result Value Ref Range   WBC 7.6 4.0 - 10.5 K/uL   RBC 4.26 4.22 - 5.81 MIL/uL   Hemoglobin 13.2 13.0 - 17.0 g/dL   HCT 61.8 (L) 60.9 - 47.9 %   MCV 89.4 80.0 - 100.0 fL   MCH 31.0 26.0 - 34.0 pg   MCHC 34.6 30.0 - 36.0 g/dL   RDW 87.0 88.4 - 84.4 %   Platelets 201 150 - 400 K/uL   nRBC 0.0 0.0 - 0.2 %  Glucose, capillary   Collection Time: 02/22/24  6:29 AM  Result Value Ref Range   Glucose-Capillary 163 (H) 70 - 99 mg/dL   Comment 1 Notify RN    Comment 2 Document in Chart    CUP PACEART INCLINIC DEVICE CHECK Result Date: 02/27/2024 in-clinic _dual chamber pacemaker check. Presenting Rhythm: _AS/VP__ . Routine testing of thresholds, sensing, and impedance demonstrate stable parameters and no programming changes needed at this time. no further AFib. Estimated longevity _9.7 years__ .  Pt enrolled in remote follow-up. symptomatic with LOC  VAS US  CAROTID Result Date: 02/22/2024 Carotid Arterial Duplex Study Patient Name:  NISHAWN ROTAN  Date of Exam:   02/21/2024 Medical Rec  #: [id number]        Accession #:    [id number] Date of Birth: 07/03/1941        Patient Gender: M Patient Age:   72 years Exam Location:  [facility] Procedure:      VAS US  CAROTID Referring Phys: DONNICE SENDER --------------------------------------------------------------------------------  Indications:       CVA, Carotid artery disease and Presented with aphasia and                    confusion. Risk Factors:      Hypertension, hyperlipidemia, Diabetes. Comparison Study:  No prior carotid duplex study                    02/19/24 - CTA showed left bifurcation 40% stenosis. Performing Technologist: Ricka Sturdivant-Jones RDMS, RVT  Examination Guidelines: A complete evaluation includes B-mode imaging, spectral Doppler, color Doppler, and power Doppler as needed of all accessible portions of each vessel. Bilateral testing is considered an integral part of a complete examination. Limited examinations for reoccurring indications may be performed as noted.  Right Carotid Findings: +----------+--------+--------+--------+------------------+--------+           PSV cm/sEDV cm/sStenosisPlaque DescriptionComments +----------+--------+--------+--------+------------------+--------+ CCA Prox  61      15                                         +----------+--------+--------+--------+------------------+--------+ CCA Distal86      17              calcific                   +----------+--------+--------+--------+------------------+--------+  ICA Prox  74      17      1-39%   calcific                   +----------+--------+--------+--------+------------------+--------+ ICA Mid   80      27                                         +----------+--------+--------+--------+------------------+--------+ ICA Distal73      30                                         +----------+--------+--------+--------+------------------+--------+ ECA       96                                                  +----------+--------+--------+--------+------------------+--------+ +----------+--------+-------+----------------+-------------------+           PSV cm/sEDV cmsDescribe        Arm Pressure (mmHG) +----------+--------+-------+----------------+-------------------+ Subclavian80             Multiphasic, WNL                    +----------+--------+-------+----------------+-------------------+ +---------+--------+--+--------+--+---------+ VertebralPSV cm/s41EDV cm/s11Antegrade +---------+--------+--+--------+--+---------+  Left Carotid Findings: +----------+--------+--------+--------+------------------+--------+           PSV cm/sEDV cm/sStenosisPlaque DescriptionComments +----------+--------+--------+--------+------------------+--------+ CCA Prox  75      16                                         +----------+--------+--------+--------+------------------+--------+ CCA Distal67      16              calcific                   +----------+--------+--------+--------+------------------+--------+ ICA Prox  75      26      1-39%                              +----------+--------+--------+--------+------------------+--------+ ICA Mid   88      28                                         +----------+--------+--------+--------+------------------+--------+ ICA Distal83      28                                         +----------+--------+--------+--------+------------------+--------+ ECA       84      8                                          +----------+--------+--------+--------+------------------+--------+ +----------+--------+--------+----------------+-------------------+           PSV cm/sEDV cm/sDescribe        Arm Pressure (  mmHG) +----------+--------+--------+----------------+-------------------+ [id number]              Multiphasic, WNL                    +----------+--------+--------+----------------+-------------------+  +---------+--------+--+--------+--+---------+ VertebralPSV cm/s45EDV cm/s17Antegrade +---------+--------+--+--------+--+---------+   Summary: Right Carotid: Velocities in the right ICA are consistent with a 1-39% stenosis. Left Carotid: Velocities in the left ICA are consistent with a 1-39% stenosis. Vertebrals:  Bilateral vertebral arteries demonstrate antegrade flow. Subclavians: Normal flow hemodynamics were seen in bilateral subclavian              arteries. *See table(s) above for measurements and observations.  Electronically signed by Eather Popp MD on 02/22/2024 at 8:19:30 AM.    Final    MR BRAIN WO CONTRAST Result Date: 02/21/2024 CLINICAL DATA:  Stroke, follow up EXAM: MRI HEAD WITHOUT CONTRAST TECHNIQUE: Multiplanar, multiecho pulse sequences of the brain and surrounding structures were obtained without intravenous contrast. COMPARISON:  CT the head dated February 20, 2024. FINDINGS: Brain: There is age related cerebral volume loss present. There is mild to moderate periventricular and subcortical white matter disease present. There is no restricted diffusion to indicate acute or recent infarction. There is no evidence of hemorrhage, mass or hydrocephalus. Vascular: Normal vascular flow voids. Skull and upper cervical spine: Normal marrow signal. No osseous lesions. Sinuses/Orbits: Clear paranasal sinuses.  Normal orbits. Other: None. IMPRESSION: 1. Age-related atrophy and a mild to moderate nonspecific cerebral white matter disease. No apparent acute process. Electronically Signed   By: Evalene Coho M.D.   On: 02/21/2024 10:51   CT HEAD WO CONTRAST ( ) Result Date: 02/20/2024 EXAM: CT HEAD WITHOUT CONTRAST 02/20/2024 09:50:00 PM TECHNIQUE: CT of the head was performed without the administration of intravenous contrast. Automated exposure control, iterative reconstruction, and/or weight based adjustment of the mA/kV was utilized to reduce the radiation dose to as low as reasonably  achievable. COMPARISON: 02/19/2024 CLINICAL HISTORY: Stroke, follow up. FINDINGS: BRAIN AND VENTRICLES: No acute hemorrhage. Gray-white differentiation is preserved. No hydrocephalus. No extra-axial collection. No mass effect or midline shift. ORBITS: No acute abnormality. SINUSES: No acute abnormality. SOFT TISSUES AND SKULL: No acute soft tissue abnormality. No skull fracture. IMPRESSION: 1. No acute intracranial abnormality. Electronically signed by: Franky Stanford MD 02/20/2024 11:20 PM EDT RP Workstation: [id number]   ECHOCARDIOGRAM COMPLETE Result Date: 02/20/2024    ECHOCARDIOGRAM REPORT   Patient Name:   FREDRICK GEOGHEGAN Date of Exam: 02/20/2024 Medical Rec #:  [id number]       Height:       72.0 in Accession #:    [id number]      Weight:       175.3 lb Date of Birth:  [birth year]       BSA:          2.014 m Patient Age:    82 years        BP:           110/66 mmHg Patient Gender: M               HR:           66 bpm. Exam Location:  Inpatient Procedure: 2D Echo, Color Doppler, Cardiac Doppler and Intracardiac            Opacification Agent (Both Spectral and Color Flow Doppler were            utilized during procedure). Indications:    Stroke i63.9  History:  Patient has prior history of Echocardiogram examinations, most                 recent 03/24/2023. Pacemaker; Risk Factors:Hypertension, Diabetes                 and Dyslipidemia.  Sonographer:    Damien Senior RDCS Referring Phys: [id number] ASHISH ARORA  Sonographer Comments: Technically difficult study limited by patient cooperation. IMPRESSIONS  1. Left ventricular ejection fraction, by estimation, is 60 to 65%. The left ventricle has normal function. The left ventricle has no regional wall motion abnormalities. There is moderate asymmetric left ventricular hypertrophy of the septal segment. Left ventricular diastolic parameters are indeterminate.  2. Right ventricular systolic function is normal. The right ventricular size is normal. There is normal  pulmonary artery systolic pressure. The estimated right ventricular systolic pressure is 29.6 mmHg.  3. Left atrial size was mildly dilated.  4. The mitral valve is degenerative. Trivial mitral valve regurgitation. No evidence of mitral stenosis. The mean mitral valve gradient is 2.0 mmHg with average heart rate of 70 bpm. Severe mitral annular calcification.  5. Unable to get gradients on today's study due to limited patient cooperation; Visually on short axis assessment, valve consistent with moderate aortic stenosis (mean gradient 19 mm Hg last year). The aortic valve is calcified. There is moderate calcification of the aortic valve. Aortic valve regurgitation is not visualized.  6. The inferior vena cava is normal in size with greater than 50% respiratory variability, suggesting right atrial pressure of 3 mmHg. Comparison(s): Prior images reviewed side by side. Similar to prior with caveats on aortic valve assessment as noted above. FINDINGS  Left Ventricle: Left ventricular ejection fraction, by estimation, is 60 to 65%. The left ventricle has normal function. The left ventricle has no regional wall motion abnormalities. Definity  contrast agent was given IV to delineate the left ventricular  endocardial borders. The left ventricular internal cavity size was normal in size. There is moderate asymmetric left ventricular hypertrophy of the septal segment. Left ventricular diastolic parameters are indeterminate. Right Ventricle: The right ventricular size is normal. No increase in right ventricular wall thickness. Right ventricular systolic function is normal. There is normal pulmonary artery systolic pressure. The tricuspid regurgitant velocity is 2.58 m/s, and  with an assumed right atrial pressure of 3 mmHg, the estimated right ventricular systolic pressure is 29.6 mmHg. Left Atrium: Left atrial size was mildly dilated. Right Atrium: Right atrial size was not well visualized. Pericardium: There is no evidence of  pericardial effusion. Presence of epicardial fat layer. Mitral Valve: The mitral valve is degenerative in appearance. Severe mitral annular calcification. Trivial mitral valve regurgitation. No evidence of mitral valve stenosis. MV peak gradient, 6.1 mmHg. The mean mitral valve gradient is 2.0 mmHg with average heart rate of 70 bpm. Tricuspid Valve: The tricuspid valve is normal in structure. Tricuspid valve regurgitation is mild. Aortic Valve: Unable to get gradients on today's study due to limited patient cooperation; Visually on short axis assessment, valve consistent with moderate aortic stenosis (mean gradient 19 mm Hg last year). The aortic valve is calcified. There is moderate calcification of the aortic valve. Aortic valve regurgitation is not visualized. Pulmonic Valve: The pulmonic valve was normal in structure. Pulmonic valve regurgitation is trivial. Aorta: The aortic root is normal in size and structure and the ascending aorta was not well visualized. Venous: The inferior vena cava is normal in size with greater than 50% respiratory variability, suggesting right atrial pressure of 3  mmHg. IAS/Shunts: No atrial level shunt detected by color flow Doppler. Additional Comments: A device lead is visualized in the right ventricle and right atrium.  LEFT VENTRICLE PLAX 2D LVIDd:         3.70 cm   Diastology LVIDs:         2.70 cm   LV e' medial:    6.06 cm/s LV PW:         0.90 cm   LV E/e' medial:  12.7 LV IVS:        1.40 cm   LV e' lateral:   5.28 cm/s LVOT diam:     2.00 cm   LV E/e' lateral: 14.6 LV SV:         77 LV SV Index:   38 LVOT Area:     3.14 cm  RIGHT VENTRICLE RV S prime:     14.00 cm/s TAPSE (M-mode): 2.0 cm LEFT ATRIUM             Index LA diam:        3.40 cm 1.69 cm/m LA Vol (A2C):   71.2 ml 35.35 ml/m LA Vol (A4C):   75.5 ml 37.48 ml/m LA Biplane Vol: 74.3 ml 36.89 ml/m  AORTIC VALVE LVOT Vmax:   109.00 cm/s LVOT Vmean:  89.900 cm/s LVOT VTI:    0.245 m  AORTA Ao Root diam: 3.30 cm  MITRAL VALVE                TRICUSPID VALVE MV Area (PHT): 1.81 cm     TR Peak grad:   26.6 mmHg MV Area VTI:   2.05 cm     TR Vmax:        258.00 cm/s MV Peak grad:  6.1 mmHg MV Mean grad:  2.0 mmHg     SHUNTS MV Vmax:       1.23 m/s     Systemic VTI:  0.24 m MV Vmean:      65.2 cm/s    Systemic Diam: 2.00 cm MV Decel Time: 419 msec MV E velocity: 77.10 cm/s MV A velocity: 129.00 cm/s MV E/A ratio:  0.60 Stanly Leavens MD Electronically signed by Stanly Leavens MD Signature Date/Time: 02/20/2024/9:57:11 AM    Final    DG Chest 1 View Result Date: 02/20/2024 CLINICAL DATA:  [id number] Aspiration into airway [id number] EXAM: CHEST  1 VIEW COMPARISON:  CT chest 08/26/2008, CT angio chest 02/19/2024 FINDINGS: Left chest wall dual lead pacemaker. The heart and mediastinal contours are unchanged. Atherosclerotic plaque. No focal consolidation. Question nodular interstitial markings within the right lower lobe and left upper lobe. No pleural effusion. No pneumothorax. No acute osseous abnormality. IMPRESSION: 1. Question nodular interstitial markings within the right lower lobe and left upper lobe. Finding may be infectious versus inflammatory. Followup PA and lateral chest X-ray is recommended in 3-4 weeks following therapy to ensure resolution. 2.  Aortic Atherosclerosis (ICD10-I70.0). Electronically Signed   By: Morgane  Naveau M.D.   On: 02/20/2024 00:20   CT ANGIO HEAD NECK W WO CM (CODE STROKE) Result Date: 02/19/2024 CLINICAL DATA:  Neuro deficit, concern for stroke, aphasia, vision loss. EXAM: CT ANGIOGRAPHY HEAD AND NECK WITH AND WITHOUT CONTRAST TECHNIQUE: Multidetector CT imaging of the head and neck was performed using the standard protocol during bolus administration of intravenous contrast. Multiplanar CT image reconstructions and MIPs were obtained to evaluate the vascular anatomy. Carotid stenosis measurements (when applicable) are obtained utilizing NASCET criteria, using the distal internal  carotid diameter as the denominator. RADIATION DOSE REDUCTION: This exam was performed according to the departmental dose-optimization program which includes automated exposure control, adjustment of the mA and/or kV according to patient size and/or use of iterative reconstruction technique. CONTRAST:  OMNIPAQUE  IOHEXOL  350 MG/ML SOLN COMPARISON:  Same-day head CT. FINDINGS: CTA NECK FINDINGS Aortic arch: Standard configuration of the aortic arch. Imaged portion shows no evidence of aneurysm or dissection. Atherosclerosis of the partially visualized aortic arch. No significant stenosis of the major arch vessel origins. Pulmonary arteries: Not well visualized. Subclavian arteries: Patent bilaterally. Circumferential calcified atherosclerosis at the origin of the left subclavian artery without significant stenosis. Right carotid system: Patent from the origin to the skull base. Mild atherosclerosis of the common carotid artery. Calcified atherosclerosis at the carotid bifurcation without hemodynamically significant stenosis. No evidence of dissection. Left carotid system: Patent from the origin to the skull base. Mild atherosclerosis along the distal common carotid artery. There is bulky calcified atherosclerosis at the carotid bifurcation resulting in approximately 40% stenosis at the origin of the cervical ICA. No evidence of dissection. Vertebral arteries: Codominant. No evidence of dissection, stenosis (50% or greater), or occlusion. Skeleton: No acute findings. Degenerative changes in the cervical spine. Other neck: The visualized airway is patent. No cervical lymphadenopathy. Upper chest: The visualized lung apices are unremarkable. Partially visualized left chest wall pacer device. Review of the MIP images confirms the above findings CTA HEAD FINDINGS ANTERIOR CIRCULATION: The intracranial ICAs are patent bilaterally. Atherosclerosis of the carotid siphons. Mild stenosis of the bilateral cavernous ICAs  and left paraclinoid ICA. No significant stenosis, proximal occlusion, aneurysm, or vascular malformation. MCAs: Patent bilaterally. Diminutive appearance of M2 superior division branches of the left MCA without occlusion. Additional somewhat diminutive M2 inferior division branches of the left MCA. ACAs: The anterior cerebral arteries are patent bilaterally. POSTERIOR CIRCULATION: No significant stenosis, proximal occlusion, aneurysm, or vascular malformation. PCAs: The posterior cerebral arteries are patent bilaterally. Pcomm: Not well visualized. SCAs: The superior cerebellar arteries are patent bilaterally. Basilar artery: Patent AICAs: Patent PICAs: Patent Vertebral arteries: The intracranial vertebral arteries are patent. Venous sinuses: As permitted by contrast timing, patent. Anatomic variants: None Review of the MIP images confirms the above findings IMPRESSION: No large vessel occlusion. Diminutive appearance of multiple M2 branches of the left MCA without occlusion. Atherosclerosis of the carotid siphons resulting in mild stenosis. Atherosclerosis at the left carotid bifurcation resulting in approximately 40% stenosis. Aortic Atherosclerosis (ICD10-I70.0). These results were called by telephone at the time of interpretation on 02/19/2024 at 8:48 pm to provider [facility] , who verbally acknowledged these results. Electronically Signed   By: Donnice Mania M.D.   On: 02/19/2024 21:04   CT HEAD CODE STROKE WO CONTRAST Result Date: 02/19/2024 CLINICAL DATA:  Code stroke. Neuro deficit, concern for stroke, aphasia, vision problems. EXAM: CT HEAD WITHOUT CONTRAST TECHNIQUE: Contiguous axial images were obtained from the base of the skull through the vertex without intravenous contrast. RADIATION DOSE REDUCTION: This exam was performed according to the departmental dose-optimization program which includes automated exposure control, adjustment of the mA and/or kV according to patient size and/or use of  iterative reconstruction technique. COMPARISON:  CT head 08/03/2021. FINDINGS: Brain: No acute intracranial hemorrhage. No CT evidence of acute infarct. Nonspecific hypoattenuation in the periventricular and subcortical white matter favored to reflect chronic microvascular ischemic changes. No edema, mass effect, or midline shift. The basilar cisterns are patent. Ventricles: The ventricles are normal. Vascular: Atherosclerotic calcifications of the  carotid siphons. No hyperdense vessel. Skull: No acute or aggressive finding. Orbits: Orbits are symmetric. Sinuses: The visualized paranasal sinuses are clear. Other: Mastoid air cells are clear. ASPECTS [facility] Stroke Program Early CT Score) - Ganglionic level infarction (caudate, lentiform nuclei, internal capsule, insula, M1-M3 cortex): 7 - Supraganglionic infarction (M4-M6 cortex): 3 Total score (0-10 with 10 being normal): 10 IMPRESSION: 1. No CT evidence of acute intracranial abnormality. 2. Chronic microvascular ischemic changes. 3. ASPECTS is 10 These results were communicated to Dr. Arora at 8:47 pm on 02/19/2024 by text page via the [facility] messaging system. Electronically Signed   By: Donnice Mania M.D.   On: 02/19/2024 20:47   CUP PACEART REMOTE DEVICE CHECK Result Date: 02/15/2024 Pacemaker: Scheduled remote reviewed. Normal device function.  Presenting rhythm: AP/VP, AS/VP Next remote 91 days. ML, CVRS    EKG: EKG Interpretation Date/Time:  Monday February 19 2024 21:25:15 EDT Ventricular Rate:  86 PR Interval:  221 QRS Duration:  130 QT Interval:  394 QTC Calculation: 472 R Axis:   -12  Text Interpretation: Normal sinus rhythm Multiform ventricular premature complexes Prolonged PR interval Left bundle branch block Confirmed by Cleotilde Rogue ([id number]) on 02/20/2024 10:56:10 AM  Radiology: No results found.   Procedures   Medications Ordered in the ED  perflutren  lipid microspheres (DEFINITY ) IV suspension (5 mLs Intravenous Given 02/20/24  0912)  tenecteplase  (TNKASE ) injection for Stroke 20 mg (20 mg Intravenous Given 02/19/24 2037)  labetalol  (NORMODYNE ) injection 10 mg (10 mg Intravenous Given 02/19/24 2026)   stroke: early stages of recovery book ( Does not apply Given 02/20/24 0922)  iohexol  (OMNIPAQUE ) 350 MG/ML injection 100 mL (100 mLs Intravenous Contrast Given 02/19/24 2046)  tamsulosin  (FLOMAX ) capsule 0.4 mg (0.4 mg Oral Given 02/22/24 9147)                                    Medical Decision Making Amount and/or Complexity of Data Reviewed Labs: ordered. Radiology: ordered.  Risk Decision regarding hospitalization.   Iv ns. Continuous pulse ox and cardiac monitoring. Labs ordered/sent. Imaging ordered.   Differential diagnosis includes cva, ich, etc. Dispo decision including potential need for admission considered - will get labs and imaging and reassess.   Reviewed nursing notes and prior charts for additional history. External reports reviewed. Additional history from: EMS  Cardiac monitor: sinus rhythm, rate 70.  Labs reviewed/interpreted by me - glucose not low. K normal. Hgb normal.   CT reviewed/interpreted by me -  no hem.  Neurology obtaining additional advanced imaging, and elected to give TNK emergently.   Admit to neuro icu.   CRITICAL CARE RE: acute cva, TNK administration Performed by: Grizelda Piscopo E Aariya Ferrick Total critical care time: 40 minutes Critical care time was exclusive of separately billable procedures and treating other patients. Critical care was necessary to treat or prevent imminent or life-threatening deterioration. Critical care was time spent personally by me on the following activities: development of treatment plan with patient and/or surrogate as well as nursing, discussions with consultants, evaluation of patient's response to treatment, examination of patient, obtaining history from patient or surrogate, ordering and performing treatments and interventions, ordering and review  of laboratory studies, ordering and review of radiographic studies, pulse oximetry and re-evaluation of patient's condition.       Final diagnoses:  None    ED Discharge Orders          Ordered  atorvastatin  (LIPITOR) 40 MG tablet  Daily        02/22/24 1312    clopidogrel  (PLAVIX ) 75 MG tablet  Daily,   Status:  Discontinued        02/22/24 1312    aspirin  81 MG chewable tablet  Daily,   Status:  Discontinued        02/22/24 1312    Increase activity slowly        02/22/24 1312    Diet - low sodium heart healthy        02/22/24 1312               Bernard Drivers, MD 03/04/24 1538

## 2024-03-04 NOTE — Progress Notes (Unsigned)
 Established Patient Office Visit  Subjective   Patient ID: Nathan [name], male    DOB: 12/14/1940  Age: 83 y.o. MRN: [id number]  Chief Complaint  Patient presents with   Hospitalization Follow-up    Pt is here for hospital follow up. He was admittd on 7/21 due to acute aphasia, left visual field deficit, code stroke was called and he was given TPA and his symptoms rapidly resolved. States that he was released. He has been tracking it at home daily and he is getting normal readings at home, highest was this morning at 135. Today in office 140/80. He reports his symptoms have completely resolved. I reviewed his medications and he only takes the carvedilol  at night, in the morning he holds the carvedilol  if his systolic BP is less than 110.   I extensively reviewed his medications, imaging studies, consultant notes, etc. States that the only sypmtom he is having is a little bit tingling on the right side of his face.   Patient had questions about driving after the stroke. There was no seizure activity seen in the hospital. He reports that his wife has been driving him to appts and he reports he feels confident that he can drive     Current Outpatient Medications  Medication Instructions   Accu-Chek FastClix Lancets MISC Use as directed four times a day   apixaban  (ELIQUIS ) 5 mg, Oral, 2 times daily   atorvastatin  (LIPITOR) 40 mg, Oral, Daily   carvedilol  (COREG ) 12.5 MG tablet TAKE 1 TABLET BY MOUTH 2 TIMES A DAY TAKE MORNING DOSE IF SYSTOLIC BLOOD PRESSURE IS 110 OR HIGHER   ezetimibe  (ZETIA ) 10 mg, Oral, Daily   lisinopril  (ZESTRIL ) 5 mg, Oral, Daily   MAGNESIUM PO 1 tablet, Daily   metFORMIN  (GLUCOPHAGE ) 1,000 mg, Oral, 2 times daily with meals   nateglinide  (STARLIX ) 120 mg, Oral, 3 times daily with meals   triamcinolone  cream (KENALOG ) 0.1 % 1 Application, Topical, 2 times daily PRN   VITAMIN D, CHOLECALCIFEROL, PO 1 tablet, Daily    Patient Active Problem List   Diagnosis  Date Noted   Acute ischemic stroke (HCC) 02/19/2024   Statin intolerance 11/08/2023   HTN (hypertension) 09/05/2023   Pressure sore of left ischial area, stage I 03/28/2023   NSVT (nonsustained ventricular tachycardia) (HCC) 02/15/2023   Diabetes mellitus without complication (HCC) 11/08/2022   Mixed hyperlipidemia 11/08/2022   BPPV (benign paroxysmal positional vertigo), right 11/08/2022   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   Pacemaker-[employer] 05/10/2011   OVERWEIGHT 03/30/2009   Atrioventricular block, 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     Objective:     BP (!) 140/80   Pulse 70   Temp 98.1 F (36.7 C) (Oral)   Ht 6' (1.829 m)   Wt 181 lb 8 oz (82.3 kg)   SpO2 97%   BMI 24.62 kg/m    Physical Exam Vitals reviewed.  Constitutional:      Appearance: Normal appearance. He is well-groomed and normal weight.  Eyes:     Extraocular Movements: Extraocular movements intact.     Conjunctiva/sclera: Conjunctivae normal.  Neck:     Thyroid : No thyromegaly.  Cardiovascular:     Rate and Rhythm: Normal rate and regular rhythm.     Heart sounds: S1 normal and S2 normal. No murmur heard. Pulmonary:     Effort: Pulmonary effort is normal.     Breath sounds:  Normal breath sounds and air entry. No rales.  Abdominal:     General: Abdomen is flat. Bowel sounds are normal.  Musculoskeletal:     Right lower leg: No edema.     Left lower leg: No edema.  Neurological:     General: No focal deficit present.     Mental Status: He is alert and oriented to person, place, and time.     Gait: Gait is intact.  Psychiatric:        Mood and Affect: Mood and affect normal.      No results found for any visits on 03/04/24.    The ASCVD Risk score (Arnett DK, et al., 2019) failed to calculate for the following reasons:   The 2019 ASCVD risk score is only valid for ages 10 to 87   Risk score cannot be  calculated because patient has a medical history suggesting prior/existing ASCVD    Assessment & Plan:  Acute ischemic stroke [facility]) Assessment & Plan: S/p TPA, strength is equal on both sides and his neurologic exam is at his baseline. He did not require physical therapy. He is now on eliquis  due to a new finding of paroxysmal atrial fibrillation that was seen on his pacemaker interrogation/ pt requesting assistance with affording the eliquis . I gave him 1 months worth of samples today and will consult pharmacy for the PAP enrollment.   Orders: -     AMB Referral VBCI Care Management  Primary hypertension Assessment & Plan: Bp  is slightly high today however after a CVA it should be slightly elevated to ensure cerebral perfusion. I recommend continuing the current medications listed below. Current hypertension medications:       Sig   carvedilol  (COREG ) 12.5 MG tablet (Taking) TAKE 1 TABLET BY MOUTH 2 TIMES A DAY TAKE MORNING DOSE IF SYSTOLIC BLOOD PRESSURE IS 110 OR HIGHER   lisinopril  (ZESTRIL ) 5 MG tablet (Taking) Take 1 tablet (5 mg total) by mouth daily.        Orders: -     AMB Referral VBCI Care Management  I spent 30 minutes with patient today reviewing labs, documentation, imaging from his hospital stay, counseling patient on eliquis  therapy, etc.   No follow-ups on file.    Heron CHRISTELLA Sharper, MD

## 2024-03-05 ENCOUNTER — Telehealth: Payer: Self-pay | Admitting: *Deleted

## 2024-03-05 NOTE — Progress Notes (Signed)
 Care Guide Pharmacy Note  03/05/2024 Name: [name] MRN: [id number] DOB: Jan 21, 1941  Referred By: Ozell Heron HERO, MD Reason for referral: Complex Care Management (Outreach to schedule referral with pharmacist )   Nathan [name] is a 83 y.o. year old male who is a primary care patient of Ozell Heron HERO, MD.  AKSHAY SPANG was referred to the pharmacist for assistance related to: Eliquis   Successful contact was made with the patient to discuss pharmacy services including being ready for the pharmacist to call at least 5 minutes before the scheduled appointment time and to have medication bottles and any blood pressure readings ready for review. The patient agreed to meet with the pharmacist via telephone visit on 03/20/2024  Thedford Franks, CMA [place]  [facility], [facility] Guide Direct Dial: [phone]  Fax: [phone] Website: [email or web address]

## 2024-03-06 NOTE — Assessment & Plan Note (Signed)
 Bp  is slightly high today however after a CVA it should be slightly elevated to ensure cerebral perfusion. I recommend continuing the current medications listed below. Current hypertension medications:       Sig   carvedilol  (COREG ) 12.5 MG tablet (Taking) TAKE 1 TABLET BY MOUTH 2 TIMES A DAY TAKE MORNING DOSE IF SYSTOLIC BLOOD PRESSURE IS 110 OR HIGHER   lisinopril  (ZESTRIL ) 5 MG tablet (Taking) Take 1 tablet (5 mg total) by mouth daily.

## 2024-03-06 NOTE — Assessment & Plan Note (Signed)
 S/p TPA, strength is equal on both sides and his neurologic exam is at his baseline. He did not require physical therapy. He is now on eliquis  due to a new finding of paroxysmal atrial fibrillation that was seen on his pacemaker interrogation/ pt requesting assistance with affording the eliquis . I gave him 1 months worth of samples today and will consult pharmacy for the PAP enrollment.

## 2024-03-08 ENCOUNTER — Ambulatory Visit: Admitting: Physical Therapy

## 2024-03-15 NOTE — Telephone Encounter (Signed)
 Faxed application to [employer]-patients portion

## 2024-03-19 ENCOUNTER — Telehealth: Payer: Self-pay | Admitting: Family Medicine

## 2024-03-19 NOTE — Telephone Encounter (Signed)
 Copied from CRM [id number]. Topic: Clinical - Medication Refill >> Mar 19, 2024  3:32 PM Mesmerise C wrote: Medication:  atorvastatin  (LIPITOR) 40 MG tablet   Has the patient contacted their pharmacy? Yes (Agent: If no, request that the patient contact the pharmacy for the refill. If patient does not wish to contact the pharmacy document the reason why and proceed with request.) (Agent: If yes, when and what did the pharmacy advise?)  This is the patient's preferred pharmacy:  [facility] [id number] [place], [place] - [address] [place] [address] Phone: [phone] Fax: [phone]  Is this the correct pharmacy for this prescription? Yes If no, delete pharmacy and type the correct one.   Has the prescription been filled recently? No  Is the patient out of the medication? No  Has the patient been seen for an appointment in the last year OR does the patient have an upcoming appointment? Yes  Can we respond through MyChart? Yes  Agent: Please be advised that Rx refills may take up to 3 business days. We ask that you follow-up with your pharmacy.

## 2024-03-20 ENCOUNTER — Other Ambulatory Visit

## 2024-03-20 DIAGNOSIS — Z79899 Other long term (current) drug therapy: Secondary | ICD-10-CM

## 2024-03-20 NOTE — Telephone Encounter (Signed)
 Provider portion being sent to [employer]

## 2024-03-20 NOTE — Progress Notes (Addendum)
   03/20/2024  Patient ID: Nathan [name], male   DOB: Sep 04, 1940, 83 y.o.   MRN: [id number]  Received request from PCP to contact patient regarding medication access. Reported at last PCP visit that Eliquis  was too expensive.  Contacted patient via telephone. Patient reports he turned in application at end of July. Contacted [employer] and they are missing the provider portion to continue processing.   Routing to CPhT to assist with completion of provider portion of application.  Follow Up: 03/25/24  [name], PharmD Clinical Pharmacist [phone]

## 2024-03-21 ENCOUNTER — Other Ambulatory Visit: Payer: Self-pay

## 2024-03-24 NOTE — Progress Notes (Unsigned)
 PATIENT: Nathan [name] DOB: [birth year]  REASON FOR VISIT: follow up HISTORY FROM: patient PRIMARY NEUROLOGIST: Dr. Rosemarie  No chief complaint on file.    HISTORY OF PRESENT ILLNESS: Today   Nathan [name] is a 83 y.o. male who has been followed in this office for ***. Returns today for follow-up.   Imaging:   MRI BRAIN:IMPRESSION: 1. Age-related atrophy and a mild to moderate nonspecific cerebral white matter disease. No apparent acute process.   CTA HEAD/NECK:IMPRESSION: No large vessel occlusion.   Diminutive appearance of multiple M2 branches of the left MCA without occlusion.   Atherosclerosis of the carotid siphons resulting in mild stenosis.   Atherosclerosis at the left carotid bifurcation resulting in approximately 40% stenosis.   FU: Labs: ECHO: Discharge note Work? OSA?   HISTORY (copied from Hospital)Patient was given TNK to treat potential stroke, and speech deficits improved.  MRI the following day revealed no acute abnormality, and this was likely a stroke aborted by TNK.  Patient did have atherosclerosis at the left carotid bifurcation and was seen by vascular surgery.   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 Small vessel disease.  ASPECTS 10.    CTA head & neck no for LVO, diminutive appearance of multiple left MCA M2 branches without occlusion, atherosclerosis of bilateral carotid siphons, atherosclerosis at left carotid bifurcation resulting in 40% stenosis with high risk plqaue MRI no acute abnormality, age-related atrophy and chronic ischemic white matter changes Pacemaker interrogation no A-fib seen 2D Echo EF 60 to 65%, no PFO CUS b/l 1-39% stenosis LDL 118 HgbA1c 6.3 UDS neg VTE prophylaxis -SCDs aspirin  81 mg daily prior to admission, now on aspirin  81 mg daily and Plavix  75 mg daily for 3 months and the plavix   alone Therapy recommendations:  outpt PT Disposition: Pending   Left ICA athero CTA head and neck atherosclerosis at left carotid bifurcation resulting in 40% stenosis Seems to be high risk plaque based on morphology CUS no significant stenosis Patient seen by [facility] and recommend aggressive medical treatment Continue DAPT Outpt follow up with [facility]VS   Hypertension Home meds: Lisinopril  5 mg daily, carvedilol  12.5 mg twice daily Stable Avoid low BP Long term BP goal normotensive   Hyperlipidemia Home meds: Ezetimibe  10 mg daily  LDL 118, goal < 70 Add atorvastatin  40 mg daily Continue home zetia  Continue statin and zetia  at discharge   Diabetes type II poorly controlled Home meds: Metformin  1000 mg twice daily, nateglinide  120 mg 3 times daily with meals HgbA1c 6.3, goal < 7.0 CBGs SSI Recommend close follow-up with PCP for better DM control   Other Stroke Risk Factors Advanced age    REVIEW OF SYSTEMS: Out of a complete 14 system review of symptoms, the patient complains only of the following symptoms, and all other reviewed systems are negative.  ALLERGIES: Allergies  Allergen Reactions   Actos [Pioglitazone] Other (See Comments)    Achilles pain Ocular symptoms (twice)   Benicar [Olmesartan] Swelling    Hypotension    Diovan [Valsartan] Other (See Comments)    Dizziness    Flexeril [Cyclobenzaprine] Other (See Comments)    Unknown reaction   Niacin And Related Itching and Other (See Comments)    Flushing    Niaspan [Niacin] Other (See Comments)    Flushing   Sulfonamide Derivatives Other (See Comments)    Unknown reaction  HOME MEDICATIONS: Outpatient Medications Prior to Visit  Medication Sig Dispense Refill   Accu-Chek FastClix Lancets MISC Use as directed four times a day 400 each 6   apixaban  (ELIQUIS ) 5 MG TABS tablet Take 1 tablet (5 mg total) by mouth 2 (two) times daily. 60 tablet 10   atorvastatin  (LIPITOR) 40 MG tablet Take 1 tablet (40 mg  total) by mouth daily. 30 tablet 1   carvedilol  (COREG ) 12.5 MG tablet TAKE 1 TABLET BY MOUTH 2 TIMES A DAY *** TAKE MORNING DOSE IF SYSTOLIC BLOOD PRESSURE IS 110 OR HIGHER*** 180 tablet 0   ezetimibe  (ZETIA ) 10 MG tablet Take 1 tablet (10 mg total) by mouth daily. 90 tablet 3   lisinopril  (ZESTRIL ) 5 MG tablet Take 1 tablet (5 mg total) by mouth daily. 90 tablet 1   MAGNESIUM PO Take 1 tablet by mouth daily at 6 (six) AM.     metFORMIN  (GLUCOPHAGE ) 500 MG tablet Take 2 tablets (1,000 mg total) by mouth 2 (two) times daily with a meal. 360 tablet 3   nateglinide  (STARLIX ) 120 MG tablet Take 1 tablet (120 mg total) by mouth 3 (three) times daily with meals. 270 tablet 3   triamcinolone  cream (KENALOG ) 0.1 % Apply 1 Application topically 2 (two) times daily as needed (eczema). 45 g 2   VITAMIN D, CHOLECALCIFEROL, PO Take 1 tablet by mouth daily at 6 (six) AM.     No facility-administered medications prior to visit.    PAST MEDICAL HISTORY: Past Medical History:  Diagnosis Date   Atrioventricular block, 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   Qualifier: Diagnosis of  By: Lavona, MD, CODY Agent     DM (diabetes mellitus) (HCC)    HTN (hypertension)    Hyperlipidemia    Nephrolithiasis    Pacemaker-Medtronic 05/10/2011   Medtronic device implanted January 2010    Vertigo     PAST SURGICAL HISTORY: Past Surgical History:  Procedure Laterality Date   APPENDECTOMY     PACEMAKER PLACEMENT     PPM GENERATOR CHANGEOUT N/A 11/05/2021   Procedure: PPM GENERATOR CHANGEOUT;  Surgeon: Fernande Elspeth BROCKS, MD;  Location: [facility] INVASIVE CV LAB;  Service: Cardiovascular;  Laterality: N/A;    FAMILY HISTORY: Family History  Problem Relation Age of Onset   Cancer - Colon Father     SOCIAL HISTORY: Social History   Socioeconomic History   Marital status: Married    Spouse name: Not on file   Number of children: Not on file   Years of education: Not on file   Highest education level: Not on file   Occupational History   Not on file  Tobacco Use   Smoking status: Never   Smokeless tobacco: Never  Vaping Use   Vaping status: Never Used  Substance and Sexual Activity   Alcohol use: Not Currently    Comment: occasional   Drug use: No   Sexual activity: Not Currently  Other Topics Concern   Not on file  Social History Narrative   Not on file   Social Drivers of Health   Financial Resource Strain: Low Risk  (01/01/2024)   Overall Financial Resource Strain (CARDIA)    Difficulty of Paying Living Expenses: Not hard at all  Food Insecurity: No Food Insecurity (02/23/2024)   Hunger Vital Sign    Worried About Running Out of Food in the Last Year: Never true    Ran Out of Food in the Last Year: Never true  Transportation Needs: No Transportation Needs (02/23/2024)  PRAPARE - Administrator, Civil Service (Medical): No    Lack of Transportation (Non-Medical): No  Physical Activity: Inactive (01/01/2024)   Exercise Vital Sign    Days of Exercise per Week: 0 days    Minutes of Exercise per Session: 0 min  Stress: No Stress Concern Present (01/01/2024)   [employer] of Occupational Health - Occupational Stress Questionnaire    Feeling of Stress : Not at all  Social Connections: Moderately Isolated (02/20/2024)   Social Connection and Isolation Panel    Frequency of Communication with Friends and Family: More than three times a week    Frequency of Social Gatherings with Friends and Family: More than three times a week    Attends Religious Services: Never    Database administrator or Organizations: No    Attends Banker Meetings: Never    Marital Status: Married  Catering manager Violence: Not At Risk (02/23/2024)   Humiliation, Afraid, Rape, and Kick questionnaire    Fear of Current or Ex-Partner: No    Emotionally Abused: No    Physically Abused: No    Sexually Abused: No      PHYSICAL EXAM  There were no vitals filed for this visit. There is  no height or weight on file to calculate BMI.  Generalized: Well developed, in no acute distress   Neurological examination  Mentation: Alert oriented to time, place, history taking. Follows all commands speech and language fluent Cranial nerve II-XII: Pupils were equal round reactive to light. Extraocular movements were full, visual field were full on confrontational test. Facial sensation and strength were normal. Uvula tongue midline. Head turning and shoulder shrug  were normal and symmetric. Motor: The motor testing reveals 5 over 5 strength of all 4 extremities. Good symmetric motor tone is noted throughout.  Sensory: Sensory testing is intact to soft touch on all 4 extremities. No evidence of extinction is noted.  Coordination: Cerebellar testing reveals good finger-nose-finger and heel-to-shin bilaterally.  Gait and station: Gait is normal. Tandem gait is normal. Romberg is negative. No drift is seen.  Reflexes: Deep tendon reflexes are symmetric and normal bilaterally.   DIAGNOSTIC DATA (LABS, IMAGING, TESTING) - I reviewed patient records, labs, notes, testing and imaging myself where available.  Lab Results  Component Value Date   WBC 7.6 02/22/2024   HGB 13.2 02/22/2024   HCT 38.1 (L) 02/22/2024   MCV 89.4 02/22/2024   PLT 201 02/22/2024      Component Value Date/Time   NA 132 (L) 02/22/2024 0333   NA 138 10/29/2021 1603   K 3.7 02/22/2024 0333   CL 98 02/22/2024 0333   CO2 22 02/22/2024 0333   GLUCOSE 139 (H) 02/22/2024 0333   BUN 10 02/22/2024 0333   BUN 13 10/29/2021 1603   CREATININE 0.86 02/22/2024 0333   CALCIUM  8.7 (L) 02/22/2024 0333   PROT 6.7 02/19/2024 2059   ALBUMIN 3.8 02/19/2024 2059   AST 17 02/19/2024 2059   ALT 16 02/19/2024 2059   ALKPHOS 89 02/19/2024 2059   BILITOT 1.1 02/19/2024 2059   GFRNONAA >60 02/22/2024 0333   GFRAA >60 05/18/2017 1521   Lab Results  Component Value Date   CHOL 170 02/20/2024   HDL 36 (L) 02/20/2024   LDLCALC 118  (H) 02/20/2024   TRIG 79 02/20/2024   CHOLHDL 4.7 02/20/2024   Lab Results  Component Value Date   HGBA1C 6.3 (H) 02/21/2024   No results found for: [id number]  Lab Results  Component Value Date   TSH 1.065 ***Test methodology is 3rd generation TSH*** 08/24/2008      ASSESSMENT AND PLAN 83 y.o. year old male  has a past medical history of Atrioventricular block, complete-intermittent (03/30/2009), DM (diabetes mellitus) (HCC), HTN (hypertension), Hyperlipidemia, Nephrolithiasis, Pacemaker-Medtronic (05/10/2011), and Vertigo. here with ***     Continue {anticoagulants:31417}  and ***  for secondary stroke prevention.   Discussed secondary stroke prevention measures and importance of close PCP follow up for aggressive stroke risk factor management. I have gone over the pathophysiology of stroke, warning signs and symptoms, risk factors and their management in some detail with instructions to go to the closest emergency room for symptoms of concern. HTN: BP goal <130/90.  Stable on *** per PCP HLD: LDL goal <70. Recent LDL 118.  DMII: A1c goal<7.0. Recent A1c 6.3.  Encouraged patient to monitor diet and encouraged exercise FU with our office ***  No orders of the defined types were placed in this encounter.  No orders of the defined types were placed in this encounter.     Duwaine Russell, MSN, NP-C 03/24/2024, 2:32 PM [facility] [address] [place], [place] [address] [phone]

## 2024-03-25 ENCOUNTER — Other Ambulatory Visit (HOSPITAL_COMMUNITY): Payer: Self-pay

## 2024-03-25 ENCOUNTER — Telehealth: Payer: Self-pay

## 2024-03-25 ENCOUNTER — Encounter: Payer: Self-pay | Admitting: Adult Health

## 2024-03-25 ENCOUNTER — Other Ambulatory Visit (INDEPENDENT_AMBULATORY_CARE_PROVIDER_SITE_OTHER)

## 2024-03-25 ENCOUNTER — Ambulatory Visit (INDEPENDENT_AMBULATORY_CARE_PROVIDER_SITE_OTHER): Admitting: Adult Health

## 2024-03-25 VITALS — BP 110/61 | HR 73 | Ht 72.0 in | Wt 182.0 lb

## 2024-03-25 DIAGNOSIS — Z79899 Other long term (current) drug therapy: Secondary | ICD-10-CM

## 2024-03-25 DIAGNOSIS — I4891 Unspecified atrial fibrillation: Secondary | ICD-10-CM

## 2024-03-25 DIAGNOSIS — I639 Cerebral infarction, unspecified: Secondary | ICD-10-CM | POA: Diagnosis not present

## 2024-03-25 DIAGNOSIS — E782 Mixed hyperlipidemia: Secondary | ICD-10-CM | POA: Diagnosis not present

## 2024-03-25 MED ORDER — ATORVASTATIN CALCIUM 40 MG PO TABS: 40.0000 mg | ORAL_TABLET | Freq: Every day | ORAL | 3 refills | Status: AC

## 2024-03-25 NOTE — Progress Notes (Signed)
   03/25/2024  Patient ID: Nathan [name], male   DOB: 1941/03/05, 83 y.o.   MRN: [id number]  Received request from PCP to contact patient regarding medication access. Reported at last PCP visit that Eliquis  was too expensive.  Contacted patient via telephone. Eliquis  PAP application still pending through [facility].  Dicussed with patient that it is unlikely he will be approved at this point in the year due to not having met the 3% OOP spending requirement. [facility] and is going to see if he can get medication at no cost through their program.  Offered to schedule follow up to check back in. Patient declined and said he will call back with an update or if he has any other needs. Will make sure to let us  know if he cannot get eliquis  from [facility] so we can explore other options if needed.  [name], PharmD Clinical Pharmacist [phone]

## 2024-03-25 NOTE — Progress Notes (Signed)
 I agree with the above plan

## 2024-03-25 NOTE — Progress Notes (Signed)
   03/25/2024  Patient ID: Nathan [name], male   DOB: Sep 16, 1940, 83 y.o.   MRN: [id number]  Patient called in asking for refill on atorvastatin . Explained that PCP declined refill request and preferred cardiology prescribe.   Informed patient that a 30ds is on file at [facility], coordinated refill on behalf of patient.  Patient does request that cardiology be notified that refills will be needed going forward and that he would like sent to [facility].  [name], PharmD Clinical Pharmacist [phone]

## 2024-03-25 NOTE — Patient Instructions (Signed)
 Your Plan:  Continue Eliquis   Blood pressure goal <130/90 Cholesterol LDL goal <70 Diabetes goal A1c <7 Monitor diet and try to exercise   Thank you for coming to see [place] at [facility]. I hope we have been able to provide you high quality care today.  You may receive a patient satisfaction survey over the next few weeks. We would appreciate your feedback and comments so that we may continue to improve ourselves and the health of our patients.

## 2024-03-25 NOTE — Addendum Note (Signed)
 Addended by: Emmarae Cowdery W on: 03/25/2024 02:44 PM   Modules accepted: Orders

## 2024-03-25 NOTE — Telephone Encounter (Signed)
 Pt  is requesting a refill on medication atorvastatin . This medication as not prescribe by our Cardiologist. Would our Cardiologist like to refill this medication? Please address

## 2024-03-26 ENCOUNTER — Other Ambulatory Visit (HOSPITAL_COMMUNITY): Payer: Self-pay

## 2024-03-29 ENCOUNTER — Telehealth: Payer: Self-pay

## 2024-03-29 NOTE — Progress Notes (Signed)
   03/29/2024  Patient ID: Nathan [name], male   DOB: Jun 06, 1941, 83 y.o.   MRN: [id number]  Patient called in to provide update [facility]. Reports that [facility] order for Eliquis  to [facility] and he is anticipating getting the medication for free with no concerns.  Still has 3 weeks of samples while he waits for this to process. Instructed patient to reach out if any delays or other concerns arise.  [name], PharmD Clinical Pharmacist [phone]

## 2024-04-05 ENCOUNTER — Telehealth: Payer: Self-pay

## 2024-04-05 NOTE — Progress Notes (Signed)
   04/05/2024  Patient ID: Nathan [name], male   DOB: July 03, 1941, 83 y.o.   MRN: [id number]  Patient called to report that he has successfully obtained Eliquis  5mg  at no cost through his [facility]. Advised patient to inform us  in future if this changes so we can try to assist.  Nathan [name], PharmD Clinical Pharmacist [phone]

## 2024-04-12 NOTE — Telephone Encounter (Signed)
   Cancelled application

## 2024-04-17 ENCOUNTER — Telehealth: Payer: Self-pay | Admitting: Adult Health

## 2024-04-17 NOTE — Telephone Encounter (Signed)
 I called the patient back and was able to schedule him next week with Megan on 9/23 (ok per [facility]). Pt is aware that if his symptoms worsen or he develops new symptoms to get to a hospital right away.

## 2024-04-17 NOTE — Telephone Encounter (Signed)
 Returned pt's call. He states at the time of his visit on 8/25 he was having intermittent light numbness and tingling. Around 1.5 weeks ago he noticed his face was more numb on the R. He states the area is his temple, around eye, down to upper lip and nose. He states it is numb all of the time but the severity varies. He denies any facial droop, weakness, vision trouble, speech trouble, dizziness, or any numbness/tingling of extremities. He is aware that if any new symptoms develop or if he worsens, call 911. In the meantime will check with [facility] NP and call him back. Patient also mentioned that many years ago he was diagnosed by Dr Maurice as having Diabetic Mononeuropathy and he wonders if this is related. At that time he was having rib pain. He states he still has the symptoms in his toes even now but not worse. He was appreciative for the call.

## 2024-04-17 NOTE — Telephone Encounter (Signed)
 In the office note I documented that he told me that his symptoms had resolved.  If the symptoms have come back then we need to see him in the office.  If Dr. Rosemarie has an opening I would prefer for him to see Dr. Rosemarie as this may be a new issue.  If not okay to schedule with me when Dr. Rosemarie is in the office.  If he feels that the symptoms are getting worse then he may need to go to the ED.

## 2024-04-17 NOTE — Telephone Encounter (Signed)
 Tingling and numbness in face has worsen in pt's face.  Pt states he was told to call and report if this were the case, please call pt to discuss.

## 2024-04-23 ENCOUNTER — Ambulatory Visit (INDEPENDENT_AMBULATORY_CARE_PROVIDER_SITE_OTHER): Admitting: Adult Health

## 2024-04-23 ENCOUNTER — Encounter: Payer: Self-pay | Admitting: Adult Health

## 2024-04-23 VITALS — BP 152/76 | HR 43 | Ht 72.0 in | Wt 188.0 lb

## 2024-04-23 DIAGNOSIS — R200 Anesthesia of skin: Secondary | ICD-10-CM

## 2024-04-23 DIAGNOSIS — I639 Cerebral infarction, unspecified: Secondary | ICD-10-CM | POA: Diagnosis not present

## 2024-04-23 NOTE — Progress Notes (Signed)
 PATIENT: Nathan [name] DOB: 06-07-1941  REASON FOR VISIT: follow up HISTORY FROM: patient PRIMARY NEUROLOGIST: Dr. Rosemarie  Chief Complaint  Patient presents with   Follow-up    Pt in 4 alone Pt states worsening facial pain /numbness on right side of face  Pt states stroke July 2025      HISTORY OF PRESENT ILLNESS: Today 04/23/24:  Nathan [name] is a 83 y.o. male with a history of left brain stroke aborted by TNK.  He returns today to discuss ongoing numbness in the right side of the face.  He states that it comes and goes and it varies in how long it stays.  Typically occurs above the right eyebrow and around the eye and on top of the lip.  He seems to notice it more if his blood pressure increases.  It does typically happen daily.  He did go for 1 to 2 weeks with no symptoms at all.  He states that is not painful.  No lesions on face.  Denies it being itchy.  He states now his nose will sometimes run when he is eating.  Reports that 3 years ago he had cervical spine injections and is controlled his discomfort.  He states that he is beginning to have some neck pain and plans to follow-up with his orthopedist.  Denies any additional strokelike symptoms.  Returns today for an evaluation.    8/25/25[name] is a 83 y.o. male here for hospital follow-up after left brain stroke aborted by TNK.  He returns today for follow-up.  He reports that his symptoms have completely resolved.  Reports that when he left the hospital he had mild tingling in the right side of the face from the ear down to the jaw however he states that within the last 1 to 2 weeks that has resolved.  He states that on 7/25 several days after being discharged from the hospital he was notified by his cardiologist that they saw A-fib on his pacemaker.  He has since been started on Eliquis .  I reviewed note from cardiology it appears that they discussed with Dr. Jerri who was amenable to him being on Eliquis .  He is  currently on Lipitor and Zetia  for his cholesterol.  He reports that this time he is not having any muscle cramps with Lipitor.  He remains on metformin  and Nateglinide  for diabetes.  Overall the patient is doing relatively well.  He has a follow-up with vein and vascular at the end of the year.  Has an additional follow-up with his primary care in October.  Reports that he just finished with physical therapy.  Denies snoring, daytime sleepiness and fatigue.  He returns today for an evaluation.  Imaging:   MRI BRAIN:IMPRESSION: 1. Age-related atrophy and a mild to moderate nonspecific cerebral white matter disease. No apparent acute process.   CTA HEAD/NECK:IMPRESSION: No large vessel occlusion.   Diminutive appearance of multiple M2 branches of the left MCA without occlusion.   Atherosclerosis of the carotid siphons resulting in mild stenosis.   Atherosclerosis at the left carotid bifurcation resulting in approximately 40% stenosis.    HISTORY (copied from Hospital)Patient was given TNK to treat potential stroke, and speech deficits improved.  MRI the following day revealed no acute abnormality, and this was likely a stroke aborted by TNK.  Patient did have atherosclerosis at the left carotid bifurcation and was seen by vascular surgery.   Left brain stroke aborted by TNK, etiology uncertain,  probably large vessel disease from left ICA athero and stenosis  Code Stroke CT head No acute abnormality. Small vessel disease.  ASPECTS 10.    CTA head & neck no for LVO, diminutive appearance of multiple left MCA M2 branches without occlusion, atherosclerosis of bilateral carotid siphons, atherosclerosis at left carotid bifurcation resulting in 40% stenosis with high risk plqaue MRI no acute abnormality, age-related atrophy and chronic ischemic white matter changes Pacemaker interrogation no A-fib seen 2D Echo EF 60 to 65%, no PFO CUS b/l 1-39% stenosis LDL 118 HgbA1c 6.3 UDS neg VTE  prophylaxis -SCDs aspirin  81 mg daily prior to admission, now on aspirin  81 mg daily and Plavix  75 mg daily for 3 months and the plavix  alone Therapy recommendations:  outpt PT Disposition: Pending   Left ICA athero CTA head and neck atherosclerosis at left carotid bifurcation resulting in 40% stenosis Seems to be high risk plaque based on morphology CUS no significant stenosis Patient seen by [facility] and recommend aggressive medical treatment Continue DAPT Outpt follow up with [facility]   Hypertension Home meds: Lisinopril  5 mg daily, carvedilol  12.5 mg twice daily Stable Avoid low BP Long term BP goal normotensive   Hyperlipidemia Home meds: Ezetimibe  10 mg daily  LDL 118, goal < 70 Add atorvastatin  40 mg daily Continue home zetia  Continue statin and zetia  at discharge   Diabetes type II poorly controlled Home meds: Metformin  1000 mg twice daily, nateglinide  120 mg 3 times daily with meals HgbA1c 6.3, goal < 7.0 CBGs SSI Recommend close follow-up with PCP for better DM control   Other Stroke Risk Factors Advanced age    REVIEW OF SYSTEMS: Out of a complete 14 system review of symptoms, the patient complains only of the following symptoms, and all other reviewed systems are negative.  ALLERGIES: Allergies  Allergen Reactions   Actos [Pioglitazone] Other (See Comments)    Achilles pain Ocular symptoms (twice)   Benicar [Olmesartan] Swelling    Hypotension    Diovan [Valsartan] Other (See Comments)    Dizziness    Flexeril [Cyclobenzaprine] Other (See Comments)    Unknown reaction   Niacin And Related Itching and Other (See Comments)    Flushing    Niaspan [Niacin] Other (See Comments)    Flushing   Sulfonamide Derivatives Other (See Comments)    Unknown reaction    HOME MEDICATIONS: Outpatient Medications Prior to Visit  Medication Sig Dispense Refill   Accu-Chek FastClix Lancets MISC Use as directed four times a day 400 each 6   apixaban  (ELIQUIS ) 5 MG TABS  tablet Take 1 tablet (5 mg total) by mouth 2 (two) times daily. 60 tablet 10   atorvastatin  (LIPITOR) 40 MG tablet Take 1 tablet (40 mg total) by mouth daily. 30 tablet 1   carvedilol  (COREG ) 12.5 MG tablet TAKE 1 TABLET BY MOUTH 2 TIMES A DAY TAKE MORNING DOSE IF SYSTOLIC BLOOD PRESSURE IS 110 OR HIGHER 180 tablet 0   ezetimibe  (ZETIA ) 10 MG tablet Take 1 tablet (10 mg total) by mouth daily. 90 tablet 3   lisinopril  (ZESTRIL ) 5 MG tablet Take 1 tablet (5 mg total) by mouth daily. 90 tablet 1   MAGNESIUM PO Take 1 tablet by mouth daily at 6 (six) AM.     metFORMIN  (GLUCOPHAGE ) 500 MG tablet Take 2 tablets (1,000 mg total) by mouth 2 (two) times daily with a meal. 360 tablet 3   nateglinide  (STARLIX ) 120 MG tablet Take 1 tablet (120 mg total) by mouth 3 (  three) times daily with meals. 270 tablet 3   triamcinolone  cream (KENALOG ) 0.1 % Apply 1 Application topically 2 (two) times daily as needed (eczema). 45 g 2   VITAMIN D, CHOLECALCIFEROL, PO Take 1 tablet by mouth daily at 6 (six) AM.     No facility-administered medications prior to visit.    PAST MEDICAL HISTORY: Past Medical History:  Diagnosis Date   Atrioventricular block, 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   Qualifier: Diagnosis of  By: Lavona, MD, CODY Agent     DM (diabetes mellitus) (HCC)    HTN (hypertension)    Hyperlipidemia    Nephrolithiasis    Pacemaker-Medtronic 05/10/2011   Medtronic device implanted January 2010    Vertigo     PAST SURGICAL HISTORY: Past Surgical History:  Procedure Laterality Date   APPENDECTOMY     PACEMAKER PLACEMENT     PPM GENERATOR CHANGEOUT N/A 11/05/2021   Procedure: PPM GENERATOR CHANGEOUT;  Surgeon: Fernande Elspeth BROCKS, MD;  Location[facility] INVASIVE CV LAB;  Service: Cardiovascular;  Laterality: N/A;    FAMILY HISTORY: Family History  Problem Relation Age of Onset   Cancer - Colon Father    Stroke Neg Hx     SOCIAL HISTORY: Social History   Socioeconomic History   Marital status:  Married    Spouse name: Not on file   Number of children: Not on file   Years of education: Not on file   Highest education level: Not on file  Occupational History   Not on file  Tobacco Use   Smoking status: Never   Smokeless tobacco: Never  Vaping Use   Vaping status: Never Used  Substance and Sexual Activity   Alcohol use: Not Currently    Comment: occasional   Drug use: No   Sexual activity: Not Currently  Other Topics Concern   Not on file  Social History Narrative   Pt lives with family   Retired    Chief Executive Officer Drivers of Corporate investment banker Strain: Low Risk  (01/01/2024)   Overall Financial Resource Strain (CARDIA)    Difficulty of Paying Living Expenses: Not hard at all  Food Insecurity: No Food Insecurity (02/23/2024)   Hunger Vital Sign    Worried About Running Out of Food in the Last Year: Never true    Ran Out of Food in the Last Year: Never true  Transportation Needs: No Transportation Needs (02/23/2024)   PRAPARE - Administrator, Civil Service (Medical): No    Lack of Transportation (Non-Medical): No  Physical Activity: Inactive (01/01/2024)   Exercise Vital Sign    Days of Exercise per Week: 0 days    Minutes of Exercise per Session: 0 min  Stress: No Stress Concern Present (01/01/2024)   [employer] of Occupational Health - Occupational Stress Questionnaire    Feeling of Stress : Not at all  Social Connections: Moderately Isolated (02/20/2024)   Social Connection and Isolation Panel    Frequency of Communication with Friends and Family: More than three times a week    Frequency of Social Gatherings with Friends and Family: More than three times a week    Attends Religious Services: Never    Database administrator or Organizations: No    Attends Banker Meetings: Never    Marital Status: Married  Catering manager Violence: Not At Risk (02/23/2024)   Humiliation, Afraid, Rape, and Kick questionnaire    Fear of Current or  Ex-Partner: No    Emotionally  Abused: No    Physically Abused: No    Sexually Abused: No      PHYSICAL EXAM  Vitals:   04/23/24 0911  BP: (!) 152/76  Pulse: (!) 43  Weight: 188 lb (85.3 kg)  Height: 6' (1.829 m)   Body mass index is 25.5 kg/m.  Generalized: Well developed, in no acute distress   Neurological examination  Mentation: Alert oriented to time, place, history taking. Follows all commands speech and language fluent Cranial nerve II-XII: Pupils were equal round reactive to light. Extraocular movements were full, visual field were full on confrontational test. Facial sensation and strength were normal. Uvula tongue midline. Head turning and shoulder shrug  were normal and symmetric. Motor: The motor testing reveals 5 over 5 strength of all 4 extremities. Good symmetric motor tone is noted throughout.  Sensory: Sensory testing is intact to soft touch on all 4 extremities. No evidence of extinction is noted.  Coordination: Cerebellar testing reveals good finger-nose-finger and heel-to-shin bilaterally.  Gait and station: Gait is normal.  Reflexes: Deep tendon reflexes are symmetric and normal bilaterally.   DIAGNOSTIC DATA (LABS, IMAGING, TESTING) - I reviewed patient records, labs, notes, testing and imaging myself where available.  Lab Results  Component Value Date   WBC 7.6 02/22/2024   HGB 13.2 02/22/2024   HCT 38.1 (L) 02/22/2024   MCV 89.4 02/22/2024   PLT 201 02/22/2024      Component Value Date/Time   NA 132 (L) 02/22/2024 0333   NA 138 10/29/2021 1603   K 3.7 02/22/2024 0333   CL 98 02/22/2024 0333   CO2 22 02/22/2024 0333   GLUCOSE 139 (H) 02/22/2024 0333   BUN 10 02/22/2024 0333   BUN 13 10/29/2021 1603   CREATININE 0.86 02/22/2024 0333   CALCIUM  8.7 (L) 02/22/2024 0333   PROT 6.7 02/19/2024 2059   ALBUMIN 3.8 02/19/2024 2059   AST 17 02/19/2024 2059   ALT 16 02/19/2024 2059   ALKPHOS 89 02/19/2024 2059   BILITOT 1.1 02/19/2024 2059    GFRNONAA >60 02/22/2024 0333   GFRAA >60 05/18/2017 1521   Lab Results  Component Value Date   CHOL 170 02/20/2024   HDL 36 (L) 02/20/2024   LDLCALC 118 (H) 02/20/2024   TRIG 79 02/20/2024   CHOLHDL 4.7 02/20/2024   Lab Results  Component Value Date   HGBA1C 6.3 (H) 02/21/2024   No results found for: VITAMINB12     ASSESSMENT AND PLAN 83 y.o. year old male  has a past medical history of Atrioventricular block, complete-intermittent (03/30/2009), DM (diabetes mellitus) (HCC), HTN (hypertension), Hyperlipidemia, Nephrolithiasis, Pacemaker-Medtronic (05/10/2011), and Vertigo. here with:  Left brain stroke aborted by TNK Numbness right side of face   Continue Eliquis  (apixaban ) daily  for secondary stroke prevention.   Discussed secondary stroke prevention measures and importance of close PCP follow up for aggressive stroke risk factor management. I have gone over the pathophysiology of stroke, warning signs and symptoms, risk factors and their management in some detail with instructions to go to the closest emergency room for symptoms of concern. HTN: BP goal <130/90.   HLD: LDL goal <70. Recent LDL 118.  Currently on Lipitor and Zetia  DMII: A1c goal<7.0. Recent A1c 6.3.  Currently on metformin  and nateglinide   Discussed with Dr. Onita.  This could be a residual symptom.  Does not feel any additional imaging is needed at this time.  As long the pain patient is not having any significant discomfort or new symptoms  we will continue to monitor at this time. FU with our office 6 to 8 months or sooner if needed     Duwaine Russell, MSN, NP-C 04/23/2024, 9:36 AM [facility] [address] [place], [place] [address] [phone]  The patient's condition requires frequent monitoring and adjustments in the treatment plan, reflecting the ongoing complexity of care.  This provider is the continuing focal point for all needed services for this condition.

## 2024-04-25 ENCOUNTER — Telehealth: Payer: Self-pay

## 2024-04-25 NOTE — Telephone Encounter (Signed)
   Pre-operative Risk Assessment    Patient Name: Nathan [name]  DOB: Dec 19, 1940 MRN: [id number]   Date of last office visit: 02/27/24  [name] Date of next office visit: NA   Request for Surgical Clearance    Procedure:  Right Directed C6-C7 ESI  Date of Surgery:  Clearance TBD                               Surgeon:  Dr. Deatrice Manus Surgeon's Group or Practice Name:  [facility] Phone number:  [phone] Fax number:  [phone]   Type of Clearance Requested:   - Medical  - Pharmacy:  Hold Apixaban  (Eliquis ) 3 days prior to   Type of Anesthesia:  Not Indicated   Additional requests/questions:    [name] [name]   04/25/2024, 5:16 PM

## 2024-04-26 NOTE — Telephone Encounter (Signed)
 Pharmacy please advise on holding Eliquis  prior to Right Directed C6-C7 ESI scheduled for TBD.(CBC/BMET) done on 02/22/2024. Thank you.

## 2024-04-29 NOTE — Telephone Encounter (Signed)
 2nd request received  Will send to the preop pool for preop app to review

## 2024-05-02 NOTE — Progress Notes (Signed)
 I agree with the above plan

## 2024-05-03 DIAGNOSIS — I4891 Unspecified atrial fibrillation: Secondary | ICD-10-CM | POA: Insufficient documentation

## 2024-05-03 DIAGNOSIS — F419 Anxiety disorder, unspecified: Secondary | ICD-10-CM | POA: Insufficient documentation

## 2024-05-03 DIAGNOSIS — H903 Sensorineural hearing loss, bilateral: Secondary | ICD-10-CM | POA: Insufficient documentation

## 2024-05-03 NOTE — Telephone Encounter (Addendum)
 Called pt and was leaving a message to call back and schedule an appt in office with Gen card APP for post hospital f/u nd preop clearance per preop APP Lamarr Satterfield, DNP. At the end of my message sounded like someone picked up the phone. I said hello but no one answered back.

## 2024-05-03 NOTE — Telephone Encounter (Signed)
   Name: Nathan [name]  DOB: [birth year]  MRN: [id number]  Primary Cardiologist: Fernande   Chart reviewed as part of pre-operative protocol coverage. The patient has an upcoming visit scheduled with Dr. Kennyth on 05/28/2024 at which time clearance can be addressed in case there are any issues that would impact surgical recommendations.  Right Directed C6-C7 ESI Is not scheduled until TBD as below. I added preop FYI to appointment note so that provider is aware to address at time of outpatient visit.  Per office protocol the cardiology provider should forward their finalized clearance decision and recommendations regarding antiplatelet therapy to the requesting party below.     Patient with diagnosis of A Fib on Eliquis  for anticoagulation. CVA on 02/19/24.    Procedure: Right Directed C6-C7 ESI  Date of procedure: TBD     CHA2DS2-VASc Score = 6  This indicates a 9.7% annual risk of stroke. The patient's score is based upon: CHF History: 0 HTN History: 1 Diabetes History: 1 Stroke History: 2 Vascular Disease History: 0 Age Score: 2 Gender Score: 0     CrCl 80 ml/min Platelet count 201K   Patient has not had an Afib/aflutter ablation or Watchman within the last 3 months or DCCV within the last 30 days    Former patient of Dr Fernande. Patient had CVA on 02/19/24. Has appt with Dr Kennyth on 05/28/24. IF patient needs to hold Eliquis  for 3 days will need Lovenox  bridge. Otherwise recommend postponing procedure. Will route to Dr Kennyth for input. I will route this message as FYI to requesting party and remove this message from the preop box as separate preop APP input not needed at this time.   Please call with any questions.  Lamarr Satterfield, NP  05/03/2024, 10:46 AM

## 2024-05-03 NOTE — Telephone Encounter (Signed)
 Patient with diagnosis of A Fib on Eliquis  for anticoagulation. CVA on 02/19/24.   Procedure: Right Directed C6-C7 ESI  Date of procedure: TBD   CHA2DS2-VASc Score = 6  This indicates a 9.7% annual risk of stroke. The patient's score is based upon: CHF History: 0 HTN History: 1 Diabetes History: 1 Stroke History: 2 Vascular Disease History: 0 Age Score: 2 Gender Score: 0   CrCl 80 ml/min Platelet count 201K  Patient has not had an Afib/aflutter ablation or Watchman within the last 3 months or DCCV within the last 30 days   Former patient of Dr Fernande. Patient had CVA on 02/19/24. Has appt with Dr Kennyth on 05/28/24. IF patient needs to hold Eliquis  for 3 days will need Lovenox  bridge. Otherwise recommend postponing procedure. Will route to Dr Kennyth for input.  **This guidance is not considered finalized until pre-operative APP has relayed final recommendations.**

## 2024-05-06 NOTE — Telephone Encounter (Addendum)
 Spoke with pt regarding scheduling a f/u/preop clearance appt. Pt stated he was at an appt and asked for a call back around 2pm. Pt was advise someone would give him a call around that time.

## 2024-05-06 NOTE — Telephone Encounter (Signed)
 Patient is returning call. Please advise?

## 2024-05-06 NOTE — Telephone Encounter (Addendum)
 I s/w the pt and I explained he would need an appt with gen card per preop APP. See notes. Pt aware Dr. Kennyth does not generally do preop clearances. Pt is agreeable to do an in office with gen card, but tells me he wants to still d/w Dr. Kennyth to get his input about the blood thinner. I said that is fine. Pt said thank you.   I will update Dr. Darlis office as well.

## 2024-05-07 NOTE — Telephone Encounter (Signed)
 Requesting office sent duplicate request. Please see the notes that the pt is seeing Dr. Kennyth to discuss about clearance and blood thinner hold.   Once the pt has been cleared provider will send clearance notes.

## 2024-05-07 NOTE — Progress Notes (Signed)
 Remote PPM Transmission

## 2024-05-09 ENCOUNTER — Ambulatory Visit: Admitting: Family Medicine

## 2024-05-09 ENCOUNTER — Encounter: Payer: Self-pay | Admitting: Family Medicine

## 2024-05-09 VITALS — BP 110/64 | HR 91 | Temp 98.2°F | Ht 72.0 in | Wt 181.2 lb

## 2024-05-09 DIAGNOSIS — E119 Type 2 diabetes mellitus without complications: Secondary | ICD-10-CM | POA: Diagnosis not present

## 2024-05-09 DIAGNOSIS — I10 Essential (primary) hypertension: Secondary | ICD-10-CM | POA: Diagnosis not present

## 2024-05-09 MED ORDER — LISINOPRIL 5 MG PO TABS: 5.0000 mg | ORAL_TABLET | Freq: Every day | ORAL | 1 refills | Status: AC

## 2024-05-09 MED ORDER — CARVEDILOL 12.5 MG PO TABS: 12.5000 mg | ORAL_TABLET | Freq: Two times a day (BID) | ORAL | 1 refills | Status: AC

## 2024-05-09 NOTE — Progress Notes (Signed)
 Established Patient Office Visit  Subjective   Patient ID: Nathan [name], male    DOB: [birth year]  Age: 83 y.o. MRN: [id number]  Chief Complaint  Patient presents with   Medical Management of Chronic Issues    HPI Discussed the use of AI scribe software for clinical note transcription with the patient, who gave verbal consent to proceed.  History of Present Illness   Nathan [name] is an 83 year old male with a history of stroke and diabetes who presents for a regular follow-up visit.  He recently resumed driving after his hospitalization for a stroke, with approval from his neurologist. He does not use a handicap sticker.  He has diabetes and experiences occasional pain in his big toe, described as an ache without color change or worsening with cold. He feels like he is walking on rocks when barefoot. He generally wears shoes indoors to protect his feet.  He encountered issues obtaining Eliquis  due to cost but acquired it through the [facility] with a minimal copay. He is also on lisinopril  and carvedilol , with upcoming refills needed. His cardiologist retired, and he is scheduled to meet a new one soon.  He has a longstanding fungal infection in his toenails, with bleeding when clipping and decreased sensation in his toes. He is cautious about cutting his toenails to avoid injury. He has a history of ingrown toenails, previously managed by a friend who is a Engineer, petroleum.       Current Outpatient Medications  Medication Instructions   Accu-Chek FastClix Lancets MISC Use as directed four times a day   apixaban  (ELIQUIS ) 5 mg, Oral, 2 times daily   atorvastatin  (LIPITOR) 40 mg, Oral, Daily   carvedilol  (COREG ) 12.5 mg, Oral, 2 times daily with meals   ezetimibe  (ZETIA ) 10 mg, Oral, Daily   lisinopril  (ZESTRIL ) 5 mg, Oral, Daily   MAGNESIUM PO 1 tablet, Daily   metFORMIN  (GLUCOPHAGE ) 1,000 mg, Oral, 2 times daily with meals   nateglinide  (STARLIX ) 120 mg, Oral, 3 times daily with  meals   triamcinolone  cream (KENALOG ) 0.1 % 1 Application, Topical, 2 times daily PRN   VITAMIN D, CHOLECALCIFEROL, PO 1 tablet, Daily    Patient Active Problem List   Diagnosis Date Noted   Anxiety disorder, unspecified 05/03/2024   Unspecified atrial fibrillation (HCC) 05/03/2024   Sensorineural hearing loss, bilateral 05/03/2024   Acute ischemic stroke (HCC) 02/19/2024   Statin intolerance 11/08/2023   HTN (hypertension) 09/05/2023   Pressure sore of left ischial area, stage I 03/28/2023   NSVT (nonsustained ventricular tachycardia) (HCC) 02/15/2023   Diabetes mellitus without complication (HCC) 11/08/2022   Mixed hyperlipidemia 11/08/2022   BPPV (benign paroxysmal positional vertigo), right 11/08/2022   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   Pacemaker-Medtronic 05/10/2011   OVERWEIGHT 03/30/2009   Atrioventricular block, 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     Review of Systems  All other systems reviewed and are negative.     Objective:     BP 110/64   Pulse 91   Temp 98.2 F (36.8 C) (Oral)   Ht 6' (1.829 m)   Wt 181 lb 3.2 oz (82.2 kg)   SpO2 100%   BMI 24.58 kg/m    Physical Exam Vitals reviewed.  Constitutional:      Appearance: Normal appearance. He is well-groomed and normal weight.  Eyes:     Extraocular Movements: Extraocular movements intact.     Conjunctiva/sclera: Conjunctivae normal.  Neck:     Thyroid : No thyromegaly.  Cardiovascular:     Rate and Rhythm: Normal rate and regular rhythm.     Heart sounds: S1 normal and S2 normal. No murmur heard. Pulmonary:     Effort: Pulmonary effort is normal.     Breath sounds: Normal breath sounds and air entry. No rales.  Abdominal:     General: Abdomen is flat. Bowel sounds are normal.  Musculoskeletal:     Right lower leg: No edema.     Left lower leg: No edema.  Neurological:     General: No focal deficit present.     Mental Status: He is alert and oriented to person, place, and time.      Gait: Gait is intact.  Psychiatric:        Mood and Affect: Mood and affect normal.    Diabetic Foot Exam - Simple   Simple Foot Form Diabetic Foot exam was performed with the following findings: Yes 05/09/2024 11:01 AM  Visual Inspection See comments: Yes Sensation Testing See comments: Yes Pulse Check Posterior Tibialis and Dorsalis pulse intact bilaterally: Yes Comments Mild decreased sensation in the toes BL, he has all toenails infected with onychomycosis and there is some old blood on the 5th digits around the nail bed.       No results found for any visits on 05/09/24.    The ASCVD Risk score (Arnett DK, et al., 2019) failed to calculate for the following reasons:   The 2019 ASCVD risk score is only valid for ages 87 to 24   Risk score cannot be calculated because patient has a medical history suggesting prior/existing ASCVD    Assessment & Plan:  Primary hypertension -     Lisinopril ; Take 1 tablet (5 mg total) by mouth daily.  Dispense: 90 tablet; Refill: 1 -     Carvedilol ; Take 1 tablet (12.5 mg total) by mouth 2 (two) times daily with a meal.  Dispense: 180 tablet; Refill: 1  Diabetes mellitus without complication (HCC)   Assessment and Plan    Type 2 diabetes mellitus with diabetic neuropathy Diabetes is well-controlled with an A1c of 6.3. Decreased sensation in toes likely due to diabetic neuropathy. Good blood flow to feet, no swelling. Occasional pain in big toe, possibly related to neuropathy. - Perform regular foot exams - Advise wearing shoes or skid socks at all times to protect feet - Monitor blood sugar levels regularly  Metatarsalgia Pain in feet described as feeling like walking on rocks, occurring when barefoot. Advised against walking barefoot, especially for diabetics. - Advise wearing shoes or slippers with cushioning at all times  Bunion (hallux valgus) Bunion developing on foot with a bone protrusion on the opposite side. - Advise wearing  wide, supportive shoes  Onychomycosis (toenail fungal infection) Yellow, brittle toenails with chipping, consistent with longstanding fungal infection. Bleeding noted from toenails due to clipping. Discussed treatment options including oral terbinafine and topical ciclopirox. Terbinafine is preferred due to convenience and shorter treatment duration, but it may increase statin levels in the bloodstream. - Consider oral terbinafine for 12 weeks if he decides to treat - Advise careful toenail clipping to avoid skin openings - Offer referral to podiatrist for toenail care if needed  Atrial fibrillation on anticoagulation Currently on Eliquis  for atrial fibrillation. Successfully obtained medication through the [facility] with a plan to switch to a 90-day supply after initial 30 days. - Switch to 90-day supply of Eliquis  after initial 30 days  Hypertension Blood pressure well-controlled at 110/64. - Refill lisinopril   and carvedilol  prescriptions  Hyperlipidemia Currently managed with Lipitor. No issues reported with current management. - Continue Lipitor as prescribed  Cerebrovascular accident (stroke) Recent stroke with adjustments needed for balance and mobility. Neurologist has cleared him for driving. Advised to be cautious on stairs and to look at feet when descending. - Advise caution on stairs and looking at feet when descending        Return in about 6 months (around 11/07/2024) for DM, HTN.    Heron CHRISTELLA Sharper, MD

## 2024-05-16 ENCOUNTER — Ambulatory Visit

## 2024-05-16 DIAGNOSIS — I442 Atrioventricular block, complete: Secondary | ICD-10-CM

## 2024-05-19 LAB — CUP PACEART REMOTE DEVICE CHECK
Battery Remaining Longevity: 113 mo
Battery Voltage: 3.01 V
Brady Statistic AP VP Percent: 75.42 %
Brady Statistic AP VS Percent: 0.08 %
Brady Statistic AS VP Percent: 22.75 %
Brady Statistic AS VS Percent: 1.75 %
Brady Statistic RA Percent Paced: 75.45 %
Brady Statistic RV Percent Paced: 98.17 %
Date Time Interrogation Session: 20251016050107
Implantable Lead Connection Status: 753985
Implantable Lead Connection Status: 753985
Implantable Lead Implant Date: 20100125
Implantable Lead Implant Date: 20100125
Implantable Lead Location: 753859
Implantable Lead Location: 753860
Implantable Lead Model: 5076
Implantable Lead Model: 5076
Implantable Pulse Generator Implant Date: 20230407
Lead Channel Impedance Value: 342 Ohm
Lead Channel Impedance Value: 380 Ohm
Lead Channel Impedance Value: 475 Ohm
Lead Channel Impedance Value: 532 Ohm
Lead Channel Pacing Threshold Amplitude: 0.5 V
Lead Channel Pacing Threshold Amplitude: 0.75 V
Lead Channel Pacing Threshold Pulse Width: 0.4 ms
Lead Channel Pacing Threshold Pulse Width: 0.4 ms
Lead Channel Sensing Intrinsic Amplitude: 14.875 mV
Lead Channel Sensing Intrinsic Amplitude: 14.875 mV
Lead Channel Sensing Intrinsic Amplitude: 2.125 mV
Lead Channel Sensing Intrinsic Amplitude: 2.125 mV
Lead Channel Setting Pacing Amplitude: 1.5 V
Lead Channel Setting Pacing Amplitude: 2 V
Lead Channel Setting Pacing Pulse Width: 0.4 ms
Lead Channel Setting Sensing Sensitivity: 2 mV
Zone Setting Status: 755011

## 2024-05-23 NOTE — Progress Notes (Signed)
 Remote PPM Transmission

## 2024-05-25 ENCOUNTER — Ambulatory Visit: Payer: Self-pay | Admitting: Cardiology

## 2024-05-27 NOTE — Progress Notes (Signed)
 Electrophysiology Office Note:   Date:  05/27/2024  ID:  Nathan [name], DOB Oct 25, 1940, MRN [id number]  Primary Cardiologist: None Electrophysiologist: None      History of Present Illness:   Nathan [name] is a 83 y.o. male with h/o HTN, HLD, DM, TIA/CVA,  paroxysmal atrial fibrillation, CHB s/p PPM who is being seen today for follow up.  Discussed the use of AI scribe software for clinical note transcription with the patient, who gave verbal consent to proceed.  History of Present Illness Nathan [name] is an 83 year old male with a history of stroke who presents for pacemaker follow-up and management of neck pain.  He is here for a follow-up regarding his pacemaker.  He experiences neck pain located between the second and third cervical vertebrae. He has previously received injections for this pain, but it has been a while since his last treatment. Recently, the pain has become more pronounced. There is concern about stopping his anticoagulant medication, Eliquis , due to his history of stroke. He can tolerate the current level of discomfort and prefers to avoid the complexity of bridging therapy unless the pain worsens.  His past medical history is significant for a recent stroke, necessitating careful management of his anticoagulation therapy to minimize the risk of another stroke. He is currently taking Eliquis , which he receives from the [facility], and appreciates the cost savings this provides. He feels generally well and is 'basically pain free' aside from the neck issue.  In terms of family history, he shares that his grandfather was from [place] , and his family has traced their roots back to [place] . He also mentions a family history of longevity, with relatives living into their 43s and 7s.    Review of systems complete and found to be negative unless listed in HPI.   EP Information / Studies Reviewed:    EKG is not ordered today. EKG from 02/21/24 reviewed  which showed predominantly AV paced, some AS-VP.      Echo 7.22.25:   1. Left ventricular ejection fraction, by estimation, is 60 to 65%. The  left ventricle has normal function. The left ventricle has no regional  wall motion abnormalities. There is moderate asymmetric left ventricular  hypertrophy of the septal segment.  Left ventricular diastolic parameters are indeterminate.   2. Right ventricular systolic function is normal. The right ventricular  size is normal. There is normal pulmonary artery systolic pressure. The  estimated right ventricular systolic pressure is 29.6 mmHg.   3. Left atrial size was mildly dilated.   4. The mitral valve is degenerative. Trivial mitral valve regurgitation.  No evidence of mitral stenosis. The mean mitral valve gradient is 2.0 mmHg  with average heart rate of 70 bpm. Severe mitral annular calcification.   5. Unable to get gradients on today's study due to limited patient  cooperation; Visually on short axis assessment, valve consistent with  moderate aortic stenosis (mean gradient 19 mm Hg last year). The aortic  valve is calcified. There is moderate  calcification of the aortic valve. Aortic valve regurgitation is not  visualized.   6. The inferior vena cava is normal in size with greater than 50%  respiratory variability, suggesting right atrial pressure of 3 mmHg.   Risk Assessment/Calculations:    CHA2DS2-VASc Score = 6   This indicates a 9.7% annual risk of stroke. The patient's score is based upon: CHF History: 0 HTN History: 1 Diabetes History: 1 Stroke History: 2 Vascular Disease  History: 0 Age Score: 2 Gender Score: 0          Physical Exam:   VS:  There were no vitals taken for this visit.   Wt Readings from Last 3 Encounters:  05/09/24 181 lb 3.2 oz (82.2 kg)  04/23/24 188 lb (85.3 kg)  03/25/24 182 lb (82.6 kg)     GEN: Well nourished, well developed in no acute distress NECK: No JVD CARDIAC: Normal rate,  regular rhythm.  Well-healed left chest pacer pocket. RESPIRATORY:  Clear to auscultation without rales, wheezing or rhonchi  ABDOMEN: Soft, non-distended EXTREMITIES:  No edema; No deformity   ASSESSMENT AND PLAN:    #. CHB s/p pacemaker  - In-clinic device interrogation performed today.  Appropriate device function and stable lead parameters.  Battery okay.  Presenting rhythm is AP-VP.  No programming changes made. - Continue remote monitoring.  #. Paroxysmal AF: AT/AF burden is low by device.  AF episode actually detected after his stroke, possibly stress response. #. Secondary hypercoagulable state due to AF - Continue Eliquis  5 mg twice daily.   -Continue carvedilol  12.5 mg twice daily. - Monitor AF burden with device.  #. Moderate AS #. Severe MAC, no significant MS - Appears well compensated on exam.  Continue to monitor.  #. H/o CVA: His AF episode detected by his device actually occurred after his stroke.  Etiology for stroke more likely to be left internal carotid artery stenosis with high risk plaque. -Patient had CVA on 02/19/24.  Given recent stroke, will likely need neurology clearance prior to any surgeries.    #. Preoperative evaluation: Patient being considered for cervical spine injection.  There is no absolute contraindication to surgical intervention from an electrophysiology perspective.  Given recent stroke, may necessitate neurology input for clearance. -Given history of stroke and atrial fibrillation, if patient needs to hold Eliquis  for surgical intervention then he would need Lovenox  bridge and minimizing interruption of anticoagulation is much as possible.  After discussion of this, patient states that his neck pain is not severe and that he would prefer to hold off on surgical intervention at this time.   Follow up with Dr. Kennyth in 12 months   Total time of encounter: 45 minutes total time of encounter, including face-to-face patient care, coordination of  care and counseling regarding high complexity medical decision making re: pacemaker management, atrial fibrillation, history of CVA, preoperative assessment.   Signed, Fonda Kennyth, MD

## 2024-05-28 ENCOUNTER — Encounter: Payer: Self-pay | Admitting: Cardiology

## 2024-05-28 ENCOUNTER — Ambulatory Visit: Attending: Cardiology | Admitting: Cardiology

## 2024-05-28 VITALS — BP 131/79 | HR 82 | Ht 72.0 in | Wt 185.0 lb

## 2024-05-28 DIAGNOSIS — Z01810 Encounter for preprocedural cardiovascular examination: Secondary | ICD-10-CM | POA: Diagnosis not present

## 2024-05-28 DIAGNOSIS — Z950 Presence of cardiac pacemaker: Secondary | ICD-10-CM | POA: Insufficient documentation

## 2024-05-28 DIAGNOSIS — Z8673 Personal history of transient ischemic attack (TIA), and cerebral infarction without residual deficits: Secondary | ICD-10-CM | POA: Diagnosis not present

## 2024-05-28 DIAGNOSIS — I350 Nonrheumatic aortic (valve) stenosis: Secondary | ICD-10-CM | POA: Insufficient documentation

## 2024-05-28 DIAGNOSIS — D6869 Other thrombophilia: Secondary | ICD-10-CM | POA: Insufficient documentation

## 2024-05-28 DIAGNOSIS — I3481 Nonrheumatic mitral (valve) annulus calcification: Secondary | ICD-10-CM | POA: Insufficient documentation

## 2024-05-28 DIAGNOSIS — I442 Atrioventricular block, complete: Secondary | ICD-10-CM | POA: Diagnosis not present

## 2024-05-28 DIAGNOSIS — I480 Paroxysmal atrial fibrillation: Secondary | ICD-10-CM | POA: Insufficient documentation

## 2024-05-28 LAB — CUP PACEART INCLINIC DEVICE CHECK
Date Time Interrogation Session: 20251028124023
Implantable Lead Connection Status: 753985
Implantable Lead Connection Status: 753985
Implantable Lead Implant Date: 20100125
Implantable Lead Implant Date: 20100125
Implantable Lead Location: 753859
Implantable Lead Location: 753860
Implantable Lead Model: 5076
Implantable Lead Model: 5076
Implantable Pulse Generator Implant Date: 20230407

## 2024-05-28 NOTE — Patient Instructions (Signed)
 Medication Instructions:  Your physician recommends that you continue on your current medications as directed. Please refer to the Current Medication list given to you today.  *If you need a refill on your cardiac medications before your next appointment, please call your pharmacy*  Follow-Up: At [facility], you and your health needs are our priority.  As part of our continuing mission to provide you with exceptional heart care, our providers are all part of one team.  This team includes your primary Cardiologist (physician) and Advanced Practice Providers or APPs (Physician Assistants and Nurse Practitioners) who all work together to provide you with the care you need, when you need it.  Your next appointment:   1 year  Provider:   You may see one of the following Advanced Practice Providers on your designated Care Team:   [name], [place] Ozell Jodie Passey, PA-C Suzann Riddle, NP Daphne Barrack, NP Artist Pouch, PA-C

## 2024-06-02 ENCOUNTER — Ambulatory Visit: Payer: Self-pay | Admitting: Cardiology

## 2024-06-04 ENCOUNTER — Telehealth: Payer: Self-pay

## 2024-06-04 NOTE — Patient Outreach (Signed)
 First telephone outreach attempt to obtain mRS. No answer. Left message for returned call.  [facility]  Population Health Care Management Assistant  Direct Dial: [phone]  Fax: [phone] Website: [email or web address]

## 2024-06-05 ENCOUNTER — Telehealth: Payer: Self-pay

## 2024-06-05 NOTE — Patient Outreach (Signed)
 Second telephone outreach attempt to obtain mRS. No answer. Left message for returned call.  Shereen [facility]  Population Health Care Management Assistant  Direct Dial: [phone]  Fax: [phone] Website: [email or web address]

## 2024-06-06 ENCOUNTER — Telehealth: Payer: Self-pay

## 2024-06-06 NOTE — Patient Outreach (Signed)
 3 outreach attempts were completed to obtain mRs. mRs could not be obtained because patient never returned my calls. mRs=7    Shereen Gin [facility] Care Management Assistant  Direct Dial: [phone]  Fax: [phone] Website: [email or web address]

## 2024-06-08 ENCOUNTER — Ambulatory Visit: Payer: Self-pay | Admitting: Cardiology

## 2024-06-23 ENCOUNTER — Other Ambulatory Visit: Payer: Self-pay | Admitting: Family Medicine

## 2024-08-12 LAB — OPHTHALMOLOGY REPORT-SCANNED

## 2024-08-14 ENCOUNTER — Other Ambulatory Visit: Payer: Self-pay

## 2024-08-14 DIAGNOSIS — I6523 Occlusion and stenosis of bilateral carotid arteries: Secondary | ICD-10-CM

## 2024-08-15 ENCOUNTER — Ambulatory Visit

## 2024-08-15 DIAGNOSIS — I442 Atrioventricular block, complete: Secondary | ICD-10-CM | POA: Diagnosis not present

## 2024-08-15 LAB — CUP PACEART REMOTE DEVICE CHECK
Battery Remaining Longevity: 110 mo
Battery Voltage: 3.01 V
Brady Statistic AP VP Percent: 63.16 %
Brady Statistic AP VS Percent: 0.04 %
Brady Statistic AS VP Percent: 36.51 %
Brady Statistic AS VS Percent: 0.3 %
Brady Statistic RA Percent Paced: 62.86 %
Brady Statistic RV Percent Paced: 99.67 %
Date Time Interrogation Session: 20260115052658
Implantable Lead Connection Status: 753985
Implantable Lead Connection Status: 753985
Implantable Lead Implant Date: 20100125
Implantable Lead Implant Date: 20100125
Implantable Lead Location: 753859
Implantable Lead Location: 753860
Implantable Lead Model: 5076
Implantable Lead Model: 5076
Implantable Pulse Generator Implant Date: 20230407
Lead Channel Impedance Value: 323 Ohm
Lead Channel Impedance Value: 380 Ohm
Lead Channel Impedance Value: 456 Ohm
Lead Channel Impedance Value: 513 Ohm
Lead Channel Pacing Threshold Amplitude: 0.5 V
Lead Channel Pacing Threshold Amplitude: 0.75 V
Lead Channel Pacing Threshold Pulse Width: 0.4 ms
Lead Channel Pacing Threshold Pulse Width: 0.4 ms
Lead Channel Sensing Intrinsic Amplitude: 14.875 mV
Lead Channel Sensing Intrinsic Amplitude: 14.875 mV
Lead Channel Sensing Intrinsic Amplitude: 2 mV
Lead Channel Sensing Intrinsic Amplitude: 2 mV
Lead Channel Setting Pacing Amplitude: 1.5 V
Lead Channel Setting Pacing Amplitude: 2 V
Lead Channel Setting Pacing Pulse Width: 0.4 ms
Lead Channel Setting Sensing Sensitivity: 2 mV
Zone Setting Status: 755011

## 2024-08-18 ENCOUNTER — Ambulatory Visit: Payer: Self-pay | Admitting: Cardiology

## 2024-08-23 NOTE — Progress Notes (Signed)
 Remote PPM Transmission

## 2024-08-27 ENCOUNTER — Other Ambulatory Visit: Payer: Self-pay | Admitting: Family Medicine

## 2024-08-28 ENCOUNTER — Other Ambulatory Visit: Payer: Self-pay | Admitting: Family Medicine

## 2024-09-02 ENCOUNTER — Other Ambulatory Visit: Payer: Self-pay | Admitting: Family Medicine

## 2024-09-03 ENCOUNTER — Ambulatory Visit (HOSPITAL_COMMUNITY)

## 2024-09-03 ENCOUNTER — Ambulatory Visit

## 2024-11-05 ENCOUNTER — Ambulatory Visit (HOSPITAL_COMMUNITY)

## 2024-11-05 ENCOUNTER — Ambulatory Visit

## 2024-11-14 ENCOUNTER — Encounter

## 2025-01-06 ENCOUNTER — Ambulatory Visit

## 2025-02-13 ENCOUNTER — Encounter

## 2025-05-15 ENCOUNTER — Encounter
